# Patient Record
Sex: Female | Born: 1955 | Race: Black or African American | Hispanic: No | Smoking: Never smoker
Health system: Southern US, Community
[De-identification: ages and names within clinical notes are randomized; demographics above are authoritative.]

## PROBLEM LIST (undated history)

## (undated) DIAGNOSIS — D563 Thalassemia minor: Secondary | ICD-10-CM

## (undated) DIAGNOSIS — I10 Essential (primary) hypertension: Secondary | ICD-10-CM

## (undated) DIAGNOSIS — R109 Unspecified abdominal pain: Secondary | ICD-10-CM

## (undated) DIAGNOSIS — E785 Hyperlipidemia, unspecified: Secondary | ICD-10-CM

## (undated) DIAGNOSIS — D631 Anemia in chronic kidney disease: Secondary | ICD-10-CM

## (undated) HISTORY — DX: Essential (primary) hypertension: I10

## (undated) HISTORY — PX: COLONOSCOPY: SHX174

## (undated) HISTORY — DX: Thalassemia minor: D56.3

## (undated) HISTORY — DX: Anemia in chronic kidney disease: D63.1

---

## 2000-12-08 ENCOUNTER — Emergency Department (HOSPITAL_COMMUNITY): Admission: EM | Admit: 2000-12-08 | Discharge: 2000-12-08 | Payer: Self-pay | Admitting: Emergency Medicine

## 2008-04-08 ENCOUNTER — Ambulatory Visit (HOSPITAL_COMMUNITY): Admission: RE | Admit: 2008-04-08 | Discharge: 2008-04-08 | Payer: Self-pay | Admitting: *Deleted

## 2008-04-08 ENCOUNTER — Encounter (INDEPENDENT_AMBULATORY_CARE_PROVIDER_SITE_OTHER): Payer: Self-pay | Admitting: *Deleted

## 2008-04-15 ENCOUNTER — Other Ambulatory Visit: Admission: RE | Admit: 2008-04-15 | Discharge: 2008-04-15 | Payer: Self-pay | Admitting: Internal Medicine

## 2011-04-30 NOTE — Op Note (Signed)
NAMEMARETTA, [name] NO.:  [id number]   MEDICAL RECORD NO.:  [id number]          PATIENT TYPE:  AMB   LOCATION:  ENDO                         FACILITY:  [facility]   PHYSICIAN:  Waverly Ferrari, M.D.    DATE OF BIRTH:  [birth year]   DATE OF PROCEDURE:  04/08/2008  DATE OF DISCHARGE:                               OPERATIVE REPORT   PROCEDURE:  Colonoscopy.   ENDOSCOPIST:  Waverly Ferrari, M.D.   INDICATIONS:  Colon polyp, colon cancer screening.   ANESTHESIA:  Fentanyl 75 mcg, Versed 6 mg.   PROCEDURE:  With the patient mildly sedated in the left lateral  decubitus position, the Pentax videoscopic colonoscope was inserted in  the rectum and passed under direct vision with pressure applied to reach  the cecum, identified by ileocecal valve and appendiceal orifice, both  which were photographed.  From this point, the colonoscope was slowly  withdrawn, taking circumferential views of the colonic mucosa, stopping  in the rectum, where a polyp was seen, photographed and removed using  snare cautery technique, setting of 20/150 blended current, and it was  suctioned through the endoscope and withdrawn and placed in a container.  The endoscope was reinserted and placed in retroflex view to view the  anal canal from above.  The endoscope was straightened and withdrawn.  The patient's vital signs and pulse oximetry remained stable.  The  patient tolerated the procedure well without apparent complications.   FINDINGS:  Polyp of rectum and internal hemorrhoids, otherwise an  unremarkable examination.   PLAN:  Await biopsy report.  The patient will call me for results and  follow up with me as an outpatient.           ______________________________  Waverly Ferrari, M.D.     GMO/MEDQ  D:  04/08/2008  T:  04/08/2008  Job:  [id number]

## 2012-10-08 ENCOUNTER — Encounter: Payer: Self-pay | Admitting: Sports Medicine

## 2012-10-08 ENCOUNTER — Ambulatory Visit (INDEPENDENT_AMBULATORY_CARE_PROVIDER_SITE_OTHER): Payer: 59 | Admitting: Sports Medicine

## 2012-10-08 VITALS — BP 230/120 | HR 79 | Wt 171.0 lb

## 2012-10-08 DIAGNOSIS — I10 Essential (primary) hypertension: Secondary | ICD-10-CM | POA: Insufficient documentation

## 2012-10-08 DIAGNOSIS — D509 Iron deficiency anemia, unspecified: Secondary | ICD-10-CM

## 2012-10-08 DIAGNOSIS — Z0000 Encounter for general adult medical examination without abnormal findings: Secondary | ICD-10-CM | POA: Insufficient documentation

## 2012-10-08 DIAGNOSIS — Z298 Encounter for other specified prophylactic measures: Secondary | ICD-10-CM

## 2012-10-08 DIAGNOSIS — Z23 Encounter for immunization: Secondary | ICD-10-CM

## 2012-10-08 DIAGNOSIS — E785 Hyperlipidemia, unspecified: Secondary | ICD-10-CM

## 2012-10-08 DIAGNOSIS — Z299 Encounter for prophylactic measures, unspecified: Secondary | ICD-10-CM

## 2012-10-08 MED ORDER — LISINOPRIL-HYDROCHLOROTHIAZIDE 20-25 MG PO TABS: 1.0000 | ORAL_TABLET | Freq: Every day | ORAL | Status: DC

## 2012-10-08 MED ORDER — ASPIRIN EC 81 MG PO TBEC: 81.0000 mg | DELAYED_RELEASE_TABLET | Freq: Every day | ORAL | Status: DC

## 2012-10-08 NOTE — Progress Notes (Addendum)
Subjective:    CC: Establish care.   HPI: This is an exquisitely pleasant 56 year old female who I met at the [place] [facility] today. I measured her blood pressure at 230/160. She had no headache, visual changes, chest pain, shortness of breath, nausea.  Cervical cancer screening: Due for a Pap, would like a female provider to perform this.  Colon cancer screening: Colonoscopy 2 years ago, single polyp found, 5 year followup recommended.  Breast cancer screening: Due for a mammogram.  Past medical history, Surgical history, Family history, Social history, Allergies, and medications have been entered into the medical record, reviewed, and no changes needed.   Review of Systems: No headache, visual changes, nausea, vomiting, diarrhea, constipation, dizziness, abdominal pain, skin rash, fevers, chills, night sweats, swollen lymph nodes, weight loss, chest pain, body aches, joint swelling, muscle aches, or shortness of breath.   Objective:    General: Well Developed, well nourished, and in no acute distress.  Neuro: Alert and oriented x3, extra-ocular muscles intact.  HEENT: Normocephalic, atraumatic, pupils equal round reactive to light, neck supple, no masses, no lymphadenopathy, thyroid nonpalpable.  Skin: Warm and dry, no rashes noted.  Cardiac: Regular rate and rhythm, no murmurs rubs.  S4 gallop present.  2+ pitting edema present in both lower extremities, symmetric. Respiratory: Clear to auscultation bilaterally. Not using accessory muscles, speaking in full sentences.  Abdominal: Soft, nontender, nondistended, positive bowel sounds, no masses, no organomegaly.  Musculoskeletal: Shoulder, elbow, wrist, hip, knee, ankle stable, and with full range of motion.  Impression and Recommendations:

## 2012-10-08 NOTE — Assessment & Plan Note (Signed)
Declines flu shot, Tdap given today. We'll set her up for mammogram, Pap smear with my partner Iran Planas, PA-C. Up-to-date on colonoscopy, done 2 years ago with a recommend five-year followup.

## 2012-10-08 NOTE — Assessment & Plan Note (Signed)
Highly elevated, but no signs or symptoms of endorgan damage. This makes this a hypertensive urgency, and not an emergency. We'll start lisinopril/hydrochlorothiazide combo. Aspirin daily. Checking CBC, CMET, TSH, lipids.

## 2012-10-13 ENCOUNTER — Ambulatory Visit (INDEPENDENT_AMBULATORY_CARE_PROVIDER_SITE_OTHER): Payer: 59

## 2012-10-13 DIAGNOSIS — Z1231 Encounter for screening mammogram for malignant neoplasm of breast: Secondary | ICD-10-CM

## 2012-10-13 DIAGNOSIS — R928 Other abnormal and inconclusive findings on diagnostic imaging of breast: Secondary | ICD-10-CM

## 2012-10-16 ENCOUNTER — Other Ambulatory Visit: Payer: Self-pay | Admitting: Sports Medicine

## 2012-10-16 ENCOUNTER — Encounter: Payer: Self-pay | Admitting: Sports Medicine

## 2012-10-16 ENCOUNTER — Ambulatory Visit (INDEPENDENT_AMBULATORY_CARE_PROVIDER_SITE_OTHER): Payer: 59 | Admitting: Sports Medicine

## 2012-10-16 ENCOUNTER — Encounter: Payer: Self-pay | Admitting: *Deleted

## 2012-10-16 VITALS — BP 190/103 | HR 72 | Wt 174.0 lb

## 2012-10-16 DIAGNOSIS — I10 Essential (primary) hypertension: Secondary | ICD-10-CM

## 2012-10-16 DIAGNOSIS — R928 Other abnormal and inconclusive findings on diagnostic imaging of breast: Secondary | ICD-10-CM

## 2012-10-16 DIAGNOSIS — N63 Unspecified lump in unspecified breast: Secondary | ICD-10-CM | POA: Insufficient documentation

## 2012-10-16 MED ORDER — AMLODIPINE BESYLATE 5 MG PO TABS: 5.0000 mg | ORAL_TABLET | Freq: Every day | ORAL | Status: DC

## 2012-10-16 NOTE — Assessment & Plan Note (Signed)
We will ensure that followup imaging will be performed.

## 2012-10-16 NOTE — Assessment & Plan Note (Signed)
Improved, and medicines not yet been taken today. We still need to add amlodipine, we'll start at 5 mg. Return to clinic in 2 weeks.

## 2012-10-16 NOTE — Progress Notes (Signed)
Subjective:    CC: Followup  HPI: Hypertension: Has been on lisinopril/hydrochlorothiazide for 2 weeks, blood pressure has improved significantly from 123456 systolic to 99991111 systolic, and she's not yet taken her medicine this morning.  She remains asymptomatic.  Left breast mass: Noted on screening mammogram, patient tells [place] that she's not been made aware of this, and has not yet had followup testing set up.  Past medical history, Surgical history, Family history, Social history, Allergies, and medications have been entered into the medical record, reviewed, and no changes needed.   Review of Systems: No fevers, chills, night sweats, weight loss, chest pain, or shortness of breath.   Objective:    General: Well Developed, well nourished, and in no acute distress.  Neuro: Alert and oriented x3, extra-ocular muscles intact.  HEENT: Normocephalic, atraumatic, pupils equal round reactive to light, neck supple, no masses, no lymphadenopathy, thyroid nonpalpable.  Skin: Warm and dry, no rashes. Cardiac: Regular rate and rhythm, no murmurs rubs or gallops.  Respiratory: Clear to auscultation bilaterally. Not using accessory muscles, speaking in full sentences. Still has 1+ pitting edema in the lower extremities which is significantly improved from before.  Impression and Recommendations:

## 2012-10-17 LAB — COMPREHENSIVE METABOLIC PANEL WITH GFR
ALT: <8 U/L (ref 0–35)
AST: 15 U/L (ref 0–37)
Alkaline Phosphatase: 85 U/L (ref 39–117)
Sodium: 140 meq/L (ref 135–145)
Total Bilirubin: 0.3 mg/dL (ref 0.3–1.2)
Total Protein: 7 g/dL (ref 6.0–8.3)

## 2012-10-17 LAB — COMPREHENSIVE METABOLIC PANEL
Albumin: 3.7 g/dL (ref 3.5–5.2)
BUN: 21 mg/dL (ref 6–23)
CO2: 31 mEq/L (ref 19–32)
Calcium: 9.4 mg/dL (ref 8.4–10.5)
Chloride: 101 mEq/L (ref 96–112)
Creat: 0.79 mg/dL (ref 0.50–1.10)
Glucose, Bld: 86 mg/dL (ref 70–99)
Potassium: 3.7 mEq/L (ref 3.5–5.3)

## 2012-10-17 LAB — LIPID PANEL
Cholesterol: 210 mg/dL — ABNORMAL HIGH (ref 0–200)
HDL: 41 mg/dL (ref >39)
LDL Cholesterol: 153 mg/dL — ABNORMAL HIGH (ref 0–99)
Total CHOL/HDL Ratio: 5.1 Ratio
Triglycerides: 81 mg/dL (ref <150)
VLDL: 16 mg/dL (ref 0–40)

## 2012-10-17 LAB — CBC
HCT: 32.8 % — ABNORMAL LOW (ref 36.0–46.0)
Hemoglobin: 10.4 g/dL — ABNORMAL LOW (ref 12.0–15.0)
MCH: 21.8 pg — ABNORMAL LOW (ref 26.0–34.0)
MCHC: 31.7 g/dL (ref 30.0–36.0)
MCV: 68.8 fL — ABNORMAL LOW (ref 78.0–100.0)
Platelets: 309 K/uL (ref 150–400)
RBC: 4.77 MIL/uL (ref 3.87–5.11)
RDW: 14.4 % (ref 11.5–15.5)
WBC: 9 10*3/uL (ref 4.0–10.5)

## 2012-10-17 LAB — TSH: TSH: 2.075 u[IU]/mL (ref 0.350–4.500)

## 2012-10-18 DIAGNOSIS — E785 Hyperlipidemia, unspecified: Secondary | ICD-10-CM | POA: Insufficient documentation

## 2012-10-18 DIAGNOSIS — D509 Iron deficiency anemia, unspecified: Secondary | ICD-10-CM | POA: Insufficient documentation

## 2012-10-18 MED ORDER — ATORVASTATIN CALCIUM 40 MG PO TABS: 40.0000 mg | ORAL_TABLET | Freq: Every day | ORAL | Status: DC

## 2012-10-18 MED ORDER — FERROUS SULFATE 325 (65 FE) MG PO TBEC: 325.0000 mg | DELAYED_RELEASE_TABLET | Freq: Three times a day (TID) | ORAL | Status: DC

## 2012-10-18 NOTE — Addendum Note (Signed)
Addended by: Silverio Decamp on: 10/18/2012 09:52 AM   Modules accepted: Orders

## 2012-10-18 NOTE — Assessment & Plan Note (Signed)
Starting lipitor 40. Recheck in 6 weeks.

## 2012-10-18 NOTE — Assessment & Plan Note (Signed)
Will set up repeat colonoscopy 2/2 iron def anemia in a postmenopausal female.

## 2012-10-22 ENCOUNTER — Other Ambulatory Visit: Payer: 59

## 2012-10-26 ENCOUNTER — Other Ambulatory Visit: Payer: Self-pay | Admitting: Internal Medicine

## 2012-10-30 ENCOUNTER — Ambulatory Visit: Payer: 59 | Admitting: Sports Medicine

## 2012-10-30 DIAGNOSIS — Z0289 Encounter for other administrative examinations: Secondary | ICD-10-CM

## 2012-11-02 ENCOUNTER — Encounter: Payer: 59 | Admitting: Physician Assistant

## 2012-11-03 ENCOUNTER — Encounter: Payer: Self-pay | Admitting: Sports Medicine

## 2012-11-03 ENCOUNTER — Ambulatory Visit
Admission: RE | Admit: 2012-11-03 | Discharge: 2012-11-03 | Disposition: A | Discharge status: Home / Self Care | Payer: 59 | Source: Ambulatory Visit | Attending: Sports Medicine | Admitting: Sports Medicine

## 2012-11-03 DIAGNOSIS — R928 Other abnormal and inconclusive findings on diagnostic imaging of breast: Secondary | ICD-10-CM

## 2012-11-09 ENCOUNTER — Encounter: Payer: Self-pay | Admitting: Physician Assistant

## 2012-11-09 ENCOUNTER — Ambulatory Visit (INDEPENDENT_AMBULATORY_CARE_PROVIDER_SITE_OTHER): Payer: 59 | Admitting: Physician Assistant

## 2012-11-09 ENCOUNTER — Other Ambulatory Visit (HOSPITAL_COMMUNITY)
Admission: RE | Admit: 2012-11-09 | Discharge: 2012-11-09 | Disposition: A | Discharge status: Home / Self Care | Payer: 59 | Source: Ambulatory Visit | Attending: Sports Medicine | Admitting: Sports Medicine

## 2012-11-09 VITALS — BP 149/88 | HR 77 | Wt 169.0 lb

## 2012-11-09 DIAGNOSIS — I10 Essential (primary) hypertension: Secondary | ICD-10-CM

## 2012-11-09 DIAGNOSIS — Z01419 Encounter for gynecological examination (general) (routine) without abnormal findings: Secondary | ICD-10-CM

## 2012-11-09 NOTE — Progress Notes (Signed)
  Subjective:    Patient ID: [name], female    DOB: [birth year], 56 y.o.   MRN: [id number]  HPI    Review of Systems     Objective:   Physical Exam        Assessment & Plan:   Subjective:     [name] is a 56 y.o. female and is here for a comprehensive physical exam. The patient reports no problems.  History   Social History  . Marital Status: Divorced    Spouse Name: N/A    Number of Children: N/A  . Years of Education: N/A   Occupational History  . Not on file.   Social History Main Topics  . Smoking status: Never Smoker   . Smokeless tobacco: Not on file  . Alcohol Use: No  . Drug Use: No  . Sexually Active: No   Other Topics Concern  . Not on file   Social History Narrative  . No narrative on file   Health Maintenance  Topic Date Due  . Pap Smear  02/17/1972  . Mammogram  05/03/2013  . Colonoscopy  12/16/2014  . Tetanus/tdap  10/08/2022    The following portions of the patient's history were reviewed and updated as appropriate: allergies, current medications, past family history, past medical history, past social history, past surgical history and problem list.  Review of Systems A comprehensive review of systems was negative.   Objective:    BP 149/88  Pulse 77  Wt 169 lb (76.658 kg) General appearance: alert, cooperative and appears stated age Head: Normocephalic, without obvious abnormality, atraumatic Eyes: conjunctivae/corneas clear. PERRL, EOM's intact. Fundi benign. Ears: normal TM's and external ear canals both ears Nose: Nares normal. Septum midline. Mucosa normal. No drainage or sinus tenderness. Throat: lips, mucosa, and tongue normal; teeth and gums normal Neck: no adenopathy, no carotid bruit, no JVD, supple, symmetrical, trachea midline and thyroid not enlarged, symmetric, no tenderness/mass/nodules Back: symmetric, no curvature. ROM normal. No CVA tenderness. Lungs: clear to auscultation bilaterally Heart: regular rate  and rhythm, S1, S2 normal, no murmur, click, rub or gallop Abdomen: soft, non-tender; bowel sounds normal; no masses,  no organomegaly Pelvic: cervix normal in appearance, external genitalia normal, no adnexal masses or tenderness, no cervical motion tenderness, uterus normal size, shape, and consistency and vagina normal without discharge Extremities: extremities normal, atraumatic, no cyanosis or edema Pulses: 2+ and symmetric Skin: Skin color, texture, turgor normal. No rashes or lesions Lymph nodes: Cervical, supraclavicular, and axillary nodes normal. Neurologic: Grossly normal    Assessment:    Healthy female exam.      Plan:    CPE/Pap/HTN- All vaccines up to date. Declines flu shot. Labs have already been done and being followed by [name]. BP continues to be high but much better. Increased patient to 1 and 1/2 tab of norvasc daily(7.5mg ). Follow up with [name] in 4 weeks. Reminder to stay on a low salt diet and regular exercise. Encouraged calcium daily 4 servings or 500mg  BID. Pap done today and will call patient with results.  See After Visit Summary for Counseling Recommendations

## 2012-11-09 NOTE — Patient Instructions (Addendum)
Increase Norvasc to 1 and 1/2 tab daily. Follow up in 4 weeks for blood pressure check with Dr. Darene Lamer.   Will call with pap results.

## 2012-11-10 ENCOUNTER — Telehealth: Payer: Self-pay

## 2012-11-10 NOTE — Telephone Encounter (Signed)
Called patient and gave her the number to [facility]. Advised her to call and schedule an appointment with them for consult/colonoscopy. She voiced understanding.

## 2012-12-07 ENCOUNTER — Ambulatory Visit (INDEPENDENT_AMBULATORY_CARE_PROVIDER_SITE_OTHER): Payer: 59 | Admitting: Sports Medicine

## 2012-12-07 ENCOUNTER — Encounter: Payer: Self-pay | Admitting: Sports Medicine

## 2012-12-07 VITALS — BP 149/95 | HR 78 | Wt 167.0 lb

## 2012-12-07 DIAGNOSIS — N63 Unspecified lump in unspecified breast: Secondary | ICD-10-CM

## 2012-12-07 DIAGNOSIS — I10 Essential (primary) hypertension: Secondary | ICD-10-CM

## 2012-12-07 DIAGNOSIS — Z299 Encounter for prophylactic measures, unspecified: Secondary | ICD-10-CM

## 2012-12-07 DIAGNOSIS — E785 Hyperlipidemia, unspecified: Secondary | ICD-10-CM

## 2012-12-07 MED ORDER — AMLODIPINE BESYLATE 10 MG PO TABS: 10.0000 mg | ORAL_TABLET | Freq: Every day | ORAL | Status: DC

## 2012-12-07 NOTE — Assessment & Plan Note (Signed)
Up-to-date on colonoscopy, Pap smear.

## 2012-12-07 NOTE — Assessment & Plan Note (Signed)
She has been on atorvastatin for 6 weeks at least. It is time to recheck lipids, and adjust medicine as needed.

## 2012-12-07 NOTE — Assessment & Plan Note (Signed)
As above and overview, needs repeat ultrasound in 6 months, this will be May 2014

## 2012-12-07 NOTE — Assessment & Plan Note (Signed)
Much improved. I am going to increase Norvasc to 10 mg daily.

## 2012-12-07 NOTE — Progress Notes (Signed)
Subjective:    CC: Followup  HPI: Hypertension: Currently on lisinopril/hydrochlorothiazide at the max dose, she is also taking amlodipine at the 7.5 mg dose.  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  Colon cancer screening: Does have a followup visit with her gastroenterologist coming up.  Hyperlipidemia: Doing well with atorvastatin, no adverse effects.  Past medical history, Surgical history, Family history, Social history, Allergies, and medications have been entered into the medical record, reviewed, and no changes needed.   Review of Systems: No fevers, chills, night sweats, weight loss, chest pain, or shortness of breath.   Objective:    General: Well Developed, well nourished, and in no acute distress.  Neuro: Alert and oriented x3, extra-ocular muscles intact.  HEENT: Normocephalic, atraumatic, pupils equal round reactive to light, neck supple, no masses, no lymphadenopathy, thyroid nonpalpable.  Skin: Warm and dry, no rashes. Cardiac: Regular rate and rhythm, no murmurs rubs or gallops.  Respiratory: Clear to auscultation bilaterally. Not using accessory muscles, speaking in full sentences.   Impression and Recommendations:

## 2012-12-21 ENCOUNTER — Ambulatory Visit: Payer: 59 | Admitting: Sports Medicine

## 2013-02-18 ENCOUNTER — Ambulatory Visit (INDEPENDENT_AMBULATORY_CARE_PROVIDER_SITE_OTHER): Payer: 59 | Admitting: Sports Medicine

## 2013-02-18 ENCOUNTER — Ambulatory Visit (HOSPITAL_BASED_OUTPATIENT_CLINIC_OR_DEPARTMENT_OTHER)
Admission: RE | Admit: 2013-02-18 | Discharge: 2013-02-18 | Disposition: A | Discharge status: Home / Self Care | Payer: 59 | Source: Ambulatory Visit | Attending: Sports Medicine | Admitting: Sports Medicine

## 2013-02-18 ENCOUNTER — Encounter: Payer: Self-pay | Admitting: Sports Medicine

## 2013-02-18 ENCOUNTER — Ambulatory Visit: Payer: 59

## 2013-02-18 DIAGNOSIS — M25539 Pain in unspecified wrist: Secondary | ICD-10-CM | POA: Insufficient documentation

## 2013-02-18 DIAGNOSIS — M546 Pain in thoracic spine: Secondary | ICD-10-CM | POA: Insufficient documentation

## 2013-02-18 DIAGNOSIS — IMO0002 Reserved for concepts with insufficient information to code with codable children: Secondary | ICD-10-CM

## 2013-02-18 DIAGNOSIS — I10 Essential (primary) hypertension: Secondary | ICD-10-CM

## 2013-02-18 DIAGNOSIS — M25519 Pain in unspecified shoulder: Secondary | ICD-10-CM | POA: Insufficient documentation

## 2013-02-18 DIAGNOSIS — S60219A Contusion of unspecified wrist, initial encounter: Secondary | ICD-10-CM

## 2013-02-18 MED ORDER — MELOXICAM 15 MG PO TABS: ORAL_TABLET | ORAL | Status: DC

## 2013-02-18 NOTE — Assessment & Plan Note (Signed)
We will revisit this when not in pain.
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
## 2013-03-01 ENCOUNTER — Telehealth: Payer: Self-pay

## 2013-03-01 NOTE — Telephone Encounter (Signed)
CORRENA UMEDA wants [place] to fax the x-ray report and office note to [employer].  Faxed records.

## 2013-03-04 ENCOUNTER — Encounter: Payer: Self-pay | Admitting: Sports Medicine

## 2013-03-04 ENCOUNTER — Ambulatory Visit (INDEPENDENT_AMBULATORY_CARE_PROVIDER_SITE_OTHER): Payer: 59 | Admitting: Sports Medicine

## 2013-03-04 VITALS — BP 113/72 | HR 89 | Wt 160.0 lb

## 2013-03-04 DIAGNOSIS — N63 Unspecified lump in unspecified breast: Secondary | ICD-10-CM

## 2013-03-04 DIAGNOSIS — I10 Essential (primary) hypertension: Secondary | ICD-10-CM

## 2013-03-04 DIAGNOSIS — E785 Hyperlipidemia, unspecified: Secondary | ICD-10-CM

## 2013-03-04 MED ORDER — ASPIRIN EC 81 MG PO TBEC: 81.0000 mg | DELAYED_RELEASE_TABLET | Freq: Every day | ORAL | Status: DC

## 2013-03-04 MED ORDER — ATORVASTATIN CALCIUM 40 MG PO TABS: 40.0000 mg | ORAL_TABLET | Freq: Every day | ORAL | Status: DC

## 2013-03-04 MED ORDER — LISINOPRIL-HYDROCHLOROTHIAZIDE 20-25 MG PO TABS: 1.0000 | ORAL_TABLET | Freq: Every day | ORAL | Status: DC

## 2013-03-04 MED ORDER — AMLODIPINE BESYLATE 10 MG PO TABS: 10.0000 mg | ORAL_TABLET | Freq: Every day | ORAL | Status: DC

## 2013-03-04 NOTE — Assessment & Plan Note (Signed)
Pain has resolved.

## 2013-03-04 NOTE — Assessment & Plan Note (Signed)
Beautifully controlled. Refilling with three-month supplies. Return in 3-4 months.
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
## 2013-03-04 NOTE — Assessment & Plan Note (Signed)
Stable, needs repeat ultrasound May 2014.

## 2013-03-04 NOTE — Assessment & Plan Note (Signed)
Currently on high-dose statin. Per new recommendations, no need to recheck lipids.

## 2013-06-03 ENCOUNTER — Other Ambulatory Visit: Payer: Self-pay

## 2013-06-03 ENCOUNTER — Other Ambulatory Visit: Payer: Self-pay | Admitting: Sports Medicine

## 2013-06-03 DIAGNOSIS — N63 Unspecified lump in unspecified breast: Secondary | ICD-10-CM

## 2013-06-04 ENCOUNTER — Ambulatory Visit: Payer: 59 | Admitting: Sports Medicine

## 2013-06-17 ENCOUNTER — Other Ambulatory Visit: Payer: 59

## 2013-06-21 ENCOUNTER — Other Ambulatory Visit: Payer: 59

## 2013-06-22 ENCOUNTER — Ambulatory Visit
Admission: RE | Admit: 2013-06-22 | Discharge: 2013-06-22 | Disposition: A | Discharge status: Home / Self Care | Payer: 59 | Source: Ambulatory Visit | Attending: Sports Medicine | Admitting: Sports Medicine

## 2013-06-22 DIAGNOSIS — N63 Unspecified lump in unspecified breast: Secondary | ICD-10-CM

## 2013-09-01 ENCOUNTER — Other Ambulatory Visit (HOSPITAL_COMMUNITY): Payer: Self-pay | Admitting: Orthopedic Surgery

## 2013-09-01 DIAGNOSIS — M25531 Pain in right wrist: Secondary | ICD-10-CM

## 2013-09-08 ENCOUNTER — Encounter (HOSPITAL_COMMUNITY): Payer: Self-pay

## 2013-09-08 ENCOUNTER — Encounter (HOSPITAL_COMMUNITY)
Admission: RE | Admit: 2013-09-08 | Discharge: 2013-09-08 | Disposition: A | Discharge status: Home / Self Care | Payer: 59 | Source: Ambulatory Visit | Attending: Orthopedic Surgery | Admitting: Orthopedic Surgery

## 2013-09-08 ENCOUNTER — Encounter (HOSPITAL_COMMUNITY): Payer: 59

## 2013-09-08 DIAGNOSIS — M25531 Pain in right wrist: Secondary | ICD-10-CM

## 2013-09-08 DIAGNOSIS — M25539 Pain in unspecified wrist: Secondary | ICD-10-CM | POA: Insufficient documentation

## 2013-09-08 MED ORDER — TECHNETIUM TC 99M MEDRONATE IV KIT
25.0000 | PACK | Freq: Once | INTRAVENOUS | Status: AC | PRN
  Administered 2013-09-08: 25 via INTRAVENOUS

## 2013-12-29 ENCOUNTER — Other Ambulatory Visit: Payer: Self-pay | Admitting: Orthopedic Surgery

## 2013-12-29 DIAGNOSIS — M79646 Pain in unspecified finger(s): Secondary | ICD-10-CM

## 2014-01-06 ENCOUNTER — Other Ambulatory Visit: Payer: 59

## 2014-01-14 ENCOUNTER — Other Ambulatory Visit: Payer: 59

## 2014-01-16 ENCOUNTER — Ambulatory Visit
Admission: RE | Admit: 2014-01-16 | Discharge: 2014-01-16 | Disposition: A | Discharge status: Home / Self Care | Payer: 59 | Source: Ambulatory Visit | Attending: Orthopedic Surgery | Admitting: Orthopedic Surgery

## 2014-01-16 DIAGNOSIS — M79646 Pain in unspecified finger(s): Secondary | ICD-10-CM

## 2014-01-26 ENCOUNTER — Other Ambulatory Visit: Payer: Self-pay | Admitting: Sports Medicine

## 2014-01-26 DIAGNOSIS — N6320 Unspecified lump in the left breast, unspecified quadrant: Secondary | ICD-10-CM

## 2014-02-08 ENCOUNTER — Other Ambulatory Visit: Payer: 59

## 2014-02-18 ENCOUNTER — Ambulatory Visit
Admission: RE | Admit: 2014-02-18 | Discharge: 2014-02-18 | Disposition: A | Discharge status: Home / Self Care | Payer: 59 | Source: Ambulatory Visit | Attending: Sports Medicine | Admitting: Sports Medicine

## 2014-02-18 DIAGNOSIS — N6320 Unspecified lump in the left breast, unspecified quadrant: Secondary | ICD-10-CM

## 2014-09-21 ENCOUNTER — Other Ambulatory Visit: Payer: Self-pay | Admitting: Sports Medicine

## 2015-03-31 ENCOUNTER — Other Ambulatory Visit: Payer: Self-pay | Admitting: Sports Medicine

## 2016-01-26 ENCOUNTER — Encounter (HOSPITAL_COMMUNITY): Payer: Self-pay

## 2016-01-26 ENCOUNTER — Emergency Department (HOSPITAL_COMMUNITY)
Admission: EM | Admit: 2016-01-26 | Discharge: 2016-01-26 | Disposition: A | Discharge status: Home / Self Care | Payer: 59 | Attending: Emergency Medicine | Admitting: Emergency Medicine

## 2016-01-26 DIAGNOSIS — E785 Hyperlipidemia, unspecified: Secondary | ICD-10-CM | POA: Insufficient documentation

## 2016-01-26 DIAGNOSIS — IMO0001 Reserved for inherently not codable concepts without codable children: Secondary | ICD-10-CM

## 2016-01-26 DIAGNOSIS — R040 Epistaxis: Secondary | ICD-10-CM | POA: Diagnosis not present

## 2016-01-26 DIAGNOSIS — R519 Headache, unspecified: Secondary | ICD-10-CM

## 2016-01-26 DIAGNOSIS — R51 Headache: Secondary | ICD-10-CM | POA: Insufficient documentation

## 2016-01-26 DIAGNOSIS — R030 Elevated blood-pressure reading, without diagnosis of hypertension: Secondary | ICD-10-CM

## 2016-01-26 DIAGNOSIS — R110 Nausea: Secondary | ICD-10-CM | POA: Diagnosis not present

## 2016-01-26 DIAGNOSIS — Z79899 Other long term (current) drug therapy: Secondary | ICD-10-CM | POA: Insufficient documentation

## 2016-01-26 DIAGNOSIS — I10 Essential (primary) hypertension: Secondary | ICD-10-CM | POA: Diagnosis not present

## 2016-01-26 HISTORY — DX: Hyperlipidemia, unspecified: E78.5

## 2016-01-26 LAB — URINE MICROSCOPIC-ADD ON

## 2016-01-26 LAB — URINALYSIS, ROUTINE W REFLEX MICROSCOPIC
BILIRUBIN URINE: NEGATIVE
Glucose, UA: NEGATIVE mg/dL
KETONES UR: NEGATIVE mg/dL
NITRITE: NEGATIVE
PROTEIN: NEGATIVE mg/dL
Specific Gravity, Urine: 1.003 — ABNORMAL LOW (ref 1.005–1.030)
pH: 6.5 (ref 5.0–8.0)

## 2016-01-26 MED ORDER — LISINOPRIL-HYDROCHLOROTHIAZIDE 20-25 MG PO TABS: 1.0000 | ORAL_TABLET | Freq: Every day | ORAL | Status: DC

## 2016-01-26 MED ORDER — ACETAMINOPHEN 325 MG PO TABS
650.0000 mg | ORAL_TABLET | Freq: Once | ORAL | Status: AC
  Administered 2016-01-26: 650 mg via ORAL
  Filled 2016-01-26: qty 2

## 2016-01-26 MED ORDER — DIPHENHYDRAMINE HCL 25 MG PO CAPS
25.0000 mg | ORAL_CAPSULE | Freq: Once | ORAL | Status: AC
  Administered 2016-01-26: 25 mg via ORAL
  Filled 2016-01-26: qty 1

## 2016-01-26 MED ORDER — AMLODIPINE BESYLATE 5 MG PO TABS
10.0000 mg | ORAL_TABLET | Freq: Once | ORAL | Status: AC
  Administered 2016-01-26: 10 mg via ORAL
  Filled 2016-01-26: qty 2

## 2016-01-26 MED ORDER — LISINOPRIL 20 MG PO TABS
20.0000 mg | ORAL_TABLET | Freq: Once | ORAL | Status: AC
  Administered 2016-01-26: 20 mg via ORAL
  Filled 2016-01-26: qty 1

## 2016-01-26 MED ORDER — METOCLOPRAMIDE HCL 5 MG/ML IJ SOLN
5.0000 mg | Freq: Once | INTRAMUSCULAR | Status: AC
  Administered 2016-01-26: 5 mg via INTRAMUSCULAR
  Filled 2016-01-26: qty 2

## 2016-01-26 MED ORDER — IBUPROFEN 800 MG PO TABS
800.0000 mg | ORAL_TABLET | Freq: Once | ORAL | Status: AC
  Administered 2016-01-26: 800 mg via ORAL
  Filled 2016-01-26: qty 1

## 2016-01-26 MED ORDER — HYDROCHLOROTHIAZIDE 25 MG PO TABS
25.0000 mg | ORAL_TABLET | Freq: Once | ORAL | Status: AC
  Administered 2016-01-26: 25 mg via ORAL
  Filled 2016-01-26: qty 1

## 2016-01-26 MED ORDER — ONDANSETRON 4 MG PO TBDP
8.0000 mg | ORAL_TABLET | Freq: Once | ORAL | Status: AC
  Administered 2016-01-26: 8 mg via ORAL
  Filled 2016-01-26: qty 2

## 2016-01-26 MED ORDER — AMLODIPINE BESYLATE 10 MG PO TABS: 10.0000 mg | ORAL_TABLET | Freq: Every day | ORAL | Status: DC

## 2016-01-26 NOTE — ED Provider Notes (Signed)
CSN: [id number]     Arrival date & time 01/26/16  [id number] History   First MD Initiated Contact with Patient 01/26/16 [phone]     Chief Complaint  Patient presents with  . Headache  . Epistaxis  . Nausea     (Consider location/radiation/quality/duration/timing/severity/associated sxs/prior Treatment) HPI   [name] is a 60 y.o. female with PMH significant for HTN and HLD who presents with elevated BP, headache, and epistaxis x 1 day.  Patient reports she has been out of her blood pressure medications (amlodipine 10 mg and lisinopril-HCTZ 20-25mg ) for the past month.  Patient complains of gradual onset, constant, throbbing, generalized headache.  No meds PTA.  No head injury/trauma.  Aggravating factors include bending over.  Denies CP, SOB, abdominal pain, fever, vomiting, urinary symptoms, slurred speech, facial droop, unilateral weakness, or photophobia.    Past Medical History  Diagnosis Date  . Hypertension   . Hyperlipidemia    History reviewed. No pertinent past surgical history. Family History  Problem Relation Age of Onset  . Stroke Mother   . Hyperlipidemia Mother   . Diabetes Mother    Social History  Substance Use Topics  . Smoking status: Never Smoker   . Smokeless tobacco: None  . Alcohol Use: No   OB History    Gravida Para Term Preterm AB TAB SAB Ectopic Multiple Living   2 1   1           Review of Systems All other systems negative unless otherwise stated in HPI    Allergies  Review of patient's allergies indicates no known allergies.  Home Medications   Prior to Admission medications   Medication Sig Start Date End Date Taking? Authorizing Provider  amLODipine (NORVASC) 10 MG tablet TAKE ONE TABLET BY MOUTH ONCE DAILY Patient not taking: Reported on 01/26/2016 09/21/14   Silverio Decamp, MD  aspirin EC 81 MG tablet Take 1 tablet (81 mg total) by mouth daily. Patient not taking: Reported on 01/26/2016 03/04/13   Silverio Decamp, MD   atorvastatin (LIPITOR) 40 MG tablet TAKE ONE TABLET BY MOUTH ONCE DAILY Patient not taking: Reported on 01/26/2016 09/21/14   Silverio Decamp, MD  ferrous sulfate 325 (65 FE) MG tablet TAKE ONE TABLET BY MOUTH THREE TIMES DAILY WITH MEALS Patient not taking: Reported on 01/26/2016 03/31/15   Silverio Decamp, MD  lisinopril-hydrochlorothiazide (PRINZIDE,ZESTORETIC) 20-25 MG per tablet Take 1 tablet by mouth daily. Patient not taking: Reported on 01/26/2016 03/04/13   Silverio Decamp, MD  meloxicam (MOBIC) 15 MG tablet One tab PO qAM with breakfast for 2 weeks, then daily prn pain. Patient not taking: Reported on 01/26/2016 02/18/13   Silverio Decamp, MD   BP 164/91 mmHg  Pulse 98  Temp(Src) 97.3 F (36.3 C) (Oral)  Resp 16  Ht 5\' 1"  (1.549 m)  Wt 77.111 kg  BMI 32.14 kg/m2  SpO2 91% Physical Exam  Constitutional: She is oriented to person, place, and time. She appears well-developed and well-nourished.  Non-toxic appearance. She does not have a sickly appearance. She does not appear ill.  HENT:  Head: Normocephalic and atraumatic.  Nose: No epistaxis.  Mouth/Throat: Oropharynx is clear and moist.  No active epistaxis.  Areas of dried blood within nares bilaterally.   Eyes: Conjunctivae are normal. Pupils are equal, round, and reactive to light.  Neck: Normal range of motion. Neck supple.  Cardiovascular: Normal rate, regular rhythm and normal heart sounds.   No murmur heard. Pulmonary/Chest:  Effort normal and breath sounds normal. No accessory muscle usage or stridor. No respiratory distress. She has no wheezes. She has no rhonchi. She has no rales.  Abdominal: Soft. Bowel sounds are normal. She exhibits no distension. There is no tenderness.  Musculoskeletal: Normal range of motion.  Lymphadenopathy:    She has no cervical adenopathy.  Neurological: She is alert and oriented to person, place, and time.  Mental Status:   AOx3.  Speech clear without  dysarthria. Cranial Nerves:  I-not tested  II-PERRLA  III, IV, VI-EOMs intact  V-temporal and masseter strength intact  VII-symmetrical facial movements intact, no facial droop  VIII-hearing grossly intact bilaterally  IX, X-gag intact  XI-strength of sternomastoid and trapezius muscles 5/5  XII-tongue midline Motor:   Good muscle bulk and tone  Strength 5/5 bilaterally in upper and lower extremities   Cerebellar--intact RAMs, finger to nose intact bilaterally.  Gait normal  No pronator drift Sensory:  Intact in upper and lower extremities   Skin: Skin is warm and dry.  Psychiatric: She has a normal mood and affect. Her behavior is normal.    ED Course  Procedures (including critical care time) Labs Review Labs Reviewed  URINALYSIS, ROUTINE W REFLEX MICROSCOPIC (NOT AT [facility]) - Abnormal; Notable for the following:    Color, Urine STRAW (*)    Specific Gravity, Urine 1.003 (*)    Hgb urine dipstick TRACE (*)    Leukocytes, UA SMALL (*)    All other components within normal limits  URINE MICROSCOPIC-ADD ON - Abnormal; Notable for the following:    Squamous Epithelial / LPF 0-5 (*)    Bacteria, UA RARE (*)    All other components within normal limits    Imaging Review No results found. I have personally reviewed and evaluated these images and lab results as part of my medical decision-making.   EKG Interpretation   Date/Time:  Friday January 26 2016 09:11:05 EST Ventricular Rate:  89 PR Interval:  157 QRS Duration: 80 QT Interval:  362 QTC Calculation: 440 R Axis:   65 Text Interpretation:  Sinus rhythm Probable left atrial enlargement  Borderline T wave abnormalities No previous tracing Confirmed by Maryan Rued   MD, WHITNEY ([id number]) on 01/26/2016 9:27:02 AM      MDM   Final diagnoses:  Nausea  Nonintractable headache, unspecified chronicity pattern, unspecified headache type    Patient presents with generalized headache and epistaxis x 1 day.  Patient  reports she has been out of her BP medications for the past month.  No CP, AMS, SOB, abdominal pain, facial droops, slurred speech, unilateral weakness.  No focal neurological deficits.  Doubt ICH.  I suspect this is related to being out of her BP medications.  Will give dose of home BP medications here along with tylenol.  Per ACEP guidelines regarding asymptomatic HTN, no indication for further testing indicated at this time.  Plan to discharge home with BP medication refill.  Patient became nauseous and vomited.  EKG obtained and shows NSR, no acute changes.  Patient given zofran and able to tolerate PO intake.  Patient's headache has improved with reglan, benadryl, ibuprofen.  Follow up PCP.  Discussed return precautions. Case has been discussed with Dr. Maryan Rued who agrees with the above plan for discharge.      Gloriann Loan, PA-C 01/26/16 1143  Gloriann Loan, PA-C 01/26/16 1154  Blanchie Dessert, MD 01/26/16 1531

## 2016-01-26 NOTE — ED Notes (Signed)
Pt verbalizes understanding of instructions. 

## 2016-01-26 NOTE — Discharge Instructions (Signed)
Hypertension Hypertension, commonly called high blood pressure, is when the force of blood pumping through your arteries is too strong. Your arteries are the blood vessels that carry blood from your heart throughout your body. A blood pressure reading consists of a higher number over a lower number, such as 110/72. The higher number (systolic) is the pressure inside your arteries when your heart pumps. The lower number (diastolic) is the pressure inside your arteries when your heart relaxes. Ideally you want your blood pressure below 120/80. Hypertension forces your heart to work harder to pump blood. Your arteries may become narrow or stiff. Having untreated or uncontrolled hypertension can cause heart attack, stroke, kidney disease, and other problems. RISK FACTORS Some risk factors for high blood pressure are controllable. Others are not.  Risk factors you cannot control include:   Race. You may be at higher risk if you are African American.  Age. Risk increases with age.  Gender. Men are at higher risk than women before age 45 years. After age 65, women are at higher risk than men. Risk factors you can control include:  Not getting enough exercise or physical activity.  Being overweight.  Getting too much fat, sugar, calories, or salt in your diet.  Drinking too much alcohol. SIGNS AND SYMPTOMS Hypertension does not usually cause signs or symptoms. Extremely high blood pressure (hypertensive crisis) may cause headache, anxiety, shortness of breath, and nosebleed. DIAGNOSIS To check if you have hypertension, your health care provider will measure your blood pressure while you are seated, with your arm held at the level of your heart. It should be measured at least twice using the same arm. Certain conditions can cause a difference in blood pressure between your right and left arms. A blood pressure reading that is higher than normal on one occasion does not mean that you need treatment. If  it is not clear whether you have high blood pressure, you may be asked to return on a different day to have your blood pressure checked again. Or, you may be asked to monitor your blood pressure at home for 1 or more weeks. TREATMENT Treating high blood pressure includes making lifestyle changes and possibly taking medicine. Living a healthy lifestyle can help lower high blood pressure. You may need to change some of your habits. Lifestyle changes may include:  Following the DASH diet. This diet is high in fruits, vegetables, and whole grains. It is low in salt, red meat, and added sugars.  Keep your sodium intake below 2,300 mg per day.  Getting at least 30-45 minutes of aerobic exercise at least 4 times per week.  Losing weight if necessary.  Not smoking.  Limiting alcoholic beverages.  Learning ways to reduce stress. Your health care provider may prescribe medicine if lifestyle changes are not enough to get your blood pressure under control, and if one of the following is true:  You are 18-59 years of age and your systolic blood pressure is above 140.  You are 60 years of age or older, and your systolic blood pressure is above 150.  Your diastolic blood pressure is above 90.  You have diabetes, and your systolic blood pressure is over 140 or your diastolic blood pressure is over 90.  You have kidney disease and your blood pressure is above 140/90.  You have heart disease and your blood pressure is above 140/90. Your personal target blood pressure may vary depending on your medical conditions, your age, and other factors. HOME CARE INSTRUCTIONS    Have your blood pressure rechecked as directed by your health care provider.   Take medicines only as directed by your health care provider. Follow the directions carefully. Blood pressure medicines must be taken as prescribed. The medicine does not work as well when you skip doses. Skipping doses also puts you at risk for  problems.  Do not smoke.   Monitor your blood pressure at home as directed by your health care provider. SEEK MEDICAL CARE IF:   You think you are having a reaction to medicines taken.  You have recurrent headaches or feel dizzy.  You have swelling in your ankles.  You have trouble with your vision. SEEK IMMEDIATE MEDICAL CARE IF:  You develop a severe headache or confusion.  You have unusual weakness, numbness, or feel faint.  You have severe chest or abdominal pain.  You vomit repeatedly.  You have trouble breathing. MAKE SURE YOU:   Understand these instructions.  Will watch your condition.  Will get help right away if you are not doing well or get worse.   This information is not intended to replace advice given to you by your health care provider. Make sure you discuss any questions you have with your health care provider.   Document Released: 12/02/2005 Document Revised: 04/18/2015 Document Reviewed: 09/24/2013 Elsevier Interactive Patient Education 2016 [employer]  

## 2016-01-26 NOTE — ED Notes (Signed)
At bedside to give meds and pt begins to dry heave. MD aware.

## 2016-01-26 NOTE — ED Notes (Signed)
Pt taking po fluids and tolerating well. 

## 2016-01-26 NOTE — ED Notes (Signed)
Pt oob to br again. UA sent.

## 2016-01-26 NOTE — ED Notes (Signed)
Pt oob to br with steady gait 

## 2016-01-26 NOTE — ED Notes (Signed)
Pt oob to br.

## 2016-01-26 NOTE — ED Notes (Signed)
Pt arrives with c/o HTN and states headache with nausea and nose bleed intermittent since yesterday. States out of BP meds x 1 month.

## 2016-01-31 ENCOUNTER — Observation Stay (HOSPITAL_COMMUNITY)
Admission: EM | Admit: 2016-01-31 | Discharge: 2016-02-02 | Disposition: A | Discharge status: Home / Self Care | Payer: 59 | Attending: Internal Medicine | Admitting: Internal Medicine

## 2016-01-31 ENCOUNTER — Encounter (HOSPITAL_COMMUNITY): Payer: Self-pay | Admitting: Emergency Medicine

## 2016-01-31 DIAGNOSIS — N179 Acute kidney failure, unspecified: Secondary | ICD-10-CM | POA: Insufficient documentation

## 2016-01-31 DIAGNOSIS — I10 Essential (primary) hypertension: Secondary | ICD-10-CM | POA: Diagnosis not present

## 2016-01-31 DIAGNOSIS — E875 Hyperkalemia: Secondary | ICD-10-CM | POA: Insufficient documentation

## 2016-01-31 DIAGNOSIS — Z7982 Long term (current) use of aspirin: Secondary | ICD-10-CM | POA: Diagnosis not present

## 2016-01-31 DIAGNOSIS — I951 Orthostatic hypotension: Secondary | ICD-10-CM

## 2016-01-31 DIAGNOSIS — E785 Hyperlipidemia, unspecified: Secondary | ICD-10-CM | POA: Insufficient documentation

## 2016-01-31 DIAGNOSIS — E876 Hypokalemia: Secondary | ICD-10-CM | POA: Insufficient documentation

## 2016-01-31 DIAGNOSIS — R109 Unspecified abdominal pain: Secondary | ICD-10-CM | POA: Diagnosis present

## 2016-01-31 DIAGNOSIS — R197 Diarrhea, unspecified: Secondary | ICD-10-CM | POA: Insufficient documentation

## 2016-01-31 DIAGNOSIS — E860 Dehydration: Principal | ICD-10-CM | POA: Insufficient documentation

## 2016-01-31 DIAGNOSIS — E871 Hypo-osmolality and hyponatremia: Secondary | ICD-10-CM | POA: Diagnosis not present

## 2016-01-31 DIAGNOSIS — R112 Nausea with vomiting, unspecified: Secondary | ICD-10-CM | POA: Insufficient documentation

## 2016-01-31 DIAGNOSIS — D509 Iron deficiency anemia, unspecified: Secondary | ICD-10-CM | POA: Insufficient documentation

## 2016-01-31 LAB — COMPREHENSIVE METABOLIC PANEL
ALK PHOS: 82 U/L (ref 38–126)
ALT: 27 U/L (ref 14–54)
ANION GAP: 15 (ref 5–15)
AST: 36 U/L (ref 15–41)
Albumin: 3.1 g/dL — ABNORMAL LOW (ref 3.5–5.0)
BILIRUBIN TOTAL: 1 mg/dL (ref 0.3–1.2)
BUN: 24 mg/dL — ABNORMAL HIGH (ref 6–20)
CALCIUM: 9.1 mg/dL (ref 8.9–10.3)
CO2: 25 mmol/L (ref 22–32)
Chloride: 90 mmol/L — ABNORMAL LOW (ref 101–111)
Creatinine, Ser: 1.66 mg/dL — ABNORMAL HIGH (ref 0.44–1.00)
GFR, EST AFRICAN AMERICAN: 37 mL/min — AB (ref >60)
GFR, EST NON AFRICAN AMERICAN: 32 mL/min — AB (ref >60)
Glucose, Bld: 103 mg/dL — ABNORMAL HIGH (ref 65–99)
Potassium: 2.8 mmol/L — ABNORMAL LOW (ref 3.5–5.1)
SODIUM: 130 mmol/L — AB (ref 135–145)
TOTAL PROTEIN: 7.4 g/dL (ref 6.5–8.1)

## 2016-01-31 LAB — CBC
HCT: 35.3 % — ABNORMAL LOW (ref 36.0–46.0)
HEMOGLOBIN: 11.1 g/dL — AB (ref 12.0–15.0)
MCH: 22.2 pg — ABNORMAL LOW (ref 26.0–34.0)
MCHC: 31.4 g/dL (ref 30.0–36.0)
MCV: 70.6 fL — ABNORMAL LOW (ref 78.0–100.0)
Platelets: 295 10*3/uL (ref 150–400)
RBC: 5 MIL/uL (ref 3.87–5.11)
RDW: 13.8 % (ref 11.5–15.5)
WBC: 8.9 10*3/uL (ref 4.0–10.5)

## 2016-01-31 LAB — URINALYSIS, ROUTINE W REFLEX MICROSCOPIC
Glucose, UA: NEGATIVE mg/dL
Ketones, ur: 15 mg/dL — AB
NITRITE: NEGATIVE
PROTEIN: 30 mg/dL — AB
SPECIFIC GRAVITY, URINE: 1.016 (ref 1.005–1.030)
pH: 5.5 (ref 5.0–8.0)

## 2016-01-31 LAB — LIPASE, BLOOD: Lipase: 30 U/L (ref 11–51)

## 2016-01-31 LAB — URINE MICROSCOPIC-ADD ON

## 2016-01-31 LAB — MAGNESIUM: Magnesium: 2.1 mg/dL (ref 1.7–2.4)

## 2016-01-31 MED ORDER — SODIUM CHLORIDE 0.9 % IV SOLN
INTRAVENOUS | Status: DC
  Administered 2016-01-31: 09:00:00 via INTRAVENOUS
  Administered 2016-02-01: 100 mL/h via INTRAVENOUS

## 2016-01-31 MED ORDER — ONDANSETRON HCL 4 MG PO TABS: 4.0000 mg | ORAL_TABLET | Freq: Four times a day (QID) | ORAL | Status: DC | PRN

## 2016-01-31 MED ORDER — ONDANSETRON HCL 4 MG/2ML IJ SOLN
4.0000 mg | Freq: Once | INTRAMUSCULAR | Status: AC
  Administered 2016-01-31: 4 mg via INTRAVENOUS
  Filled 2016-01-31: qty 2

## 2016-01-31 MED ORDER — POTASSIUM CHLORIDE 10 MEQ/100ML IV SOLN
10.0000 meq | INTRAVENOUS | Status: AC
  Administered 2016-01-31 (×6): 10 meq via INTRAVENOUS
  Filled 2016-01-31 (×5): qty 100

## 2016-01-31 MED ORDER — POTASSIUM CHLORIDE CRYS ER 20 MEQ PO TBCR
40.0000 meq | EXTENDED_RELEASE_TABLET | Freq: Once | ORAL | Status: AC
  Administered 2016-01-31: 40 meq via ORAL
  Filled 2016-01-31: qty 2

## 2016-01-31 MED ORDER — AMLODIPINE BESYLATE 10 MG PO TABS
10.0000 mg | ORAL_TABLET | Freq: Every day | ORAL | Status: DC
  Administered 2016-01-31 – 2016-02-02 (×3): 10 mg via ORAL
  Filled 2016-01-31 (×3): qty 1

## 2016-01-31 MED ORDER — LISINOPRIL 20 MG PO TABS
20.0000 mg | ORAL_TABLET | Freq: Every day | ORAL | Status: DC
  Administered 2016-01-31 – 2016-02-02 (×3): 20 mg via ORAL
  Filled 2016-01-31 (×3): qty 1

## 2016-01-31 MED ORDER — ASPIRIN EC 81 MG PO TBEC
81.0000 mg | DELAYED_RELEASE_TABLET | Freq: Every day | ORAL | Status: DC
  Administered 2016-01-31 – 2016-02-02 (×3): 81 mg via ORAL
  Filled 2016-01-31 (×3): qty 1

## 2016-01-31 MED ORDER — ACETAMINOPHEN 650 MG RE SUPP: 650.0000 mg | Freq: Four times a day (QID) | RECTAL | Status: DC | PRN

## 2016-01-31 MED ORDER — SODIUM CHLORIDE 0.9 % IV BOLUS (SEPSIS)
500.0000 mL | Freq: Once | INTRAVENOUS | Status: AC
  Administered 2016-01-31: 500 mL via INTRAVENOUS

## 2016-01-31 MED ORDER — ATORVASTATIN CALCIUM 40 MG PO TABS
40.0000 mg | ORAL_TABLET | Freq: Every day | ORAL | Status: DC
  Administered 2016-01-31 – 2016-02-02 (×3): 40 mg via ORAL
  Filled 2016-01-31 (×3): qty 1

## 2016-01-31 MED ORDER — RISAQUAD PO CAPS
2.0000 | ORAL_CAPSULE | Freq: Every day | ORAL | Status: DC
  Administered 2016-01-31 – 2016-02-02 (×3): 2 via ORAL
  Filled 2016-01-31 (×3): qty 2

## 2016-01-31 MED ORDER — ONDANSETRON HCL 4 MG/2ML IJ SOLN: 4.0000 mg | Freq: Four times a day (QID) | INTRAMUSCULAR | Status: DC | PRN

## 2016-01-31 MED ORDER — ACETAMINOPHEN 325 MG PO TABS: 650.0000 mg | ORAL_TABLET | Freq: Four times a day (QID) | ORAL | Status: DC | PRN

## 2016-01-31 MED ORDER — POTASSIUM CHLORIDE 10 MEQ/100ML IV SOLN
INTRAVENOUS | Status: AC
  Administered 2016-01-31: 17:00:00
  Filled 2016-01-31: qty 100

## 2016-01-31 MED ORDER — ENOXAPARIN SODIUM 40 MG/0.4ML ~~LOC~~ SOLN
40.0000 mg | SUBCUTANEOUS | Status: DC
  Administered 2016-01-31 – 2016-02-02 (×3): 40 mg via SUBCUTANEOUS
  Filled 2016-01-31 (×3): qty 0.4

## 2016-01-31 MED ORDER — HYDROCHLOROTHIAZIDE 25 MG PO TABS
25.0000 mg | ORAL_TABLET | Freq: Every day | ORAL | Status: DC
  Administered 2016-01-31 – 2016-02-02 (×3): 25 mg via ORAL
  Filled 2016-01-31 (×3): qty 1

## 2016-01-31 MED ORDER — FERROUS SULFATE 325 (65 FE) MG PO TABS
325.0000 mg | ORAL_TABLET | Freq: Three times a day (TID) | ORAL | Status: DC
  Administered 2016-01-31 – 2016-02-02 (×7): 325 mg via ORAL
  Filled 2016-01-31 (×7): qty 1

## 2016-01-31 MED ORDER — LISINOPRIL-HYDROCHLOROTHIAZIDE 20-25 MG PO TABS: 1.0000 | ORAL_TABLET | Freq: Every day | ORAL | Status: DC

## 2016-01-31 NOTE — Progress Notes (Signed)
[name] [id number] Admission Data: 01/31/2016 9:27 AM Attending Provider: No att. providers found  [id number], Marcello Moores, MD Consults/ Treatment Team:    [name] is a 60 y.o. female patient admitted from ED awake, alert  & orientated  X 3,  Full Code, VSS - Blood pressure 144/76, pulse 93, temperature 98.7 F (37.1 C), temperature source Oral, resp. rate 18, height 5\' 1"  (1.549 m), weight 74.753 kg (164 lb 12.8 oz), SpO2 97 %., no c/o shortness of breath, no c/o chest pain, no distress noted.    IV site WDL: Intact, running NS.  Allergies:  No Known Allergies   Past Medical History  Diagnosis Date  . Hypertension   . Hyperlipidemia      Pt orientation to unit, room and routine. Information packet given to patient/family and safety video watched.  Admission INP armband ID verified with patient/family, and in place. SR up x 2, fall risk assessment complete with Patient and family verbalizing understanding of risks associated with falls. Pt verbalizes an understanding of how to use the call bell and to call for help before getting out of bed.  Skin, clean-dry- intact without evidence of bruising, or skin tears.   No evidence of skin break down noted on exam.     Will cont to monitor and assist as needed.  Dayle Points, RN 01/31/2016 9:27 AM

## 2016-01-31 NOTE — Progress Notes (Signed)
Attempt to call for report

## 2016-01-31 NOTE — ED Provider Notes (Addendum)
CSN: [id number]     Arrival date & time 01/31/16  0127 History   First MD Initiated Contact with Patient 01/31/16 0524     Chief Complaint  Patient presents with  . Abdominal Pain  . Emesis  . Diarrhea  . Chills  . Generalized Body Aches     (Consider location/radiation/quality/duration/timing/severity/associated sxs/prior Treatment) HPI Comments: Patient presents to the ER for evaluation of nausea, vomiting, diarrhea, chills with generalized body aches. Symptoms have been ongoing for 5 days. She reports that she has been experiencing cramping when she vomits, but no continuous abdominal pain. Patient has not been able to eat or drink over the last several days.  Patient is a 59 y.o. female presenting with abdominal pain, vomiting, and diarrhea.  Abdominal Pain Associated symptoms: chills, diarrhea and vomiting   Emesis Associated symptoms: abdominal pain, chills and diarrhea   Diarrhea Associated symptoms: abdominal pain, chills and vomiting     Past Medical History  Diagnosis Date  . Hypertension   . Hyperlipidemia    Past Surgical History  Procedure Laterality Date  . Colonoscopy     Family History  Problem Relation Age of Onset  . Stroke Mother   . Hyperlipidemia Mother   . Diabetes Mother    Social History  Substance Use Topics  . Smoking status: Never Smoker   . Smokeless tobacco: Never Used  . Alcohol Use: No   OB History    Gravida Para Term Preterm AB TAB SAB Ectopic Multiple Living   2 1   1           Review of Systems  Constitutional: Positive for chills.  Gastrointestinal: Positive for vomiting, abdominal pain and diarrhea.  All other systems reviewed and are negative.     Allergies  Review of patient's allergies indicates no known allergies.  Home Medications   Prior to Admission medications   Medication Sig Start Date End Date Taking? Authorizing Provider  amLODipine (NORVASC) 10 MG tablet Take 1 tablet (10 mg total) by mouth daily.  01/26/16  Yes Gloriann Loan, PA-C  lisinopril-hydrochlorothiazide (PRINZIDE,ZESTORETIC) 20-25 MG tablet Take 1 tablet by mouth daily. 01/26/16  Yes Gloriann Loan, PA-C  aspirin EC 81 MG tablet Take 1 tablet (81 mg total) by mouth daily. Patient not taking: Reported on 01/26/2016 03/04/13   Silverio Decamp, MD  atorvastatin (LIPITOR) 40 MG tablet TAKE ONE TABLET BY MOUTH ONCE DAILY Patient not taking: Reported on 01/26/2016 09/21/14   Silverio Decamp, MD  ferrous sulfate 325 (65 FE) MG tablet TAKE ONE TABLET BY MOUTH THREE TIMES DAILY WITH MEALS Patient not taking: Reported on 01/26/2016 03/31/15   Silverio Decamp, MD  pantoprazole (PROTONIX) 40 MG tablet Take 1 tablet (40 mg total) by mouth 2 (two) times daily. 02/02/16   Charlynne Cousins, MD   BP 115/73 mmHg  Pulse 97  Temp(Src) 99 F (37.2 C) (Oral)  Resp 20  Ht 5\' 1"  (1.549 m)  Wt 167 lb 14.4 oz (76.159 kg)  BMI 31.74 kg/m2  SpO2 99% Physical Exam  Constitutional: She is oriented to person, place, and time. She appears well-developed and well-nourished. No distress.  HENT:  Head: Normocephalic and atraumatic.  Right Ear: Hearing normal.  Left Ear: Hearing normal.  Nose: Nose normal.  Mouth/Throat: Oropharynx is clear and moist and mucous membranes are normal.  Eyes: Conjunctivae and EOM are normal. Pupils are equal, round, and reactive to light.  Neck: Normal range of motion. Neck supple.  Cardiovascular: Regular  rhythm, S1 normal and S2 normal.  Exam reveals no gallop and no friction rub.   No murmur heard. Pulmonary/Chest: Effort normal and breath sounds normal. No respiratory distress. She exhibits no tenderness.  Abdominal: Soft. Normal appearance and bowel sounds are normal. There is no hepatosplenomegaly. There is no tenderness. There is no rebound, no guarding, no tenderness at McBurney's point and negative Murphy's sign. No hernia.  Musculoskeletal: Normal range of motion.  Neurological: She is alert and oriented to  person, place, and time. She has normal strength. No cranial nerve deficit or sensory deficit. Coordination normal. GCS eye subscore is 4. GCS verbal subscore is 5. GCS motor subscore is 6.  Skin: Skin is warm, dry and intact. No rash noted. No cyanosis.  Psychiatric: She has a normal mood and affect. Her speech is normal and behavior is normal. Thought content normal.  Nursing note and vitals reviewed.   ED Course  Procedures (including critical care time) Labs Review Labs Reviewed  COMPREHENSIVE METABOLIC PANEL - Abnormal; Notable for the following:    Sodium 130 (*)    Potassium 2.8 (*)    Chloride 90 (*)    Glucose, Bld 103 (*)    BUN 24 (*)    Creatinine, Ser 1.66 (*)    Albumin 3.1 (*)    GFR calc non Af Amer 32 (*)    GFR calc Af Amer 37 (*)    All other components within normal limits  CBC - Abnormal; Notable for the following:    Hemoglobin 11.1 (*)    HCT 35.3 (*)    MCV 70.6 (*)    MCH 22.2 (*)    All other components within normal limits  URINALYSIS, ROUTINE W REFLEX MICROSCOPIC (NOT AT [facility]) - Abnormal; Notable for the following:    APPearance CLOUDY (*)    Hgb urine dipstick MODERATE (*)    Bilirubin Urine MODERATE (*)    Ketones, ur 15 (*)    Protein, ur 30 (*)    Leukocytes, UA MODERATE (*)    All other components within normal limits  URINE MICROSCOPIC-ADD ON - Abnormal; Notable for the following:    Squamous Epithelial / LPF 0-5 (*)    Bacteria, UA RARE (*)    Casts HYALINE CASTS (*)    All other components within normal limits  BASIC METABOLIC PANEL - Abnormal; Notable for the following:    Glucose, Bld 109 (*)    Calcium 8.7 (*)    All other components within normal limits  CBC - Abnormal; Notable for the following:    Hemoglobin 10.1 (*)    HCT 32.3 (*)    MCV 71.8 (*)    MCH 22.4 (*)    All other components within normal limits  GLUCOSE, CAPILLARY - Abnormal; Notable for the following:    Glucose-Capillary 101 (*)    All other components  within normal limits  URINE CULTURE  LIPASE, BLOOD  MAGNESIUM  GLUCOSE, CAPILLARY    Imaging Review No results found. I have personally reviewed and evaluated these images and lab results as part of my medical decision-making.   EKG Interpretation None      MDM   Final diagnoses:  Dehydration  Nausea and vomiting, vomiting of unspecified type  Diarrhea, unspecified type  Hypokalemia  Orthostasis    Patient presents to the ER with a five-day history of nausea, vomiting, diarrhea. She has had associated chills and generalized weakness. Patient has become progressively more weak and as she  has not been able to eat or drink for several days. She appears clinically dehydrated. Lab work supports this. She has mild acute kidney injury and concentrated urine. Patient experiencing hypokalemia secondary to GI loss. She was initiated on IV fluid bolus and potassium replacement. Patient is, however, profoundly orthostatic to go with her acute kidney injury, indicating moderate to severe dehydration and will require hospitalization for further management.    Orpah Greek, MD 01/31/16 [id number]  Orpah Greek, MD 02/11/16 [id number]

## 2016-01-31 NOTE — H&P (Signed)
[facility] History and Physical  Kari Aquila [id number] DOB: 09/26/1956 DOA: 01/31/2016  Referring physician:  PCP: Aundria Mems, MD   Chief Complaint: Nausea vomiting and diarrhea  HPI: [name] is a 60 y.o. female Presenting to the emergency department with 5-6 day history of progressive nausea, vomiting and diarrhea. The patient has not been able to eat or drink over the last several days. She reports about 2-3 times a day frequency of stools, and to 3 time a day history of emeses. She denies any blood in the stools or in the vomit. She denies any sick contacts but she did visit a family member in hospital and had a recent evaluation at the ED on 2/10 for headaches related to HTN. She denies any abdominal pain. She denies any chest pain, or shortness of breath. She has intermittent myalgias, and chills. She denies any fever. She is very weak due to the symptoms. No confusion is reported.She had a recent history of intermittent headaches, which GERD to the ED on 01/26/2016, with negative workup. This was felt to be secondary to hypertension.She denies any new medications, recent infections requiring antibiotics. At the emergency department, She was afebrile, saturations of oxygen were normal, her blood pressure was 148/83 .No stools or emesis since admission. The patient was initiated on IV fluid bolus, and potassium  Her CMET was remarkable for low sodium of 130, potassium 2.8, creatinine 1.66, and her urine with moderate leukocytes and moderate Hb.   Review of Systems  Constitutional: Positive for chills, malaise/fatigue and diaphoresis. Negative for fever and weight loss.  HENT: Negative for sore throat.   Eyes: Negative for blurred vision, double vision, photophobia, pain and redness.  Respiratory: Negative for hemoptysis, sputum production, shortness of breath and wheezing.   Cardiovascular: Negative for chest pain, palpitations and leg swelling.    Gastrointestinal: Positive for nausea, vomiting and diarrhea. Negative for abdominal pain, constipation, blood in stool and melena.  Genitourinary: Negative.   Musculoskeletal: Negative.   Skin: Negative for itching and rash.  Neurological: Positive for weakness. Negative for dizziness, tremors, speech change, seizures, loss of consciousness and headaches.  Endo/Heme/Allergies: Negative.   Psychiatric/Behavioral: Negative.     Past Medical History  Diagnosis Date  . Hypertension   . Hyperlipidemia    History reviewed. No pertinent past surgical history. Social History:  reports that she has never smoked. She does not have any smokeless tobacco history on file. She reports that she does not drink alcohol or use illicit drugs. Works at the [employer]. One son in good health  No Known Allergies  Family History  Problem Relation Age of Onset  . Stroke Mother   . Hyperlipidemia Mother   . Diabetes Mother      Prior to Admission medications   Medication Sig Start Date End Date Taking? Authorizing Provider  amLODipine (NORVASC) 10 MG tablet Take 1 tablet (10 mg total) by mouth daily. 01/26/16  Yes Gloriann Loan, PA-C  lisinopril-hydrochlorothiazide (PRINZIDE,ZESTORETIC) 20-25 MG tablet Take 1 tablet by mouth daily. 01/26/16  Yes Gloriann Loan, PA-C  aspirin EC 81 MG tablet Take 1 tablet (81 mg total) by mouth daily. Patient not taking: Reported on 01/26/2016 03/04/13   Silverio Decamp, MD  atorvastatin (LIPITOR) 40 MG tablet TAKE ONE TABLET BY MOUTH ONCE DAILY Patient not taking: Reported on 01/26/2016 09/21/14   Silverio Decamp, MD  ferrous sulfate 325 (65 FE) MG tablet TAKE ONE TABLET BY MOUTH THREE TIMES DAILY WITH MEALS Patient not  taking: Reported on 01/26/2016 03/31/15   Silverio Decamp, MD  meloxicam (MOBIC) 15 MG tablet One tab PO qAM with breakfast for 2 weeks, then daily prn pain. Patient not taking: Reported on 01/26/2016 02/18/13   Silverio Decamp, MD   Physical  Exam: Filed Vitals:   01/31/16 0630 01/31/16 0700 01/31/16 0715 01/31/16 0730  BP: 128/73 115/67 148/83 121/68  Pulse: 95 99 98 95  Temp:      TempSrc:      Resp:  23 26 13   SpO2: 97% 100% 100% 94%    Wt Readings from Last 3 Encounters:  01/26/16 77.111 kg (170 lb)  03/04/13 72.576 kg (160 lb)  02/18/13 73.936 kg (163 lb)    Physical Exam  Constitutional: She is oriented to person, place, and time.  Ill appearing  HENT:  Head: Normocephalic and atraumatic.  Mouth/Throat: No oropharyngeal exudate.  Eyes: Pupils are equal, round, and reactive to light. No scleral icterus.  Neck: Normal range of motion. Neck supple. No JVD present. No tracheal deviation present. No thyromegaly present.  Cardiovascular: Normal rate and regular rhythm.  Exam reveals no gallop and no friction rub.   No murmur heard. Pulmonary/Chest: Effort normal and breath sounds normal.  Abdominal: Soft. She exhibits no distension and no mass. There is tenderness. There is no rebound and no guarding.  Active bowel sounds Diffuse tenderness on deep palpation  Musculoskeletal: She exhibits no edema or tenderness.  Lymphadenopathy:    She has no cervical adenopathy.  Neurological: She is alert and oriented to person, place, and time. She displays normal reflexes. No cranial nerve deficit. She exhibits normal muscle tone.  Skin: Skin is warm and dry. No rash noted. No erythema. No pallor.  Psychiatric: Memory and judgment normal.            Labs on Admission:  Basic Metabolic Panel:  Recent Labs Lab 01/31/16 0135  NA 130*  K 2.8*  CL 90*  CO2 25  GLUCOSE 103*  BUN 24*  CREATININE 1.66*  CALCIUM 9.1    Liver Function Tests:  Recent Labs Lab 01/31/16 0135  AST 36  ALT 27  ALKPHOS 82  BILITOT 1.0  PROT 7.4  ALBUMIN 3.1*    Recent Labs Lab 01/31/16 0135  LIPASE 30   No results for input(s): AMMONIA in the last 168 hours.  CBC:  Recent Labs Lab 01/31/16 0135  WBC 8.9  HGB 11.1*    HCT 35.3*  MCV 70.6*  PLT 295     Radiological Exams on Admission: No results found.   Assessment/Plan Active Problems:   Hypertension   Microcytic anemia   Hyperlipidemia with target LDL less than 100   Nausea vomiting and diarrhea   Hypokalemia due to loss of potassium   Acute hyponatremia   Acute renal failure (ARF) (HCC)  Diarrhea, likely due to Viral Gastroenteritis. No stools or emesis since admission. Continues to be symtomatic.    Will admit for observation at Med Surg.      IVF with K replenishment     Enteric precautions -  C. Diff -  Stool culture -  Cipro/Flagyl -  Clear liquid diet and advance as tolerated -  Start imodium if C. Diff is negative _  Add Probiotics  Nausea and Vomiting, likely de to viral etiology IV Zofran as needed IVF  Acute kidney injury, due to dehydration. Current creatinine is 1.66. BL 0.8 She received 1 l bolus NS at the ED IV fluids  100 cc/hr   Hypertension. BP 120/69 mmHg  Pulse 101 Continue home anti-hypertensive medications including HCTZ  Will hold Prizide until 2/16 due to hyponatremia Will consider adding Hydralazine Q6 hours as needed for SBP >160 and /or DBP >110.    Hyponatremia, acute likely due to dehydration, possible HCTZ. Current Na is 130 Hold Prinzide (Lisinopril/HCTZ) till tomorrow. Continue to monitor  Hypokalemia acute, due to GI losses. Current K is 2.8 Will replenish with KCL 6 runs  Check Magnesium   Hyperlipidemia Continue Lipitor  Iron deficiency anemia Hb 11, MCV at 70. This is followed by her PCP Continue Ferrous Sulfate  Abnormal Urine UA with moderate leukocytes and moderate Hb.  Urine Cultures pending Will hold antibiotics for now       DVT Prophylaxis: Lovenox Family Communication: Son at bedside Disposition Plan: Pending Improvement. Admitted for observation in tele bed. Expected LOS 24-48 hrs    [facility] E,PA-C [facility] [email or web address] Password [email or web address]

## 2016-01-31 NOTE — ED Notes (Signed)
Pt. reports generalized abdominal pain with nausea , emesis , diarrhea , chills , body aches and fatigue onset last Wednesday .

## 2016-02-01 DIAGNOSIS — E876 Hypokalemia: Secondary | ICD-10-CM

## 2016-02-01 DIAGNOSIS — R197 Diarrhea, unspecified: Secondary | ICD-10-CM | POA: Diagnosis not present

## 2016-02-01 DIAGNOSIS — E871 Hypo-osmolality and hyponatremia: Secondary | ICD-10-CM | POA: Diagnosis not present

## 2016-02-01 DIAGNOSIS — E785 Hyperlipidemia, unspecified: Secondary | ICD-10-CM | POA: Diagnosis not present

## 2016-02-01 DIAGNOSIS — D509 Iron deficiency anemia, unspecified: Secondary | ICD-10-CM

## 2016-02-01 DIAGNOSIS — I10 Essential (primary) hypertension: Secondary | ICD-10-CM | POA: Diagnosis not present

## 2016-02-01 DIAGNOSIS — R112 Nausea with vomiting, unspecified: Secondary | ICD-10-CM

## 2016-02-01 DIAGNOSIS — N179 Acute kidney failure, unspecified: Secondary | ICD-10-CM

## 2016-02-01 LAB — BASIC METABOLIC PANEL
ANION GAP: 12 (ref 5–15)
BUN: 10 mg/dL (ref 6–20)
CO2: 25 mmol/L (ref 22–32)
Calcium: 8.7 mg/dL — ABNORMAL LOW (ref 8.9–10.3)
Chloride: 101 mmol/L (ref 101–111)
Creatinine, Ser: 0.79 mg/dL (ref 0.44–1.00)
GLUCOSE: 109 mg/dL — AB (ref 65–99)
POTASSIUM: 3.8 mmol/L (ref 3.5–5.1)
Sodium: 138 mmol/L (ref 135–145)

## 2016-02-01 LAB — URINE CULTURE: Culture: 7000

## 2016-02-01 LAB — CBC
HEMATOCRIT: 32.3 % — AB (ref 36.0–46.0)
HEMOGLOBIN: 10.1 g/dL — AB (ref 12.0–15.0)
MCH: 22.4 pg — AB (ref 26.0–34.0)
MCHC: 31.3 g/dL (ref 30.0–36.0)
MCV: 71.8 fL — ABNORMAL LOW (ref 78.0–100.0)
Platelets: 274 10*3/uL (ref 150–400)
RBC: 4.5 MIL/uL (ref 3.87–5.11)
RDW: 14.3 % (ref 11.5–15.5)
WBC: 7.5 10*3/uL (ref 4.0–10.5)

## 2016-02-01 LAB — GLUCOSE, CAPILLARY: Glucose-Capillary: 101 mg/dL — ABNORMAL HIGH (ref 65–99)

## 2016-02-01 NOTE — Progress Notes (Signed)
[facility] PROGRESS NOTE    Progress Note   Josee Stigen [id number] DOB: 1956/03/04 DOA: 01/31/2016 PCP: Aundria Mems, MD   Brief Narrative:   [name] is an 60 y.o. female past history of essential hypertension who presents with acute renal failure, hyponatremia and ongoing diarrhea for several days.  Assessment/Plan:   Diarrhea likely due to viral illness or enteritis: No stools or emesis since admission. Discontinue C. difficile as patient has not had any further bowel movements. He has no leukocytosis no fever no bloody stools to discontinue Cipro Flagyl. Advanced to liquid diet and then advance as tolerated.  Nausea and vomiting likely due to viral etiology: No resolved. KVO IV fluids.  Acute kidney injury: Baseline creatinine is less than 1.0 admission was 1.6. I will has returned to baseline. Likely prerenal in etiology resolved with IV fluid hydration.  Essential hypertension: Blood pressure at goal Resume antihypertensive medications as an outpatient.  Acute hyponatremia/hypokalemia: Likely due to hypovolemia and antihypertensive medication now resolved with IV fluid hydration. Hyperkalemia is likely due to GI losses, now resolved.   Microcytic anemia: Continue for Ferrous sulfate follow-up with PCP as an outpatient  Hyperlipidemia with target LDL less than 100     DVT Prophylaxis - Lovenox ordered.  Family Communication: daughters Disposition Plan: Home in am Code Status:     Code Status Orders        Start     Ordered   01/31/16 [phone]  Full code   Continuous     01/31/16 0839    Code Status History    Date Active Date Inactive Code Status Order ID Comments User Context   This patient has a current code status but no historical code status.        IV Access:    Peripheral IV   Procedures and diagnostic studies:   No results found.   Medical Consultants:    None.  Anti-Infectives:   Anti-infectives     None      Subjective:    Pearletha Furl no further diarrhea, she is hungry.  Objective:    Filed Vitals:   01/31/16 1513 01/31/16 2122 02/01/16 0608 02/01/16 0754  BP: 115/62 118/54 127/67 129/83  Pulse: 95 90 91   Temp: 98.6 F (37 C) 99.5 F (37.5 C) 99 F (37.2 C)   TempSrc: Oral Oral Oral   Resp: 16 18 18    Height:      Weight:   77.1 kg (169 lb 15.6 oz)   SpO2: 99% 98% 99%     Intake/Output Summary (Last 24 hours) at 02/01/16 1043 Last data filed at 02/01/16 0700  Gross per 24 hour  Intake   2825 ml  Output   1000 ml  Net   1825 ml   Filed Weights   01/31/16 0903 02/01/16 0608  Weight: 74.753 kg (164 lb 12.8 oz) 77.1 kg (169 lb 15.6 oz)    Exam: Gen:  NAD Cardiovascular:  RRR. Chest and lungs:   CTAB Abdomen:  Abdomen soft, NT/ND, + BS Extremities:  No edema   Data Reviewed:    Labs: Basic Metabolic Panel:  Recent Labs Lab 01/31/16 0135 01/31/16 1610 02/01/16 0816  NA 130*  --  138  K 2.8*  --  3.8  CL 90*  --  101  CO2 25  --  25  GLUCOSE 103*  --  109*  BUN 24*  --  10  CREATININE 1.66*  --  0.79  CALCIUM 9.1  --  8.7*  MG  --  2.1  --    GFR Estimated Creatinine Clearance: 69.4 mL/min (by C-G formula based on Cr of 0.79). Liver Function Tests:  Recent Labs Lab 01/31/16 0135  AST 36  ALT 27  ALKPHOS 82  BILITOT 1.0  PROT 7.4  ALBUMIN 3.1*    Recent Labs Lab 01/31/16 0135  LIPASE 30   No results for input(s): AMMONIA in the last 168 hours. Coagulation profile No results for input(s): INR, PROTIME in the last 168 hours.  CBC:  Recent Labs Lab 01/31/16 0135 02/01/16 0816  WBC 8.9 7.5  HGB 11.1* 10.1*  HCT 35.3* 32.3*  MCV 70.6* 71.8*  PLT 295 274   Cardiac Enzymes: No results for input(s): CKTOTAL, CKMB, CKMBINDEX, TROPONINI in the last 168 hours. BNP (last 3 results) No results for input(s): PROBNP in the last 8760 hours. CBG:  Recent Labs Lab 02/01/16 0753  GLUCAP 101*   D-Dimer: No results  for input(s): DDIMER in the last 72 hours. Hgb A1c: No results for input(s): HGBA1C in the last 72 hours. Lipid Profile: No results for input(s): CHOL, HDL, LDLCALC, TRIG, CHOLHDL, LDLDIRECT in the last 72 hours. Thyroid function studies: No results for input(s): TSH, T4TOTAL, T3FREE, THYROIDAB in the last 72 hours.  Invalid input(s): FREET3 Anemia work up: No results for input(s): VITAMINB12, FOLATE, FERRITIN, TIBC, IRON, RETICCTPCT in the last 72 hours. Sepsis Labs:  Recent Labs Lab 01/31/16 0135 02/01/16 0816  WBC 8.9 7.5   Microbiology No results found for this or any previous visit (from the past 240 hour(s)).   Medications:   . acidophilus  2 capsule Oral Daily  . amLODipine  10 mg Oral Daily  . aspirin EC  81 mg Oral Daily  . atorvastatin  40 mg Oral Daily  . enoxaparin (LOVENOX) injection  40 mg Subcutaneous Q24H  . ferrous sulfate  325 mg Oral TID WC  . lisinopril  20 mg Oral Daily   And  . hydrochlorothiazide  25 mg Oral Daily   Continuous Infusions: . sodium chloride 100 mL/hr (02/01/16 0344)    Time spent: 15 min     FELIZ Marguarite Arbour  [facility] Pager [phone]  *Please refer to [email or web address], password [email or web address] to get updated schedule on who will round on this patient, as hospitalists switch teams weekly. If 7PM-7AM, please contact night-coverage at [email or web address], password [email or web address] for any overnight needs.  02/01/2016, 10:43 AM

## 2016-02-02 DIAGNOSIS — I10 Essential (primary) hypertension: Secondary | ICD-10-CM | POA: Diagnosis not present

## 2016-02-02 DIAGNOSIS — E871 Hypo-osmolality and hyponatremia: Secondary | ICD-10-CM | POA: Diagnosis not present

## 2016-02-02 DIAGNOSIS — E785 Hyperlipidemia, unspecified: Secondary | ICD-10-CM

## 2016-02-02 DIAGNOSIS — N179 Acute kidney failure, unspecified: Secondary | ICD-10-CM | POA: Diagnosis not present

## 2016-02-02 LAB — GLUCOSE, CAPILLARY: Glucose-Capillary: 97 mg/dL (ref 65–99)

## 2016-02-02 MED ORDER — PANTOPRAZOLE SODIUM 40 MG PO TBEC: 40.0000 mg | DELAYED_RELEASE_TABLET | Freq: Two times a day (BID) | ORAL | Status: DC

## 2016-02-02 MED ORDER — PANTOPRAZOLE SODIUM 40 MG PO TBEC
40.0000 mg | DELAYED_RELEASE_TABLET | Freq: Two times a day (BID) | ORAL | Status: DC
  Administered 2016-02-02: 40 mg via ORAL
  Filled 2016-02-02: qty 1

## 2016-02-02 NOTE — Progress Notes (Signed)
[name] to be D/C'd to home per MD order.  Discussed with the patient and all questions fully answered.  VSS, Skin clean, dry and intact without evidence of skin break down, no evidence of skin tears noted. IV catheter discontinued intact. Site without signs and symptoms of complications. Dressing and pressure applied.  An After Visit Summary was printed and given to the patient. Patient received prescription.  D/c education completed with patient/family including follow up instructions, medication list, d/c activities limitations if indicated, with other d/c instructions as indicated by MD - patient able to verbalize understanding, all questions fully answered.   Patient instructed to return to ED, call 911, or call MD for any changes in condition.   Patient escorted via [place], and D/C home via private auto.  Morley Kos Price 02/02/2016 1:02 PM

## 2016-02-02 NOTE — Discharge Summary (Signed)
Physician Discharge Summary  [name] [id number] DOB: 1956/02/11 DOA: 01/31/2016  PCP: Aundria Mems, MD  Admit date: 01/31/2016 Discharge date: 02/02/2016  Time spent:35 minutes  Recommendations for Outpatient Follow-up:  1. Follow-up with primary care doctor as needed.   Discharge Diagnoses:  Active Problems:   Hypertension   Microcytic anemia   Hyperlipidemia with target LDL less than 100   Nausea vomiting and diarrhea   Hypokalemia due to loss of potassium   Acute hyponatremia   Acute renal failure (ARF) (HCC)   Diarrhea   AKI (acute kidney injury) [facility])   Discharge Condition: stable  Diet recommendation: regular  Filed Weights   01/31/16 0903 02/01/16 0608 02/02/16 0627  Weight: 74.753 kg (164 lb 12.8 oz) 77.1 kg (169 lb 15.6 oz) 76.159 kg (167 lb 14.4 oz)    History of present illness:  60 year old presents to the emergency department with progressive nausea vomiting and diarrhea. The patient has not been able to eat or drink over the last several days having frequent bowel movements 4-6 and vomiting 2-3 times a day. She denies any blood in stools and hematemesis. She denies any sick contacts.  Hospital Course:  Diarrhea related to viral illness or enteritis: Since admission to the hospital she did not have any further diarrhea or emesis. She's had no fever no leukocytosis no bloody stools. She was started empirically on Cipro Flagyl but this was discontinued as she had no further diarrhea or bloody stools. She was started on Protonix twice a day she was started on a diet which she tolerated.  Acute kidney injury: Her baseline creatinine is less than 1 admission it was 1.6. His return to baseline with IV fluid hydration this likely prerenal in etiology.  Essential hypertension: Antihypertensive medications were held at admission this will be resumed as an outpatient.  Acute hyponatremia hypokalemia: Likely to GI losses, did resolve.  Microcytic  anemia: Continue ferrous sulfate follow-up with PCP as an outpatient. Her hemoglobin remained stable.  Procedures:  None  Consultations:  None  Discharge Exam: Filed Vitals:   02/02/16 0513 02/02/16 0824  BP: 128/71 115/73  Pulse: 97   Temp: 99 F (37.2 C)   Resp: 20     General: A&O x3 Cardiovascular: RRR Respiratory: good air movement CTA B/L  Discharge Instructions   Discharge Instructions    Diet - low sodium heart healthy    Complete by:  As directed      Increase activity slowly    Complete by:  As directed           Current Discharge Medication List    START taking these medications   Details  pantoprazole (PROTONIX) 40 MG tablet Take 1 tablet (40 mg total) by mouth 2 (two) times daily. Qty: 60 tablet, Refills: 3      CONTINUE these medications which have NOT CHANGED   Details  amLODipine (NORVASC) 10 MG tablet Take 1 tablet (10 mg total) by mouth daily. Qty: 30 tablet, Refills: 0    lisinopril-hydrochlorothiazide (PRINZIDE,ZESTORETIC) 20-25 MG tablet Take 1 tablet by mouth daily. Qty: 30 tablet, Refills: 0    aspirin EC 81 MG tablet Take 1 tablet (81 mg total) by mouth daily. Qty: 90 tablet, Refills: 3   Associated Diagnoses: Hypertension    atorvastatin (LIPITOR) 40 MG tablet TAKE ONE TABLET BY MOUTH ONCE DAILY Qty: 90 tablet, Refills: 0    ferrous sulfate 325 (65 FE) MG tablet TAKE ONE TABLET BY MOUTH THREE TIMES DAILY WITH MEALS  Qty: 90 tablet, Refills: 0      STOP taking these medications     meloxicam (MOBIC) 15 MG tablet        No Known Allergies Follow-up Information    Follow up with Aundria Mems, MD In 2 weeks.   Specialties:  Family Medicine, Sports Medicine, Radiology   Contact information:   [phone] [address] [place] [address] [phone]        The results of significant diagnostics from this hospitalization (including imaging, microbiology, ancillary and laboratory) are listed below for  reference.    Significant Diagnostic Studies: No results found.  Microbiology: Recent Results (from the past 240 hour(s))  Urine culture     Status: None   Collection Time: 01/31/16  1:50 AM  Result Value Ref Range Status   Specimen Description URINE, CLEAN CATCH  Final   Special Requests NONE  Final   Culture 7,000 COLONIES/mL INSIGNIFICANT GROWTH  Final   Report Status 02/01/2016 FINAL  Final     Labs: Basic Metabolic Panel:  Recent Labs Lab 01/31/16 0135 01/31/16 1610 02/01/16 0816  NA 130*  --  138  K 2.8*  --  3.8  CL 90*  --  101  CO2 25  --  25  GLUCOSE 103*  --  109*  BUN 24*  --  10  CREATININE 1.66*  --  0.79  CALCIUM 9.1  --  8.7*  MG  --  2.1  --    Liver Function Tests:  Recent Labs Lab 01/31/16 0135  AST 36  ALT 27  ALKPHOS 82  BILITOT 1.0  PROT 7.4  ALBUMIN 3.1*    Recent Labs Lab 01/31/16 0135  LIPASE 30   No results for input(s): AMMONIA in the last 168 hours. CBC:  Recent Labs Lab 01/31/16 0135 02/01/16 0816  WBC 8.9 7.5  HGB 11.1* 10.1*  HCT 35.3* 32.3*  MCV 70.6* 71.8*  PLT 295 274   Cardiac Enzymes: No results for input(s): CKTOTAL, CKMB, CKMBINDEX, TROPONINI in the last 168 hours. BNP: BNP (last 3 results) No results for input(s): BNP in the last 8760 hours.  ProBNP (last 3 results) No results for input(s): PROBNP in the last 8760 hours.  CBG:  Recent Labs Lab 02/01/16 0753 02/02/16 0810  GLUCAP 101* 97     Signed:  Charlynne Cousins MD.  [facility] 02/02/2016, 10:28 AM

## 2016-02-14 ENCOUNTER — Ambulatory Visit (INDEPENDENT_AMBULATORY_CARE_PROVIDER_SITE_OTHER): Payer: 59 | Admitting: Sports Medicine

## 2016-02-14 ENCOUNTER — Ambulatory Visit (INDEPENDENT_AMBULATORY_CARE_PROVIDER_SITE_OTHER): Payer: 59

## 2016-02-14 ENCOUNTER — Encounter: Payer: Self-pay | Admitting: Sports Medicine

## 2016-02-14 VITALS — BP 92/68 | HR 120 | Temp 97.4°F | Resp 22 | Wt 157.4 lb

## 2016-02-14 DIAGNOSIS — R109 Unspecified abdominal pain: Secondary | ICD-10-CM

## 2016-02-14 DIAGNOSIS — R197 Diarrhea, unspecified: Secondary | ICD-10-CM | POA: Diagnosis not present

## 2016-02-14 DIAGNOSIS — R1084 Generalized abdominal pain: Secondary | ICD-10-CM | POA: Diagnosis not present

## 2016-02-14 DIAGNOSIS — R112 Nausea with vomiting, unspecified: Secondary | ICD-10-CM

## 2016-02-14 LAB — URINALYSIS
Bilirubin Urine: NEGATIVE
Glucose, UA: NEGATIVE
Nitrite: NEGATIVE
Protein, ur: NEGATIVE
Specific Gravity, Urine: <1.005 (ref 1.001–1.035)
pH: 6 (ref 5.0–8.0)

## 2016-02-14 LAB — LIPASE: Lipase: 9 U/L (ref 7–60)

## 2016-02-14 LAB — CBC WITH DIFFERENTIAL/PLATELET
Basophils Absolute: 0 K/uL (ref 0.0–0.1)
Basophils Relative: 0 % (ref 0–1)
Eosinophils Absolute: 0.1 K/uL (ref 0.0–0.7)
Eosinophils Relative: 1 % (ref 0–5)
HCT: 35.8 % — ABNORMAL LOW (ref 36.0–46.0)
Hemoglobin: 12.1 g/dL (ref 12.0–15.0)
Lymphocytes Relative: 22 % (ref 12–46)
Lymphs Abs: 2.1 10*3/uL (ref 0.7–4.0)
MCH: 22.2 pg — ABNORMAL LOW (ref 26.0–34.0)
MCHC: 33.8 g/dL (ref 30.0–36.0)
MCV: 65.6 fL — ABNORMAL LOW (ref 78.0–100.0)
MPV: 8.5 fL — ABNORMAL LOW (ref 8.6–12.4)
Monocytes Absolute: 0.8 10*3/uL (ref 0.1–1.0)
Monocytes Relative: 8 % (ref 3–12)
Neutro Abs: 6.7 10*3/uL (ref 1.7–7.7)
Neutrophils Relative %: 69 % (ref 43–77)
Platelets: 473 K/uL — ABNORMAL HIGH (ref 150–400)
RBC: 5.46 MIL/uL — ABNORMAL HIGH (ref 3.87–5.11)
RDW: 14.9 % (ref 11.5–15.5)
WBC: 9.7 10*3/uL (ref 4.0–10.5)

## 2016-02-14 LAB — COMPREHENSIVE METABOLIC PANEL WITH GFR
AST: 20 U/L (ref 10–35)
Albumin: 3.3 g/dL — ABNORMAL LOW (ref 3.6–5.1)
Calcium: 8.3 mg/dL — ABNORMAL LOW (ref 8.6–10.4)
Creat: 2.04 mg/dL — ABNORMAL HIGH (ref 0.50–0.99)
Sodium: 132 mmol/L — ABNORMAL LOW (ref 135–146)
Total Bilirubin: 0.8 mg/dL (ref 0.2–1.2)
Total Protein: 7 g/dL (ref 6.1–8.1)

## 2016-02-14 LAB — AMYLASE: Amylase: 24 U/L (ref 0–105)

## 2016-02-14 LAB — COMPREHENSIVE METABOLIC PANEL
ALT: 11 U/L (ref 6–29)
Alkaline Phosphatase: 86 U/L (ref 33–130)
BUN: 15 mg/dL (ref 7–25)
CO2: 21 mmol/L (ref 20–31)
Chloride: 93 mmol/L — ABNORMAL LOW (ref 98–110)
Glucose, Bld: 68 mg/dL (ref 65–99)
Potassium: 3.5 mmol/L (ref 3.5–5.3)

## 2016-02-14 LAB — TSH: TSH: 0.33 m[IU]/L — ABNORMAL LOW

## 2016-02-14 MED ORDER — CIPROFLOXACIN HCL 750 MG PO TABS: 750.0000 mg | ORAL_TABLET | Freq: Two times a day (BID) | ORAL | Status: DC

## 2016-02-14 MED ORDER — PROMETHAZINE HCL 25 MG PO TABS: 25.0000 mg | ORAL_TABLET | Freq: Four times a day (QID) | ORAL | Status: DC | PRN

## 2016-02-14 MED ORDER — METRONIDAZOLE 500 MG PO TABS: 500.0000 mg | ORAL_TABLET | Freq: Two times a day (BID) | ORAL | Status: DC

## 2016-02-14 MED ORDER — IOHEXOL 300 MG/ML  SOLN: 100.0000 mL | Freq: Once | INTRAMUSCULAR | Status: DC | PRN

## 2016-02-14 MED ORDER — ONDANSETRON 8 MG PO TBDP: 8.0000 mg | ORAL_TABLET | Freq: Three times a day (TID) | ORAL | Status: DC | PRN

## 2016-02-14 NOTE — Assessment & Plan Note (Addendum)
Zofran, Phenergan, 2 L IV fluids.  Discontinue lisinopril/hydrochlorothiazide for now. Fairly significant abdominal pain, CT of the abdomen and pelvis with oral and IV contrast, she will need stat blood work.  CT shows severe enteritis with a bit of free fluid in the pelvis, acute renal failure likely prerenal azotemia, patient will return today for repeat renal function, she does feel a little bit better. Also adding T3 and T4 levels for labs today, TSH was a bit low, this is likely simply euthyroid sick syndrome.

## 2016-02-14 NOTE — Progress Notes (Addendum)
  Subjective:    CC: Follow-up  HPI: Recent discharge from the hospital for acute renal failure, diarrhea, dehydration. Labs had normalized at discharge, unfortunately his continued to have nausea, vomiting and poor oral intake, has been using her lisinopril/hydrochlorothiazide. Severe nausea, weakness. No chest pain, shortness of breath.  Past medical history, Surgical history, Family history not pertinant except as noted below, Social history, Allergies, and medications have been entered into the medical record, reviewed, and no changes needed.   Review of Systems: No fevers, chills, night sweats, weight loss, chest pain, or shortness of breath.   Objective:    General: Well Developed, well nourished, and in no acute distress.  Neuro: Alert and oriented x3, extra-ocular muscles intact, sensation grossly intact.  HEENT: Normocephalic, atraumatic, pupils equal round reactive to light, neck supple, no masses, no lymphadenopathy, thyroid nonpalpable.  Skin: Warm and dry, no rashes. Cardiac: Regular rate and rhythm, no murmurs rubs or gallops, no lower extremity edema.  Respiratory: Clear to auscultation bilaterally. Not using accessory muscles, speaking in full sentences. Abdomen: Soft, tender with guarding diffusely, normal bowel sounds, palpable masses or rebound tenderness.  20-gauge angiocatheter placed in the right cubital vein, 2 L of normal saline infused.  Impression and Recommendations:   I spent 40 minutes with this patient, greater than 50% was face-to-face time counseling regarding the above diagnoses, this was separate from the time spent performing the above procedure

## 2016-02-14 NOTE — Addendum Note (Signed)
Addended by: Silverio Decamp on: 02/14/2016 05:36 PM   Modules accepted: Orders

## 2016-02-15 ENCOUNTER — Telehealth: Payer: Self-pay | Admitting: Family Medicine

## 2016-02-15 LAB — T3, FREE: T3, Free: 1.7 pg/mL — ABNORMAL LOW (ref 2.3–4.2)

## 2016-02-15 LAB — BASIC METABOLIC PANEL WITH GFR
BUN: 11 mg/dL (ref 7–25)
CO2: 24 mmol/L (ref 20–31)
Calcium: 9 mg/dL (ref 8.6–10.4)
Glucose, Bld: 58 mg/dL — ABNORMAL LOW (ref 65–99)
Sodium: 135 mmol/L (ref 135–146)

## 2016-02-15 LAB — BASIC METABOLIC PANEL
Chloride: 94 mmol/L — ABNORMAL LOW (ref 98–110)
Creat: 1.21 mg/dL — ABNORMAL HIGH (ref 0.50–0.99)
Potassium: 3.2 mmol/L — ABNORMAL LOW (ref 3.5–5.3)

## 2016-02-15 LAB — T4, FREE: Free T4: 0.6 ng/dL — ABNORMAL LOW (ref 0.8–1.8)

## 2016-02-15 NOTE — Telephone Encounter (Signed)
Contacted by "Maudie Mercury" regarding our lab relaying a BMP reflecting patient's glucose of 58, potassium of 3.2, and creatinine of 1.2.   Since creatinine has improved without hyperkalemia I will forward this to her PCP, no expected need for emergent evaluation based on this information.

## 2016-02-15 NOTE — Addendum Note (Signed)
Addended by: Silverio Decamp on: 02/15/2016 08:29 AM   Modules accepted: Orders

## 2016-02-16 ENCOUNTER — Inpatient Hospital Stay: Payer: 59 | Admitting: Sports Medicine

## 2016-02-16 MED ORDER — POTASSIUM CHLORIDE CRYS ER 20 MEQ PO TBCR: 40.0000 meq | EXTENDED_RELEASE_TABLET | Freq: Every day | ORAL | Status: DC

## 2016-02-16 NOTE — Addendum Note (Signed)
Addended by: Silverio Decamp on: 02/16/2016 08:23 AM   Modules accepted: Orders

## 2016-02-17 LAB — URINE CULTURE: Colony Count: 30000

## 2016-02-20 ENCOUNTER — Encounter: Payer: Self-pay | Admitting: Sports Medicine

## 2016-02-20 ENCOUNTER — Ambulatory Visit (INDEPENDENT_AMBULATORY_CARE_PROVIDER_SITE_OTHER): Payer: 59 | Admitting: Sports Medicine

## 2016-02-20 VITALS — BP 106/74 | HR 102 | Temp 97.6°F | Resp 18 | Wt 156.0 lb

## 2016-02-20 DIAGNOSIS — R197 Diarrhea, unspecified: Secondary | ICD-10-CM

## 2016-02-20 DIAGNOSIS — K529 Noninfective gastroenteritis and colitis, unspecified: Secondary | ICD-10-CM

## 2016-02-20 DIAGNOSIS — R112 Nausea with vomiting, unspecified: Secondary | ICD-10-CM | POA: Diagnosis not present

## 2016-02-20 MED ORDER — ONDANSETRON 8 MG PO TBDP: 8.0000 mg | ORAL_TABLET | Freq: Three times a day (TID) | ORAL | Status: DC

## 2016-02-20 NOTE — Assessment & Plan Note (Signed)
Moderate to severe enteritis seen on CT scan. Unable to tolerate metronidazole, continue Cipro. Increase Zofran to 3 times per day. She did well with improvement in her renal function after 2 L of IV fluid. Continues to be a bit nauseated, 5 days after initiation of treatment. At this point we are going to stay the course, she will push chicken soup. Return to see me at the end of the week. She did have a bit of hypokalemia, rechecking potassium levels, we have been supplementing with 40 mEq daily.

## 2016-02-20 NOTE — Progress Notes (Signed)
  Subjective:    CC: Follow-up  HPI: This pleasant 60 year old female returns 5 days after CT diagnosed enteritis with nausea, vomiting. She had great difficulty keeping food down so we gave her 2 L IV fluids, her renal function improved, she was a bit hypokalemic so we added potassium supplementation as well. She has improved but is still a bit nauseated and has some difficulty keeping down foods. She continues to hold her blood pressure medications.  Past medical history, Surgical history, Family history not pertinant except as noted below, Social history, Allergies, and medications have been entered into the medical record, reviewed, and no changes needed.   Review of Systems: No fevers, chills, night sweats, weight loss, chest pain, or shortness of breath.   Objective:    General: Well Developed, well nourished, and in no acute distress.  Neuro: Alert and oriented x3, extra-ocular muscles intact, sensation grossly intact.  HEENT: Normocephalic, atraumatic, pupils equal round reactive to light, neck supple, no masses, no lymphadenopathy, thyroid nonpalpable.  Skin: Warm and dry, no rashes. Cardiac: Regular rate and rhythm, no murmurs rubs or gallops, no lower extremity edema.  Respiratory: Clear to auscultation bilaterally. Not using accessory muscles, speaking in full sentences. Abdomen: Soft, nontender, nondistended, normal bowel sounds, no palpable masses, no guarding, rigidity, rebound tenderness.  Impression and Recommendations:    I spent 25 minutes with this patient, greater than 50% was face-to-face time counseling regarding the above diagnoses

## 2016-02-21 LAB — CBC WITH DIFFERENTIAL/PLATELET
Basophils Absolute: 0.1 K/uL (ref 0.0–0.1)
Basophils Relative: 1 % (ref 0–1)
Eosinophils Absolute: 0.2 K/uL (ref 0.0–0.7)
Eosinophils Relative: 4 % (ref 0–5)
HCT: 36.3 % (ref 36.0–46.0)
Hemoglobin: 11 g/dL — ABNORMAL LOW (ref 12.0–15.0)
Lymphocytes Relative: 35 % (ref 12–46)
Lymphs Abs: 2.1 K/uL (ref 0.7–4.0)
MCH: 21.6 pg — ABNORMAL LOW (ref 26.0–34.0)
MCHC: 30.3 g/dL (ref 30.0–36.0)
MCV: 71.3 fL — ABNORMAL LOW (ref 78.0–100.0)
MPV: 9.2 fL (ref 8.6–12.4)
Monocytes Absolute: 0.7 10*3/uL (ref 0.1–1.0)
Monocytes Relative: 11 % (ref 3–12)
Neutro Abs: 2.9 K/uL (ref 1.7–7.7)
Neutrophils Relative %: 49 % (ref 43–77)
Platelets: 477 K/uL — ABNORMAL HIGH (ref 150–400)
RBC: 5.09 MIL/uL (ref 3.87–5.11)
RDW: 15.1 % (ref 11.5–15.5)
WBC: 6 10*3/uL (ref 4.0–10.5)

## 2016-02-21 LAB — COMPREHENSIVE METABOLIC PANEL
ALT: 8 U/L (ref 6–29)
Albumin: 3.6 g/dL (ref 3.6–5.1)
Alkaline Phosphatase: 70 U/L (ref 33–130)
Calcium: 9.9 mg/dL (ref 8.6–10.4)
Potassium: 3.8 mmol/L (ref 3.5–5.3)
Total Protein: 7.5 g/dL (ref 6.1–8.1)

## 2016-02-21 LAB — COMPREHENSIVE METABOLIC PANEL WITH GFR
AST: 24 U/L (ref 10–35)
BUN: 9 mg/dL (ref 7–25)
CO2: 24 mmol/L (ref 20–31)
Chloride: 94 mmol/L — ABNORMAL LOW (ref 98–110)
Creat: 1.4 mg/dL — ABNORMAL HIGH (ref 0.50–0.99)
Glucose, Bld: 104 mg/dL — ABNORMAL HIGH (ref 65–99)
Sodium: 136 mmol/L (ref 135–146)
Total Bilirubin: 0.5 mg/dL (ref 0.2–1.2)

## 2016-02-21 LAB — LIPASE: Lipase: 11 U/L (ref 7–60)

## 2016-02-21 LAB — AMYLASE: Amylase: 27 U/L (ref 0–105)

## 2016-02-22 LAB — LACTIC ACID, PLASMA: LACTIC ACID: 13 mg/dL (ref 4–16)

## 2016-02-26 ENCOUNTER — Ambulatory Visit (INDEPENDENT_AMBULATORY_CARE_PROVIDER_SITE_OTHER): Payer: 59 | Admitting: Sports Medicine

## 2016-02-26 DIAGNOSIS — K529 Noninfective gastroenteritis and colitis, unspecified: Secondary | ICD-10-CM | POA: Diagnosis not present

## 2016-02-26 MED ORDER — PREDNISONE 50 MG PO TABS: ORAL_TABLET | ORAL | Status: DC

## 2016-02-26 NOTE — Progress Notes (Signed)
  Subjective:    CC:  Follow-up  HPI:  Enteritis: CT did show edema in the proximal  Loops of the small bowel.  She has been through a course of Cipro, Flagyl, labs were negative with the exception of some anemia and prerenal azotemia. Overall her renal function improved, which she has had persistent nausea and difficulty keeping fluids in. We have given her several liters of IV fluids here in the office. She does improve but only slowly. At this point she is requesting a second opinion which I think is appropriate, she will continue with her antibiotics. She is agreeable to try course of steroids today.  Past medical history, Surgical history, Family history not pertinant except as noted below, Social history, Allergies, and medications have been entered into the medical record, reviewed, and no changes needed.   Review of Systems: No fevers, chills, night sweats, weight loss, chest pain, or shortness of breath.   Objective:    General: Well Developed, well nourished, and in no acute distress.  Neuro: Alert and oriented x3, extra-ocular muscles intact, sensation grossly intact.  HEENT: Normocephalic, atraumatic, pupils equal round reactive to light, neck supple, no masses, no lymphadenopathy, thyroid nonpalpable.  Skin: Warm and dry, no rashes. Cardiac: Regular rate and rhythm, no murmurs rubs or gallops, no lower extremity edema.  Respiratory: Clear to auscultation bilaterally. Not using accessory muscles, speaking in full sentences.  2 angiocatheter placement were attempted in the left hand and the right Cubital fossa without success.  Impression and Recommendations:

## 2016-02-26 NOTE — Assessment & Plan Note (Addendum)
Continues to improve slowly, she did have significant edema and enteritis in the proximal loops of the small bowel. At this point we are going to enlist the help of gastroenterology.  adding prednisone Checking routine blood work again.

## 2016-02-27 LAB — CBC WITH DIFFERENTIAL/PLATELET
Basophils Absolute: 0 K/uL (ref 0.0–0.1)
Basophils Relative: 0 % (ref 0–1)
Eosinophils Absolute: 0.1 K/uL (ref 0.0–0.7)
Eosinophils Relative: 2 % (ref 0–5)
HCT: 36.4 % (ref 36.0–46.0)
Hemoglobin: 11.1 g/dL — ABNORMAL LOW (ref 12.0–15.0)
Lymphocytes Relative: 42 % (ref 12–46)
Lymphs Abs: 2.2 K/uL (ref 0.7–4.0)
MCH: 21.9 pg — ABNORMAL LOW (ref 26.0–34.0)
MCHC: 30.5 g/dL (ref 30.0–36.0)
MCV: 71.7 fL — ABNORMAL LOW (ref 78.0–100.0)
MPV: 9.7 fL (ref 8.6–12.4)
Monocytes Absolute: 0.5 10*3/uL (ref 0.1–1.0)
Monocytes Relative: 10 % (ref 3–12)
Neutro Abs: 2.4 10*3/uL (ref 1.7–7.7)
Neutrophils Relative %: 46 % (ref 43–77)
Platelets: 331 K/uL (ref 150–400)
RBC: 5.08 MIL/uL (ref 3.87–5.11)
RDW: 15.5 % (ref 11.5–15.5)
WBC: 5.3 10*3/uL (ref 4.0–10.5)

## 2016-02-27 LAB — COMPREHENSIVE METABOLIC PANEL
AST: 34 U/L (ref 10–35)
Albumin: 3.3 g/dL — ABNORMAL LOW (ref 3.6–5.1)
CO2: 28 mmol/L (ref 20–31)
Creat: 1.18 mg/dL — ABNORMAL HIGH (ref 0.50–0.99)
Glucose, Bld: 101 mg/dL — ABNORMAL HIGH (ref 65–99)
Sodium: 139 mmol/L (ref 135–146)
Total Bilirubin: 0.4 mg/dL (ref 0.2–1.2)

## 2016-02-27 LAB — COMPREHENSIVE METABOLIC PANEL WITH GFR
ALT: 11 U/L (ref 6–29)
Alkaline Phosphatase: 66 U/L (ref 33–130)
BUN: 9 mg/dL (ref 7–25)
Calcium: 9.7 mg/dL (ref 8.6–10.4)
Chloride: 98 mmol/L (ref 98–110)
Potassium: 3.8 mmol/L (ref 3.5–5.3)
Total Protein: 7 g/dL (ref 6.1–8.1)

## 2016-02-27 LAB — PHOSPHORUS: Phosphorus: 2.3 mg/dL — ABNORMAL LOW (ref 2.5–4.5)

## 2016-02-27 LAB — MAGNESIUM: Magnesium: 1.7 mg/dL (ref 1.5–2.5)

## 2016-02-27 LAB — AMYLASE: Amylase: 26 U/L (ref 0–105)

## 2016-02-27 LAB — LIPASE: Lipase: 14 U/L (ref 7–60)

## 2016-02-27 LAB — PATHOLOGIST SMEAR REVIEW

## 2016-02-29 LAB — LACTIC ACID, PLASMA: LACTIC ACID: 13 mg/dL (ref 4–16)

## 2016-03-07 ENCOUNTER — Telehealth: Payer: Self-pay

## 2016-03-07 MED ORDER — HYDROXYZINE HCL 50 MG PO TABS: 50.0000 mg | ORAL_TABLET | Freq: Every evening | ORAL | Status: DC | PRN

## 2016-03-07 NOTE — Addendum Note (Signed)
Addended by: Elizabeth Sauer on: 03/07/2016 05:07 PM   Modules accepted: Orders

## 2016-03-07 NOTE — Telephone Encounter (Signed)
Pt is due to have a small bowel biopsy in the a.m. And would like to know if she can have something so that she can have a good night sleep. Please advise.

## 2016-03-07 NOTE — Telephone Encounter (Signed)
Calling in hydroxyzine

## 2016-03-11 ENCOUNTER — Encounter: Payer: Self-pay | Admitting: Sports Medicine

## 2016-03-11 ENCOUNTER — Ambulatory Visit (INDEPENDENT_AMBULATORY_CARE_PROVIDER_SITE_OTHER): Payer: 59 | Admitting: Sports Medicine

## 2016-03-11 VITALS — BP 133/88 | HR 79 | Resp 18 | Wt 152.4 lb

## 2016-03-11 DIAGNOSIS — K529 Noninfective gastroenteritis and colitis, unspecified: Secondary | ICD-10-CM | POA: Diagnosis not present

## 2016-03-11 NOTE — Assessment & Plan Note (Signed)
Symptoms are finally resolving, she also has follow-up with gastroenterology and they were awaiting small bowel biopsy results

## 2016-03-11 NOTE — Progress Notes (Signed)
  Subjective:    CC: follow-up  HPI: This is a pleasant 60 year old female who is here for follow-up of severe gastroenteritis confirmed on CT scan that ended up with acute prerenal azotemia. She got a bunch of IV fluids, and has since improved significantly and is feeling more like herself now. She also had an upper endoscopy that was unrevealing however there still awaiting small bowel biopsies.  Past medical history, Surgical history, Family history not pertinant except as noted below, Social history, Allergies, and medications have been entered into the medical record, reviewed, and no changes needed.   Review of Systems: No fevers, chills, night sweats, weight loss, chest pain, or shortness of breath.   Objective:    General: Well Developed, well nourished, and in no acute distress.  Neuro: Alert and oriented x3, extra-ocular muscles intact, sensation grossly intact.  HEENT: Normocephalic, atraumatic, pupils equal round reactive to light, neck supple, no masses, no lymphadenopathy, thyroid nonpalpable.  Skin: Warm and dry, no rashes. Cardiac: Regular rate and rhythm, no murmurs rubs or gallops, no lower extremity edema.  Respiratory: Clear to auscultation bilaterally. Not using accessory muscles, speaking in full sentences.  Impression and Recommendations:

## 2016-03-26 ENCOUNTER — Other Ambulatory Visit: Payer: Self-pay | Admitting: Gastroenterology

## 2016-03-26 DIAGNOSIS — R1084 Generalized abdominal pain: Secondary | ICD-10-CM

## 2016-03-26 DIAGNOSIS — R112 Nausea with vomiting, unspecified: Secondary | ICD-10-CM

## 2016-04-03 ENCOUNTER — Other Ambulatory Visit: Payer: Self-pay | Admitting: Sports Medicine

## 2016-04-04 ENCOUNTER — Ambulatory Visit
Admission: RE | Admit: 2016-04-04 | Discharge: 2016-04-04 | Disposition: A | Discharge status: Home / Self Care | Payer: 59 | Source: Ambulatory Visit | Attending: Gastroenterology | Admitting: Gastroenterology

## 2016-04-04 DIAGNOSIS — R1084 Generalized abdominal pain: Secondary | ICD-10-CM

## 2016-04-04 DIAGNOSIS — R112 Nausea with vomiting, unspecified: Secondary | ICD-10-CM

## 2016-04-04 MED ORDER — IOPAMIDOL (ISOVUE-300) INJECTION 61%
100.0000 mL | Freq: Once | INTRAVENOUS | Status: AC | PRN
  Administered 2016-04-04: 100 mL via INTRAVENOUS

## 2016-04-26 ENCOUNTER — Encounter: Payer: Self-pay | Admitting: Sports Medicine

## 2016-05-12 ENCOUNTER — Emergency Department (HOSPITAL_COMMUNITY): Payer: 59

## 2016-05-12 ENCOUNTER — Encounter (HOSPITAL_COMMUNITY): Payer: Self-pay | Admitting: *Deleted

## 2016-05-12 ENCOUNTER — Emergency Department (HOSPITAL_COMMUNITY)
Admission: EM | Admit: 2016-05-12 | Discharge: 2016-05-12 | Disposition: A | Discharge status: Home / Self Care | Payer: 59 | Attending: Emergency Medicine | Admitting: Emergency Medicine

## 2016-05-12 DIAGNOSIS — R1010 Upper abdominal pain, unspecified: Secondary | ICD-10-CM

## 2016-05-12 DIAGNOSIS — K8020 Calculus of gallbladder without cholecystitis without obstruction: Secondary | ICD-10-CM | POA: Diagnosis not present

## 2016-05-12 DIAGNOSIS — I10 Essential (primary) hypertension: Secondary | ICD-10-CM | POA: Diagnosis not present

## 2016-05-12 DIAGNOSIS — R109 Unspecified abdominal pain: Secondary | ICD-10-CM

## 2016-05-12 DIAGNOSIS — Z79899 Other long term (current) drug therapy: Secondary | ICD-10-CM | POA: Insufficient documentation

## 2016-05-12 DIAGNOSIS — Z7982 Long term (current) use of aspirin: Secondary | ICD-10-CM | POA: Insufficient documentation

## 2016-05-12 DIAGNOSIS — R112 Nausea with vomiting, unspecified: Secondary | ICD-10-CM | POA: Diagnosis present

## 2016-05-12 LAB — URINALYSIS, ROUTINE W REFLEX MICROSCOPIC
GLUCOSE, UA: NEGATIVE mg/dL
Hgb urine dipstick: NEGATIVE
NITRITE: NEGATIVE
PH: 6 (ref 5.0–8.0)
Protein, ur: NEGATIVE mg/dL
SPECIFIC GRAVITY, URINE: 1.013 (ref 1.005–1.030)

## 2016-05-12 LAB — COMPREHENSIVE METABOLIC PANEL
ALBUMIN: 4 g/dL (ref 3.5–5.0)
ALK PHOS: 61 U/L (ref 38–126)
ALT: 9 U/L — AB (ref 14–54)
ANION GAP: 16 — AB (ref 5–15)
AST: 33 U/L (ref 15–41)
BILIRUBIN TOTAL: 1.7 mg/dL — AB (ref 0.3–1.2)
BUN: 6 mg/dL (ref 6–20)
CALCIUM: 9.8 mg/dL (ref 8.9–10.3)
CO2: 19 mmol/L — AB (ref 22–32)
Chloride: 99 mmol/L — ABNORMAL LOW (ref 101–111)
Creatinine, Ser: 1.15 mg/dL — ABNORMAL HIGH (ref 0.44–1.00)
GFR calc Af Amer: 58 mL/min — ABNORMAL LOW (ref >60)
GFR calc non Af Amer: 50 mL/min — ABNORMAL LOW (ref >60)
GLUCOSE: 71 mg/dL (ref 65–99)
Potassium: 3.6 mmol/L (ref 3.5–5.1)
SODIUM: 134 mmol/L — AB (ref 135–145)
TOTAL PROTEIN: 8.2 g/dL — AB (ref 6.5–8.1)

## 2016-05-12 LAB — CBC WITH DIFFERENTIAL/PLATELET
BASOS ABS: 0 10*3/uL (ref 0.0–0.1)
BASOS PCT: 1 %
EOS ABS: 0.1 10*3/uL (ref 0.0–0.7)
Eosinophils Relative: 2 %
HEMATOCRIT: 34.5 % — AB (ref 36.0–46.0)
HEMOGLOBIN: 11.3 g/dL — AB (ref 12.0–15.0)
Lymphocytes Relative: 39 %
Lymphs Abs: 2 10*3/uL (ref 0.7–4.0)
MCH: 23 pg — ABNORMAL LOW (ref 26.0–34.0)
MCHC: 32.8 g/dL (ref 30.0–36.0)
MCV: 70.3 fL — ABNORMAL LOW (ref 78.0–100.0)
MONOS PCT: 9 %
Monocytes Absolute: 0.5 10*3/uL (ref 0.1–1.0)
NEUTROS ABS: 2.5 10*3/uL (ref 1.7–7.7)
NEUTROS PCT: 49 %
Platelets: 253 10*3/uL (ref 150–400)
RBC: 4.91 MIL/uL (ref 3.87–5.11)
RDW: 16.6 % — ABNORMAL HIGH (ref 11.5–15.5)
WBC: 5.1 10*3/uL (ref 4.0–10.5)

## 2016-05-12 LAB — URINE MICROSCOPIC-ADD ON: RBC / HPF: NONE SEEN RBC/hpf (ref 0–5)

## 2016-05-12 LAB — LIPASE, BLOOD: Lipase: 23 U/L (ref 11–51)

## 2016-05-12 MED ORDER — HYDROCODONE-ACETAMINOPHEN 5-325 MG PO TABS: 1.0000 | ORAL_TABLET | ORAL | Status: DC | PRN

## 2016-05-12 MED ORDER — DIATRIZOATE MEGLUMINE & SODIUM 66-10 % PO SOLN
15.0000 mL | Freq: Once | ORAL | Status: AC
  Administered 2016-05-12: 15 mL via ORAL

## 2016-05-12 MED ORDER — MORPHINE SULFATE (PF) 4 MG/ML IV SOLN
4.0000 mg | Freq: Once | INTRAVENOUS | Status: AC
  Administered 2016-05-12: 4 mg via INTRAVENOUS
  Filled 2016-05-12: qty 1

## 2016-05-12 MED ORDER — ONDANSETRON 8 MG PO TBDP: 8.0000 mg | ORAL_TABLET | Freq: Three times a day (TID) | ORAL | Status: DC | PRN

## 2016-05-12 MED ORDER — METOCLOPRAMIDE HCL 5 MG/ML IJ SOLN
10.0000 mg | Freq: Once | INTRAMUSCULAR | Status: AC
  Administered 2016-05-12: 10 mg via INTRAVENOUS
  Filled 2016-05-12: qty 2

## 2016-05-12 MED ORDER — PROMETHAZINE HCL 25 MG PO TABS: 25.0000 mg | ORAL_TABLET | Freq: Four times a day (QID) | ORAL | Status: DC | PRN

## 2016-05-12 MED ORDER — SODIUM CHLORIDE 0.9 % IV SOLN
1000.0000 mL | Freq: Once | INTRAVENOUS | Status: AC
  Administered 2016-05-12: 1000 mL via INTRAVENOUS

## 2016-05-12 MED ORDER — SODIUM CHLORIDE 0.9 % IV SOLN
1000.0000 mL | INTRAVENOUS | Status: DC
  Administered 2016-05-12: 1000 mL via INTRAVENOUS

## 2016-05-12 NOTE — Discharge Instructions (Signed)
Biliary Colic Biliary colic is a pain in the upper abdomen. The pain:  Is usually felt on the right side of the abdomen, but it may also be felt in the center of the abdomen, just below the breastbone (sternum).  May spread back toward the right shoulder blade.  May be steady or irregular.  May be accompanied by nausea and vomiting. Most of the time, the pain goes away in 1-5 hours. After the most intense pain passes, the abdomen may continue to ache mildly for about 24 hours. Biliary colic is caused by a blockage in the bile duct. The bile duct is a pathway that carries bile--a liquid that helps to digest fats--from the gallbladder to the small intestine. Biliary colic usually occurs after eating, when the digestive system demands bile. The pain develops when muscle cells contract forcefully to try to move the blockage so that bile can get by. HOME CARE INSTRUCTIONS  Take medicines only as directed by your health care provider.  Drink enough fluid to keep your urine clear or pale yellow.  Avoid fatty, greasy, and fried foods. These kinds of foods increase your body's demand for bile.  Avoid any foods that make your pain worse.  Avoid overeating.  Avoid having a large meal after fasting. SEEK MEDICAL CARE IF:  You develop a fever.  Your pain gets worse.  You vomit.  You develop nausea that prevents you from eating and drinking. SEEK IMMEDIATE MEDICAL CARE IF:  You suddenly develop a fever and shaking chills.  You develop a yellowish discoloration (jaundice) of:  Skin.  Whites of the eyes.  Mucous membranes.  You have continuous or severe pain that is not relieved with medicines.  You have nausea and vomiting that is not relieved with medicines.  You develop dizziness or you faint.   This information is not intended to replace advice given to you by your health care provider. Make sure you discuss any questions you have with your health care provider.   Document  Released: 05/05/2006 Document Revised: 04/18/2015 Document Reviewed: 09/13/2014 Elsevier Interactive Patient Education 2016 [employer]  Abdominal Pain, Adult Many things can cause abdominal pain. Usually, abdominal pain is not caused by a disease and will improve without treatment. It can often be observed and treated at home. Your health care provider will do a physical exam and possibly order blood tests and X-rays to help determine the seriousness of your pain. However, in many cases, more time must pass before a clear cause of the pain can be found. Before that point, your health care provider may not know if you need more testing or further treatment. HOME CARE INSTRUCTIONS Monitor your abdominal pain for any changes. The following actions may help to alleviate any discomfort you are experiencing:  Only take over-the-counter or prescription medicines as directed by your health care provider.  Do not take laxatives unless directed to do so by your health care provider.  Try a clear liquid diet (broth, tea, or water) as directed by your health care provider. Slowly move to a bland diet as tolerated. SEEK MEDICAL CARE IF:  You have unexplained abdominal pain.  You have abdominal pain associated with nausea or diarrhea.  You have pain when you urinate or have a bowel movement.  You experience abdominal pain that wakes you in the night.  You have abdominal pain that is worsened or improved by eating food.  You have abdominal pain that is worsened with eating fatty foods.  You  have a fever. SEEK IMMEDIATE MEDICAL CARE IF:  Your pain does not go away within 2 hours.  You keep throwing up (vomiting).  Your pain is felt only in portions of the abdomen, such as the right side or the left lower portion of the abdomen.  You pass bloody or black tarry stools. MAKE SURE YOU:  Understand these instructions.  Will watch your condition.  Will get help right away if you are not  doing well or get worse.   This information is not intended to replace advice given to you by your health care provider. Make sure you discuss any questions you have with your health care provider.   Document Released: 09/11/2005 Document Revised: 08/23/2015 Document Reviewed: 08/11/2013 Elsevier Interactive Patient Education [employer].

## 2016-05-12 NOTE — ED Notes (Signed)
Pt awaiting US

## 2016-05-12 NOTE — ED Notes (Signed)
Pt states that she has ben seen multiple time for gastroenteritis; pt c/o N./V; pt states that she is vomiting at least 4 times a day; pt states that she "feels dehydrated"; pt states that she is scheduled for a colonoscopy on Tues but feels like she needs to be evaluated for dehydration; denies diarrhea; son reports pt is drinking water and eating watermelon but not "real food" ; pt c/o dizziness when attempting to get around her house

## 2016-05-12 NOTE — ED Provider Notes (Signed)
CSN: [id number]     Arrival date & time 05/12/16  [id number] History   First MD Initiated Contact with Patient 05/12/16 [phone]     Chief Complaint  Patient presents with  . Emesis      HPI Patient presents emergency department with intermittent nausea vomiting abdominal pain since January 2017.  She's been worked up with CT imaging 2 and has had an endoscopy by Dr. Benson Norway.  She is currently being evaluated and seen by gastroenterologist at [facility] for these recurrent symptoms.  She'll have episodes of nausea vomiting abdominal pain the last several days and then she may have several days of resolution of all symptoms.  His problem continues to wax and wane.  She does however report that the pain seems to be more constant with nausea and vomiting being the intermittent issue.  She's had 24 pounds of weight loss since the beginning of the year.  Initial CT imaging demonstrated may be some thickening of the proximal jejunal small bowel and she underwent biopsies of this area by Dr. Benson Norway which represented possible celiac disease.  She's tried a gluten-free diet without improvement in her symptoms.  Repeat CT imaging performed early April 2017 demonstrated resolution of the jejunal thickening.  She is scheduled for colonoscopy by her gastroenterologist to [place] followed by possible capsule endoscopy.  Patient states he feels dehydrated at this time.  No hematemesis described.  Denies diarrhea.  She reports dizziness with ambulation.   Past Medical History  Diagnosis Date  . Hypertension   . Hyperlipidemia    Past Surgical History  Procedure Laterality Date  . Colonoscopy     Family History  Problem Relation Age of Onset  . Stroke Mother   . Hyperlipidemia Mother   . Diabetes Mother    Social History  Substance Use Topics  . Smoking status: Never Smoker   . Smokeless tobacco: Never Used  . Alcohol Use: No   OB History    Gravida Para Term Preterm AB TAB SAB Ectopic Multiple Living   2  1   1           Review of Systems  All other systems reviewed and are negative.     Allergies  Isovue  Home Medications   Prior to Admission medications   Medication Sig Start Date End Date Taking? Authorizing Provider  amLODipine (NORVASC) 10 MG tablet Take 1 tablet (10 mg total) by mouth daily. 01/26/16   Gloriann Loan, PA-C  aspirin EC 81 MG tablet Take 1 tablet (81 mg total) by mouth daily. 03/04/13   Silverio Decamp, MD  atorvastatin (LIPITOR) 40 MG tablet TAKE ONE TABLET BY MOUTH ONCE DAILY 09/21/14   Silverio Decamp, MD  hydrOXYzine (ATARAX/VISTARIL) 50 MG tablet Take 1 tablet (50 mg total) by mouth at bedtime and may repeat dose one time if needed. 03/07/16   Silverio Decamp, MD  lisinopril-hydrochlorothiazide (PRINZIDE,ZESTORETIC) 20-25 MG tablet Take 1 tablet by mouth daily. Patient not taking: Reported on 02/20/2016 01/26/16   Gloriann Loan, PA-C  pantoprazole (PROTONIX) 40 MG tablet Take 1 tablet (40 mg total) by mouth 2 (two) times daily. 02/02/16   Charlynne Cousins, MD  potassium chloride SA (K-DUR,KLOR-CON) 20 MEQ tablet Take 2 tablets (40 mEq total) by mouth daily. 02/16/16   Silverio Decamp, MD   BP 109/84 mmHg  Pulse 95  Temp(Src) 98.1 F (36.7 C) (Oral)  Resp 18  SpO2 100% Physical Exam  Constitutional: She is oriented  to person, place, and time. She appears well-developed and well-nourished. No distress.  HENT:  Head: Normocephalic and atraumatic.  Eyes: EOM are normal.  Neck: Normal range of motion.  Cardiovascular: Normal rate, regular rhythm and normal heart sounds.   Pulmonary/Chest: Effort normal and breath sounds normal.  Abdominal: Soft. She exhibits no distension.  Mild generalized abdominal tenderness without guarding or rebound.  Musculoskeletal: Normal range of motion.  Neurological: She is alert and oriented to person, place, and time.  Skin: Skin is warm and dry.  Psychiatric: She has a normal mood and affect. Judgment normal.   Nursing note and vitals reviewed.   ED Course  Procedures (including critical care time) Labs Review Labs Reviewed  CBC WITH DIFFERENTIAL/PLATELET - Abnormal; Notable for the following:    Hemoglobin 11.3 (*)    HCT 34.5 (*)    MCV 70.3 (*)    MCH 23.0 (*)    RDW 16.6 (*)    All other components within normal limits  COMPREHENSIVE METABOLIC PANEL - Abnormal; Notable for the following:    Sodium 134 (*)    Chloride 99 (*)    CO2 19 (*)    Creatinine, Ser 1.15 (*)    Total Protein 8.2 (*)    ALT 9 (*)    Total Bilirubin 1.7 (*)    GFR calc non Af Amer 50 (*)    GFR calc Af Amer 58 (*)    Anion gap 16 (*)    All other components within normal limits  URINALYSIS, ROUTINE W REFLEX MICROSCOPIC (NOT AT [facility]) - Abnormal; Notable for the following:    APPearance CLOUDY (*)    Bilirubin Urine MODERATE (*)    Ketones, ur >80 (*)    Leukocytes, UA MODERATE (*)    All other components within normal limits  URINE MICROSCOPIC-ADD ON - Abnormal; Notable for the following:    Squamous Epithelial / LPF 0-5 (*)    Bacteria, UA FEW (*)    Casts HYALINE CASTS (*)    All other components within normal limits  LIPASE, BLOOD    Imaging Review Ct Abdomen Pelvis Wo Contrast  05/12/2016  CLINICAL DATA:  60 year old female with persistent nausea and vomiting as well as possible dehydration. EXAM: CT ABDOMEN AND PELVIS WITHOUT CONTRAST TECHNIQUE: Multidetector CT imaging of the abdomen and pelvis was performed following the standard protocol without IV contrast. Unfortunately, ingestion of oral contrast was limited secondary to emesis. COMPARISON:  CT scan of the abdomen and pelvis 04/04/2016 FINDINGS: Lower chest:  No acute findings. Hepatobiliary: No mass visualized on this un-enhanced exam. High attenuation material layering within the gallbladder consistent with sludge and/or small stones. No gallbladder wall thickening, distention or pericholecystic fluid. Pancreas: No mass or inflammatory  process identified on this un-enhanced exam. Spleen: Within normal limits in size. Adrenals/Urinary Tract: No evidence of urolithiasis or hydronephrosis. No definite mass visualized on this un-enhanced exam. Stomach/Bowel: No evidence of obstruction, inflammatory process, or abnormal fluid collections. Vascular/Lymphatic: No pathologically enlarged lymph nodes. No evidence of abdominal aortic aneurysm. Reproductive: Fibroid uterus.  Unremarkable bladder. Other: None. Musculoskeletal:  No suspicious bone lesions identified. IMPRESSION: 1. Sludge and/or stones within the gallbladder lumen. No secondary signs of acute cholecystitis. 2. Otherwise, unremarkable noncontrast CT scan of the abdomen and pelvis. No evidence of obstruction, inflammation or free fluid. Electronically Signed   By: Jacqulynn Cadet M.D.   On: 05/12/2016 09:47   US Abdomen Limited Ruq  05/12/2016  CLINICAL DATA:  Right upper  quadrant pain. EXAM: US ABDOMEN LIMITED - RIGHT UPPER QUADRANT COMPARISON:  None. FINDINGS: Gallbladder: Gallbladder sludge with possible 6 mm gallstone. Negative sonographic Murphy sign. No sonographic Murphy sign. Common bile duct: Diameter: 4 mm Liver: No focal lesion identified. Within normal limits in parenchymal echogenicity. IMPRESSION: Gallbladder sludge with possible 6 mm gallstone. No sonographic evidence of acute cholecystitis. Electronically Signed   By: Kathreen Devoid   On: 05/12/2016 13:59   I have personally reviewed and evaluated these images and lab results as part of my medical decision-making.   EKG Interpretation None      MDM   Final diagnoses:  Upper abdominal pain  Recurrent abdominal pain  Calculus of gallbladder without cholecystitis without obstruction    2:21 PM Patient feels better this time.  She does have a new diagnosis of cholelithiasis with sludge and possible 6 mm gallstone.  This is never been seen or evaluated before.  This duct leak be the cause of her recurrent nausea  vomiting and upper abdominal pain over the past several months.  She'll need outpatient general surgery follow-up as she may carefully benefit from cholecystectomy.  Otherwise close follow-up with primary care physician and outpatient GI but I definitely think the gallbladder needs to be evaluated and strongly considered to be removed    Jola Schmidt, MD 05/12/16 1422

## 2016-05-15 ENCOUNTER — Ambulatory Visit (INDEPENDENT_AMBULATORY_CARE_PROVIDER_SITE_OTHER): Payer: 59 | Admitting: Sports Medicine

## 2016-05-15 DIAGNOSIS — R1011 Right upper quadrant pain: Secondary | ICD-10-CM

## 2016-05-15 MED ORDER — PROMETHAZINE HCL 25 MG RE SUPP: 25.0000 mg | Freq: Four times a day (QID) | RECTAL | Status: DC | PRN

## 2016-05-15 NOTE — Progress Notes (Signed)
  Subjective:    CC: nausea  HPI: This is a pleasant 60 year old female, she has been seeing the gastroenterologist for severe and recurrent nausea and vomiting from gastritis. This was confirmed on a CT scan with significant gastric thickening. Initially she did better but now is having recurrence of epigastric pain with nausea and vomiting, she did have some biliary sludge and gallstones on both the CT scan as well as ultrasound. Unable to keep much down orally. No constitutional symptoms.  Past medical history, Surgical history, Family history not pertinant except as noted below, Social history, Allergies, and medications have been entered into the medical record, reviewed, and no changes needed.   Review of Systems: No fevers, chills, night sweats, weight loss, chest pain, or shortness of breath.   Objective:    General: Well Developed, well nourished, and in no acute distress.  Neuro: Alert and oriented x3, extra-ocular muscles intact, sensation grossly intact.  HEENT: Normocephalic, atraumatic, pupils equal round reactive to light, neck supple, no masses, no lymphadenopathy, thyroid nonpalpable.  Skin: Warm and dry, no rashes. Cardiac: Regular rate and rhythm, no murmurs rubs or gallops, no lower extremity edema.  Respiratory: Clear to auscultation bilaterally. Not using accessory muscles, speaking in full sentences. Abdomen: Soft, tender to palpation in the epigastrium in the right upper quadrant, nondistended, no bowel sounds, positive Murphy sign.  1 normal saline run in the right cubital vein, also 25 mg of IV Phenergan pushed slowly.  Impression and Recommendations:    I spent 40 minutes with this patient, greater than 50% was face-to-face time counseling regarding the above diagnoses  In addition to the above time another 30 minutes was used.

## 2016-05-15 NOTE — Patient Instructions (Addendum)
6:45 Monday June 5th HIDA scan at [facility], need to be fasting and no pain meds. Dr. Redmond Pulling Thursday June 8 [facility] at [place].

## 2016-05-15 NOTE — Assessment & Plan Note (Addendum)
6:45 Monday June 5th HIDA Dr. Redmond Pulling Thursday June 8 [facility] 10am Suspect biliary colic, appointment made for HIDA scan and [facility] [place] surgery Continue anti emetics for now. 1 L IV fluid given with IV phenergan, Phenergan suppositories given.

## 2016-05-20 ENCOUNTER — Inpatient Hospital Stay (HOSPITAL_COMMUNITY)
Admission: EM | Admit: 2016-05-20 | Discharge: 2016-05-30 | DRG: 417 | Disposition: A | Discharge status: Home Health | Payer: 59 | Attending: Internal Medicine | Admitting: Internal Medicine

## 2016-05-20 ENCOUNTER — Other Ambulatory Visit: Payer: Self-pay

## 2016-05-20 ENCOUNTER — Observation Stay (HOSPITAL_COMMUNITY): Payer: 59

## 2016-05-20 ENCOUNTER — Encounter (HOSPITAL_COMMUNITY)
Admission: RE | Admit: 2016-05-20 | Discharge: 2016-05-20 | Disposition: A | Discharge status: Home / Self Care | Payer: 59 | Source: Ambulatory Visit | Attending: Sports Medicine | Admitting: Sports Medicine

## 2016-05-20 ENCOUNTER — Encounter (HOSPITAL_COMMUNITY): Payer: Self-pay | Admitting: Emergency Medicine

## 2016-05-20 DIAGNOSIS — K8010 Calculus of gallbladder with chronic cholecystitis without obstruction: Principal | ICD-10-CM | POA: Diagnosis present

## 2016-05-20 DIAGNOSIS — K3184 Gastroparesis: Secondary | ICD-10-CM | POA: Diagnosis present

## 2016-05-20 DIAGNOSIS — I959 Hypotension, unspecified: Secondary | ICD-10-CM | POA: Diagnosis present

## 2016-05-20 DIAGNOSIS — R55 Syncope and collapse: Secondary | ICD-10-CM | POA: Diagnosis not present

## 2016-05-20 DIAGNOSIS — D509 Iron deficiency anemia, unspecified: Secondary | ICD-10-CM | POA: Diagnosis present

## 2016-05-20 DIAGNOSIS — E43 Unspecified severe protein-calorie malnutrition: Secondary | ICD-10-CM | POA: Insufficient documentation

## 2016-05-20 DIAGNOSIS — K838 Other specified diseases of biliary tract: Secondary | ICD-10-CM

## 2016-05-20 DIAGNOSIS — Z6821 Body mass index (BMI) 21.0-21.9, adult: Secondary | ICD-10-CM

## 2016-05-20 DIAGNOSIS — Z9049 Acquired absence of other specified parts of digestive tract: Secondary | ICD-10-CM | POA: Insufficient documentation

## 2016-05-20 DIAGNOSIS — N179 Acute kidney failure, unspecified: Secondary | ICD-10-CM | POA: Diagnosis present

## 2016-05-20 DIAGNOSIS — E876 Hypokalemia: Secondary | ICD-10-CM | POA: Diagnosis present

## 2016-05-20 DIAGNOSIS — E785 Hyperlipidemia, unspecified: Secondary | ICD-10-CM | POA: Diagnosis present

## 2016-05-20 DIAGNOSIS — I9589 Other hypotension: Secondary | ICD-10-CM

## 2016-05-20 DIAGNOSIS — Z79899 Other long term (current) drug therapy: Secondary | ICD-10-CM

## 2016-05-20 DIAGNOSIS — I10 Essential (primary) hypertension: Secondary | ICD-10-CM | POA: Diagnosis present

## 2016-05-20 DIAGNOSIS — R5381 Other malaise: Secondary | ICD-10-CM | POA: Diagnosis not present

## 2016-05-20 DIAGNOSIS — K219 Gastro-esophageal reflux disease without esophagitis: Secondary | ICD-10-CM | POA: Diagnosis present

## 2016-05-20 DIAGNOSIS — K9189 Other postprocedural complications and disorders of digestive system: Secondary | ICD-10-CM

## 2016-05-20 DIAGNOSIS — K5900 Constipation, unspecified: Secondary | ICD-10-CM | POA: Diagnosis not present

## 2016-05-20 DIAGNOSIS — E860 Dehydration: Secondary | ICD-10-CM | POA: Diagnosis present

## 2016-05-20 DIAGNOSIS — R1011 Right upper quadrant pain: Secondary | ICD-10-CM

## 2016-05-20 DIAGNOSIS — R112 Nausea with vomiting, unspecified: Secondary | ICD-10-CM | POA: Diagnosis present

## 2016-05-20 DIAGNOSIS — R109 Unspecified abdominal pain: Secondary | ICD-10-CM | POA: Diagnosis not present

## 2016-05-20 DIAGNOSIS — Z7982 Long term (current) use of aspirin: Secondary | ICD-10-CM

## 2016-05-20 DIAGNOSIS — K8020 Calculus of gallbladder without cholecystitis without obstruction: Secondary | ICD-10-CM

## 2016-05-20 HISTORY — DX: Unspecified abdominal pain: R10.9

## 2016-05-20 LAB — COMPREHENSIVE METABOLIC PANEL
ALK PHOS: 60 U/L (ref 38–126)
ALT: 7 U/L — AB (ref 14–54)
AST: 25 U/L (ref 15–41)
Albumin: 3.6 g/dL (ref 3.5–5.0)
Anion gap: 18 — ABNORMAL HIGH (ref 5–15)
BILIRUBIN TOTAL: 1.2 mg/dL (ref 0.3–1.2)
BUN: 6 mg/dL (ref 6–20)
CALCIUM: 10 mg/dL (ref 8.9–10.3)
CHLORIDE: 96 mmol/L — AB (ref 101–111)
CO2: 20 mmol/L — ABNORMAL LOW (ref 22–32)
CREATININE: 1.5 mg/dL — AB (ref 0.44–1.00)
GFR, EST AFRICAN AMERICAN: 42 mL/min — AB (ref >60)
GFR, EST NON AFRICAN AMERICAN: 36 mL/min — AB (ref >60)
Glucose, Bld: 86 mg/dL (ref 65–99)
Potassium: 2.4 mmol/L — CL (ref 3.5–5.1)
Sodium: 134 mmol/L — ABNORMAL LOW (ref 135–145)
TOTAL PROTEIN: 7.6 g/dL (ref 6.5–8.1)

## 2016-05-20 LAB — IRON AND TIBC
Iron: 68 ug/dL (ref 28–170)
Saturation Ratios: 41 % — ABNORMAL HIGH (ref 10.4–31.8)
TIBC: 165 ug/dL — ABNORMAL LOW (ref 250–450)
UIBC: 97 ug/dL

## 2016-05-20 LAB — URINE MICROSCOPIC-ADD ON
BACTERIA UA: NONE SEEN
RBC / HPF: NONE SEEN RBC/hpf (ref 0–5)
SQUAMOUS EPITHELIAL / LPF: NONE SEEN

## 2016-05-20 LAB — CBC WITH DIFFERENTIAL/PLATELET
BASOS PCT: 0 %
Basophils Absolute: 0 10*3/uL (ref 0.0–0.1)
EOS PCT: 2 %
Eosinophils Absolute: 0.1 10*3/uL (ref 0.0–0.7)
HEMATOCRIT: 31.5 % — AB (ref 36.0–46.0)
HEMOGLOBIN: 10.6 g/dL — AB (ref 12.0–15.0)
Lymphocytes Relative: 48 %
Lymphs Abs: 2.8 10*3/uL (ref 0.7–4.0)
MCH: 22.5 pg — AB (ref 26.0–34.0)
MCHC: 33.7 g/dL (ref 30.0–36.0)
MCV: 66.9 fL — AB (ref 78.0–100.0)
MONOS PCT: 8 %
Monocytes Absolute: 0.5 10*3/uL (ref 0.1–1.0)
NEUTROS PCT: 42 %
Neutro Abs: 2.4 10*3/uL (ref 1.7–7.7)
Platelets: 210 10*3/uL (ref 150–400)
RBC: 4.71 MIL/uL (ref 3.87–5.11)
RDW: 15.5 % (ref 11.5–15.5)
WBC: 5.8 10*3/uL (ref 4.0–10.5)

## 2016-05-20 LAB — VITAMIN B12: Vitamin B-12: 424 pg/mL (ref 180–914)

## 2016-05-20 LAB — RETICULOCYTES
RBC.: 4.22 MIL/uL (ref 3.87–5.11)
Retic Ct Pct: <0.4 % — ABNORMAL LOW (ref 0.4–3.1)

## 2016-05-20 LAB — URINALYSIS, ROUTINE W REFLEX MICROSCOPIC
Glucose, UA: NEGATIVE mg/dL
HGB URINE DIPSTICK: NEGATIVE
NITRITE: NEGATIVE
Protein, ur: NEGATIVE mg/dL
SPECIFIC GRAVITY, URINE: 1.011 (ref 1.005–1.030)
pH: 6 (ref 5.0–8.0)

## 2016-05-20 LAB — I-STAT CHEM 8, ED
BUN: 4 mg/dL — AB (ref 6–20)
CALCIUM ION: 1.2 mmol/L (ref 1.13–1.30)
Chloride: 96 mmol/L — ABNORMAL LOW (ref 101–111)
Creatinine, Ser: 1.1 mg/dL — ABNORMAL HIGH (ref 0.44–1.00)
Glucose, Bld: 82 mg/dL (ref 65–99)
HCT: 36 % (ref 36.0–46.0)
HEMOGLOBIN: 12.2 g/dL (ref 12.0–15.0)
Potassium: 2.5 mmol/L — CL (ref 3.5–5.1)
SODIUM: 137 mmol/L (ref 135–145)
TCO2: 22 mmol/L (ref 0–100)

## 2016-05-20 LAB — TYPE AND SCREEN
ABO/RH(D): O POS
ANTIBODY SCREEN: NEGATIVE

## 2016-05-20 LAB — CBG MONITORING, ED: Glucose-Capillary: 85 mg/dL (ref 65–99)

## 2016-05-20 LAB — TROPONIN I: Troponin I: <0.03 ng/mL (ref <0.031)

## 2016-05-20 LAB — FERRITIN: Ferritin: 170 ng/mL (ref 11–307)

## 2016-05-20 LAB — FOLATE: Folate: 8 ng/mL (ref >5.9)

## 2016-05-20 LAB — ABO/RH: ABO/RH(D): O POS

## 2016-05-20 LAB — MAGNESIUM: Magnesium: 1.7 mg/dL (ref 1.7–2.4)

## 2016-05-20 MED ORDER — POTASSIUM CHLORIDE CRYS ER 20 MEQ PO TBCR: 40.0000 meq | EXTENDED_RELEASE_TABLET | Freq: Two times a day (BID) | ORAL | Status: DC

## 2016-05-20 MED ORDER — SODIUM CHLORIDE 0.9 % IV BOLUS (SEPSIS)
1000.0000 mL | Freq: Once | INTRAVENOUS | Status: AC
  Administered 2016-05-20: 1000 mL via INTRAVENOUS

## 2016-05-20 MED ORDER — ONDANSETRON HCL 4 MG/2ML IJ SOLN
4.0000 mg | Freq: Four times a day (QID) | INTRAMUSCULAR | Status: DC | PRN
  Administered 2016-05-21 – 2016-05-28 (×8): 4 mg via INTRAVENOUS
  Filled 2016-05-20 (×8): qty 2

## 2016-05-20 MED ORDER — POTASSIUM CHLORIDE CRYS ER 20 MEQ PO TBCR
40.0000 meq | EXTENDED_RELEASE_TABLET | Freq: Once | ORAL | Status: DC
  Filled 2016-05-20: qty 2

## 2016-05-20 MED ORDER — TECHNETIUM TC 99M MEBROFENIN IV KIT
5.3800 | PACK | Freq: Once | INTRAVENOUS | Status: AC | PRN
  Administered 2016-05-20: 5 via INTRAVENOUS

## 2016-05-20 MED ORDER — POTASSIUM CHLORIDE 10 MEQ/100ML IV SOLN
INTRAVENOUS | Status: AC
  Administered 2016-05-20: 10 meq
  Filled 2016-05-20: qty 100

## 2016-05-20 MED ORDER — ASPIRIN 81 MG PO CHEW
324.0000 mg | CHEWABLE_TABLET | Freq: Once | ORAL | Status: AC
  Administered 2016-05-20: 324 mg via ORAL
  Filled 2016-05-20: qty 4

## 2016-05-20 MED ORDER — POTASSIUM CHLORIDE 10 MEQ/100ML IV SOLN
10.0000 meq | INTRAVENOUS | Status: AC
  Administered 2016-05-20 (×2): 10 meq via INTRAVENOUS
  Filled 2016-05-20 (×2): qty 100

## 2016-05-20 MED ORDER — POTASSIUM CHLORIDE 10 MEQ/100ML IV SOLN
10.0000 meq | INTRAVENOUS | Status: AC
  Administered 2016-05-20: 10 meq via INTRAVENOUS
  Filled 2016-05-20: qty 100

## 2016-05-20 MED ORDER — MAGNESIUM SULFATE 2 GM/50ML IV SOLN
2.0000 g | Freq: Once | INTRAVENOUS | Status: AC
  Administered 2016-05-20: 2 g via INTRAVENOUS
  Filled 2016-05-20: qty 50

## 2016-05-20 MED ORDER — HYDRALAZINE HCL 20 MG/ML IJ SOLN: 5.0000 mg | Freq: Four times a day (QID) | INTRAMUSCULAR | Status: DC | PRN

## 2016-05-20 MED ORDER — ONDANSETRON HCL 4 MG PO TABS: 4.0000 mg | ORAL_TABLET | Freq: Four times a day (QID) | ORAL | Status: DC | PRN

## 2016-05-20 MED ORDER — BOOST / RESOURCE BREEZE PO LIQD
1.0000 | Freq: Three times a day (TID) | ORAL | Status: DC
  Administered 2016-05-21: 1 via ORAL

## 2016-05-20 MED ORDER — MORPHINE SULFATE (PF) 2 MG/ML IV SOLN
1.0000 mg | INTRAVENOUS | Status: DC | PRN
  Administered 2016-05-21 – 2016-05-22 (×2): 1 mg via INTRAVENOUS
  Filled 2016-05-20 (×2): qty 1

## 2016-05-20 MED ORDER — ENOXAPARIN SODIUM 40 MG/0.4ML ~~LOC~~ SOLN
40.0000 mg | SUBCUTANEOUS | Status: DC
  Administered 2016-05-20 – 2016-05-29 (×9): 40 mg via SUBCUTANEOUS
  Filled 2016-05-20 (×10): qty 0.4

## 2016-05-20 MED ORDER — MORPHINE SULFATE (PF) 4 MG/ML IV SOLN
INTRAVENOUS | Status: AC
  Filled 2016-05-20: qty 1

## 2016-05-20 MED ORDER — MORPHINE SULFATE (PF) 4 MG/ML IV SOLN
2.3000 mg | Freq: Once | INTRAVENOUS | Status: AC
  Administered 2016-05-20: 2.3 mg via INTRAVENOUS

## 2016-05-20 MED ORDER — ONDANSETRON HCL 4 MG/2ML IJ SOLN
4.0000 mg | Freq: Once | INTRAMUSCULAR | Status: AC
  Administered 2016-05-20: 4 mg via INTRAVENOUS
  Filled 2016-05-20: qty 2

## 2016-05-20 MED ORDER — SODIUM CHLORIDE 0.9 % IV SOLN
INTRAVENOUS | Status: AC
  Administered 2016-05-20: 13:00:00 via INTRAVENOUS

## 2016-05-20 NOTE — ED Provider Notes (Signed)
CSN: [id number]     Arrival date & time 05/20/16  0757 History   First MD Initiated Contact with Patient 05/20/16 [phone]     Chief Complaint  Patient presents with  . Near Syncope     (Consider location/radiation/quality/duration/timing/severity/associated sxs/prior Treatment) Patient is a 60 y.o. female presenting with near-syncope. The history is provided by the patient.  Near Syncope This is a new problem. The current episode started less than 1 hour ago. The problem occurs constantly. The problem has not changed since onset.Pertinent negatives include no abdominal pain and no shortness of breath. Nothing aggravates the symptoms. Nothing relieves the symptoms. She has tried nothing for the symptoms.    Past Medical History  Diagnosis Date  . Hypertension   . Hyperlipidemia    Past Surgical History  Procedure Laterality Date  . Colonoscopy     Family History  Problem Relation Age of Onset  . Stroke Mother   . Hyperlipidemia Mother   . Diabetes Mother    Social History  Substance Use Topics  . Smoking status: Never Smoker   . Smokeless tobacco: Never Used  . Alcohol Use: No   OB History    Gravida Para Term Preterm AB TAB SAB Ectopic Multiple Living   2 1   1           Review of Systems  Respiratory: Negative for shortness of breath.   Cardiovascular: Positive for near-syncope.  Gastrointestinal: Negative for abdominal pain.  All other systems reviewed and are negative.     Allergies  Isovue  Home Medications   Prior to Admission medications   Medication Sig Start Date End Date Taking? Authorizing Provider  amLODipine (NORVASC) 10 MG tablet Take 1 tablet (10 mg total) by mouth daily. Patient not taking: Reported on 05/15/2016 01/26/16   Gloriann Loan, PA-C  aspirin EC 81 MG tablet Take 1 tablet (81 mg total) by mouth daily. Patient not taking: Reported on 05/15/2016 03/04/13   Silverio Decamp, MD  atorvastatin (LIPITOR) 40 MG tablet TAKE ONE TABLET BY MOUTH  ONCE DAILY Patient not taking: Reported on 05/15/2016 09/21/14   Silverio Decamp, MD  HYDROcodone-acetaminophen (NORCO/VICODIN) 5-325 MG tablet Take 1 tablet by mouth every 4 (four) hours as needed for moderate pain. Patient not taking: Reported on 05/15/2016 05/12/16   Jola Schmidt, MD  hydrOXYzine (ATARAX/VISTARIL) 50 MG tablet Take 1 tablet (50 mg total) by mouth at bedtime and may repeat dose one time if needed. Patient not taking: Reported on 05/15/2016 03/07/16   Silverio Decamp, MD  lisinopril-hydrochlorothiazide (PRINZIDE,ZESTORETIC) 20-25 MG tablet Take 1 tablet by mouth daily. Patient not taking: Reported on 02/20/2016 01/26/16   Gloriann Loan, PA-C  ondansetron (ZOFRAN ODT) 8 MG disintegrating tablet Take 1 tablet (8 mg total) by mouth every 8 (eight) hours as needed for nausea or vomiting. Patient not taking: Reported on 05/15/2016 05/12/16   Jola Schmidt, MD  pantoprazole (PROTONIX) 40 MG tablet Take 1 tablet (40 mg total) by mouth 2 (two) times daily. Patient not taking: Reported on 05/15/2016 02/02/16   Charlynne Cousins, MD  potassium chloride SA (K-DUR,KLOR-CON) 20 MEQ tablet Take 2 tablets (40 mEq total) by mouth daily. Patient not taking: Reported on 05/15/2016 02/16/16   Silverio Decamp, MD  promethazine (PHENERGAN) 25 MG suppository Place 1 suppository (25 mg total) rectally every 6 (six) hours as needed for nausea or vomiting. 05/15/16   Silverio Decamp, MD  promethazine (PHENERGAN) 25 MG tablet Take 1 tablet (  25 mg total) by mouth every 6 (six) hours as needed for nausea or vomiting. 05/12/16   Jola Schmidt, MD   BP 133/79 mmHg  Pulse 71  Temp(Src) 97.7 F (36.5 C)  Resp 20  Ht 5\' 4"  (1.626 m)  Wt 128 lb (58.06 kg)  BMI 21.96 kg/m2  SpO2 100% Physical Exam  Constitutional: She is oriented to person, place, and time. She appears well-developed and well-nourished. She appears listless. She appears ill. No distress.  HENT:  Head: Normocephalic.  Eyes:  Conjunctivae are normal.  Neck: Neck supple. No tracheal deviation present.  Cardiovascular: Normal rate and regular rhythm.   Pulmonary/Chest: Effort normal. No respiratory distress.  Abdominal: Soft. She exhibits no distension.  Neurological: She is oriented to person, place, and time. She appears listless. No cranial nerve deficit. GCS eye subscore is 4. GCS verbal subscore is 5. GCS motor subscore is 6.  Skin: Skin is warm and dry.  Psychiatric: Her speech is normal. She is slowed. She exhibits a depressed mood.    ED Course  Procedures (including critical care time)  Emergency Focused Ultrasound Exam Limited Ultrasound of the Heart and Pericardium  Performed and interpreted by Dr. Laneta Simmers Indication: hypotension Multiple views of the heart, pericardium, and IVC are obtained with a multi frequency probe.  Findings: nml contractility, no anechoic fluid, >50% IVC collapse Interpretation: nml ejection fraction, no pericardial effusion, depressed CVP Images archived electronically.  CPT Code: [id number]   Labs Review Labs Reviewed  CBC WITH DIFFERENTIAL/PLATELET - Abnormal; Notable for the following:    Hemoglobin 10.6 (*)    HCT 31.5 (*)    MCV 66.9 (*)    MCH 22.5 (*)    All other components within normal limits  COMPREHENSIVE METABOLIC PANEL - Abnormal; Notable for the following:    Sodium 134 (*)    Potassium 2.4 (*)    Chloride 96 (*)    CO2 20 (*)    Creatinine, Ser 1.50 (*)    ALT 7 (*)    GFR calc non Af Amer 36 (*)    GFR calc Af Amer 42 (*)    Anion gap 18 (*)    All other components within normal limits  URINALYSIS, ROUTINE W REFLEX MICROSCOPIC (NOT AT [facility]) - Abnormal; Notable for the following:    APPearance HAZY (*)    Bilirubin Urine MODERATE (*)    Ketones, ur >80 (*)    Leukocytes, UA LARGE (*)    All other components within normal limits  URINE MICROSCOPIC-ADD ON - Abnormal; Notable for the following:    Casts HYALINE CASTS (*)    All other components  within normal limits  I-STAT CHEM 8, ED - Abnormal; Notable for the following:    Potassium 2.5 (*)    Chloride 96 (*)    BUN 4 (*)    Creatinine, Ser 1.10 (*)    All other components within normal limits  TROPONIN I  MAGNESIUM  VITAMIN B12  IRON AND TIBC  FERRITIN  FOLATE  RETICULOCYTES  POCT CBG (FASTING - GLUCOSE)-MANUAL ENTRY  CBG MONITORING, ED  TYPE AND SCREEN  ABO/RH    Imaging Review Nm Hepatobiliary Liver Func  05/20/2016  CLINICAL DATA:  60 year old female with nausea vomiting and right upper quadrant pain. Abnormal right upper quadrant ultrasound on 05/12/2016 demonstrating possible gallstone and sludge, but no strong evidence of acute cholecystitis at that time. Initial encounter. EXAM: NUCLEAR MEDICINE HEPATOBILIARY IMAGING TECHNIQUE: Sequential images of the abdomen were obtained  out to 60 minutes following intravenous administration of radiopharmaceutical. RADIOPHARMACEUTICALS:  6.4 mCi Tc-27m  Choletec IV COMPARISON:  Ultrasound and CT Abdomen and Pelvis 05/12/2016 FINDINGS: Prompt radiotracer uptake by the liver and clearance of the blood pool. Prompt CBD activity by 20 minutes. Small bowel activity by 25-30 minutes which increases over the first hour of the study. No gallbladder activity occurred at 1 hour. The study then was augmented with 2.3 mg IV morphine and additional imaging performed. There is gallbladder activity on the first set of the additional images, which increases to the completion of the exam. IMPRESSION: 1. Patent cystic duct, but gallbladder activity visualized only after augmentation with morphine suggesting chronic cholecystitis. 2. Patent CBD. Electronically Signed   By: Genevie Ann M.D.   On: 05/20/2016 16:51   I have personally reviewed and evaluated these images and lab results as part of my medical decision-making.   EKG Interpretation   Date/Time:  Monday May 20 2016 08:02:06 EDT Ventricular Rate:  83 PR Interval:  54 QRS Duration: 87 QT  Interval:  374 QTC Calculation: 439 R Axis:   88 Text Interpretation:  Sinus rhythm Short PR interval Borderline right axis  deviation Abnormal T, consider ischemia, diffuse leads New since previous  tracing Confirmed by Alixandra Alfieri MD, Pati Thinnes ([id number]) on 05/20/2016 8:04:18 AM      MDM   Final diagnoses:  Near syncope  Acute hypokalemia  Other specified hypotension    60 y.o. female presents with Near syncopal episode that occurred while awaiting a HIDA scan this morning as an outpatient workup of cholelithiasis. The patient is extremely weak on arrival and has stated this is an ongoing problem with progressive worsening over months. Her initial appearance is with hypotension, volume depletion on bedside scan without signs of right heart strain, pericardial effusion, or significant cardiac wall motion abnormalities. She does have diffuse ST depressions are likely secondary to demand in the setting of volume depletion. Provided aspirin for coverage of NSTEMI but no active chest pain at any point. Given multiple high risk features to her syncopal episode, will admit for observation, monitoring, and further workup with correction of K.    Leo Grosser, MD 05/20/16 [phone]

## 2016-05-20 NOTE — ED Notes (Signed)
Pt reported pain in LUE with potassium running.  Rate changed from 137mL/hr to 50 mL/hr.  NS running at 164mL/hr.  Pt denies pain in arm at this time.

## 2016-05-20 NOTE — ED Notes (Signed)
Pt arrives from [facility].  Pt's son reports pt became unresponsive but stayed in sitting position. P was at [facility] for scan for gall stones, procedure had not begun.  Pt appears lethargic, initial BP 91/65. AOx4

## 2016-05-20 NOTE — ED Notes (Signed)
Critical Lab Value:  2.4 potassium Dr. Laneta Simmers made aware.

## 2016-05-20 NOTE — ED Notes (Signed)
NP at bedside.

## 2016-05-20 NOTE — H&P (Signed)
[facility] History and Physical  [name] [id number] DOB: 1956-06-03 DOA: 05/20/2016  Referring physician: Laneta Simmers PCP: Aundria Mems, MD   Chief Complaint: near syncope  HPI: [name] is a 60 y.o. female with past medical history that includes hypertension, hyperlipidemia presents to the emergency department with the chief complaint  generalized weakness/abdominal pain/near-syncope. Initial evaluation reveals hypotension, hypokalemia, acute kidney injury and lab work consistent with dehydration  Information is obtained from the patient and her son who is at the bedside. She reports persistent right upper quadrant pain since February of this year. Today she was scheduled for an outpatient HIDA scan and while in the waiting room she developed worsening generalized weakness, lightheadedness and nausea. Her son says she remained sitting in the chairback became somewhat unresponsive. Associated symptoms include persistent nausea and vomiting. Inability to maintain hydration and nutrition, unintentional weight loss. Patient complains of "no energy". She denies chest pain palpitation shortness of breath headache dizziness. She denies dysuria hematuria frequency or urgency. He denies constipation diarrhea bright red blood per rectum or melena. She denies lower extremity edema or orthopnea.  In the emergency department patient is mildly hypertensive with a systolic blood pressure in the 90s she is afebrile she is not hypoxic.  She is provided with 1 L of normal saline at the time of admission her blood pressure is 138/89.  Review of Systems:  10 point review of systems complete and all systems are negative except as indicated in the history of present illness Past Medical History  Diagnosis Date  . Hypertension   . Hyperlipidemia    Past Surgical History  Procedure Laterality Date  . Colonoscopy     Social History:  reports that she has never smoked. She has never used  smokeless tobacco. She reports that she does not drink alcohol or use illicit drugs. He lives at home with her son. She ambulates independently. No recent falls Allergies  Allergen Reactions  . Isovue [Iopamidol] Hives    Pt broke out in several facial hives, one on her back.  She will need full premeds in the future.  J Bohm No allergy demonstrated to  Orally ingested Iodinated agent. (Gastrographin CT Scan 05-12-16) Pt broke out in several facial hives, one on her back.  She will need full premeds in the future.  J Bohm    Family History  Problem Relation Age of Onset  . Stroke Mother   . Hyperlipidemia Mother   . Diabetes Mother     Prior to Admission medications   Medication Sig Start Date End Date Taking? Authorizing Provider  amLODipine (NORVASC) 10 MG tablet Take 1 tablet (10 mg total) by mouth daily. 01/26/16  Yes Gloriann Loan, PA-C  aspirin EC 81 MG tablet Take 1 tablet (81 mg total) by mouth daily. 03/04/13  Yes Silverio Decamp, MD  atorvastatin (LIPITOR) 40 MG tablet TAKE ONE TABLET BY MOUTH ONCE DAILY 09/21/14  Yes Silverio Decamp, MD  HYDROcodone-acetaminophen (NORCO/VICODIN) 5-325 MG tablet Take 1 tablet by mouth every 4 (four) hours as needed for moderate pain. 05/12/16  Yes Jola Schmidt, MD  hydrOXYzine (ATARAX/VISTARIL) 50 MG tablet Take 1 tablet (50 mg total) by mouth at bedtime and may repeat dose one time if needed. 03/07/16  Yes Silverio Decamp, MD  lisinopril-hydrochlorothiazide (PRINZIDE,ZESTORETIC) 20-25 MG tablet Take 1 tablet by mouth daily. 01/26/16  Yes Kayla Rose, PA-C  ondansetron (ZOFRAN ODT) 8 MG disintegrating tablet Take 1 tablet (8 mg total) by mouth every 8 (  eight) hours as needed for nausea or vomiting. 05/12/16  Yes Jola Schmidt, MD  pantoprazole (PROTONIX) 40 MG tablet Take 1 tablet (40 mg total) by mouth 2 (two) times daily. 02/02/16  Yes Charlynne Cousins, MD  potassium chloride SA (K-DUR,KLOR-CON) 20 MEQ tablet Take 2 tablets (40 mEq total)  by mouth daily. 02/16/16  Yes Silverio Decamp, MD  promethazine (PHENERGAN) 25 MG suppository Place 1 suppository (25 mg total) rectally every 6 (six) hours as needed for nausea or vomiting. 05/15/16  Yes Silverio Decamp, MD   Physical Exam: Filed Vitals:   05/20/16 0900 05/20/16 1000 05/20/16 1030 05/20/16 1100  BP: 131/80 144/91 138/89 133/89  Pulse: 71 75 73 73  Temp:      Resp: 18 23 20 20   Height:      Weight:      SpO2: 100% 100% 100% 99%    Wt Readings from Last 3 Encounters:  05/20/16 58.06 kg (128 lb)  05/15/16 58.287 kg (128 lb 8 oz)  05/12/16 67.132 kg (148 lb)    General:  Appears calm and comfortable Somewhat ill appearing Eyes: PERRL, normal lids, irises & conjunctiva ENT: grossly normal hearing, lips & tongue, dismembered of her mouth are dry but pink Neck: no LAD, masses or thyromegaly Cardiovascular: RRR, no m/r/g. No LE edema.  Respiratory: CTA bilaterally, no w/r/r. Normal respiratory effort. Abdomen: soft, sluggish bowel sounds mild tenderness in upper quadrant no guarding or rebounding Skin: no rash or induration seen on limited exam Musculoskeletal: grossly normal tone BUE/BLE Psychiatric: grossly normal mood and affect, speech fluent and appropriate Neurologic: grossly non-focal. Speech clear facial symmetry           Labs on Admission:  Basic Metabolic Panel:  Recent Labs Lab 05/20/16 0750 05/20/16 0813  NA 134* 137  K 2.4* 2.5*  CL 96* 96*  CO2 20*  --   GLUCOSE 86 82  BUN 6 4*  CREATININE 1.50* 1.10*  CALCIUM 10.0  --   MG 1.7  --    Liver Function Tests:  Recent Labs Lab 05/20/16 0750  AST 25  ALT 7*  ALKPHOS 60  BILITOT 1.2  PROT 7.6  ALBUMIN 3.6   No results for input(s): LIPASE, AMYLASE in the last 168 hours. No results for input(s): AMMONIA in the last 168 hours. CBC:  Recent Labs Lab 05/20/16 0750 05/20/16 0813  WBC 5.8  --   NEUTROABS 2.4  --   HGB 10.6* 12.2  HCT 31.5* 36.0  MCV 66.9*  --   PLT  210  --    Cardiac Enzymes:  Recent Labs Lab 05/20/16 0750  TROPONINI <0.03    BNP (last 3 results) No results for input(s): BNP in the last 8760 hours.  ProBNP (last 3 results) No results for input(s): PROBNP in the last 8760 hours.  CBG:  Recent Labs Lab 05/20/16 0816  GLUCAP 85    Radiological Exams on Admission: No results found.  EKG: Independently reviewed. Sinus rhythm Short PR interval Borderline right axis deviation Abnormal T, consider ischemia, diffuse leads New since previous tracing  Assessment/Plan Principal Problem:   Near syncope Active Problems:   Hypertension   Microcytic anemia   Hyperlipidemia with target LDL less than 100   Abdominal pain   Acute kidney injury ([place])   Hypokalemia   Nausea and vomiting  #1.  Near syncope/generalized weakness in the setting of hypotension in patient with three-month history of persistent abdominal pain decreased oral intake presumably  related to possible biliary colic. Recent abdominal imaging with sludge and gallstones. Blood pressure on the low end of normal on presentation. Initial troponin negative. EKG with diffuse ST changes -Admit to telemetry -Cycle troponin -Serial EKG -Generous IV fluids -Check orthostatics -Clear liquids -Antimanic -Analgesia -Obtain HIDA scan -General surgery consult  #2. Acute kidney injury. Creatinine 1.5 on admission. I clearly related to above. Home medications include lisinopril and hydrochlorothiazide. -We'll hold nephrotoxins -IV fluids as noted above -Monitor urine output -Recheck in the morning -If no improvement consider renal ultrasound  #3. Hypertension. Systolic blood pressure 91 on presentation. Improved on admission after 1 L fluid. Home medications include amlodipine and lisinopril -Monitor blood pressure closely -Continue amlodipine as she is able to tolerate -Hydralazine when necessary -Pain management  4. Abdominal pain/nausea and vomiting. Presumably  related to biliary colic. Recent CT and abdominal ultrasound with sludge and gallstones. Outpatient workup per general surgery. Of note she has required several outpatient IV fluid resuscitation. Family reports patient is on schedule for surgery June 8 -See #1 -We will obtain HIDA -General surgery consult  5. Hypokalemia. Potassium 2.4 in the ED. She received 3 runs of potassium while in the ED. Unable to tolerate oral. Magnesium level I.7. EKG as noted above - 2 more runs potassium -Magnesium provided as well -Recheck in the morning -Monitor on telemetry  #6. Anemia. Microcytic. History of same. Hemoglobin 10.6 on admission. Chart review indicates this is very close to her baseline. No signs symptoms of active bleeding -Monitor  General surgery will jennings  Code Status: full DVT Prophylaxis: Family Communication: son at bedside Disposition Plan: home when ready  Time spent: 6 minutes  [facility] See [employer]

## 2016-05-20 NOTE — Consult Note (Signed)
[facility] Surgery Consult Note  [name] Nov 28, 1956  [id number].    Requesting MD: Dyanne Carrel, NP Chief Complaint/Reason for Consult: cholelithiasis/abdominal pain  HPI:  60 y.o. female with a PMH of HTN, HL, microcytic anemia, and cholelithiasis that presented to [facility] after she had a near-syncopal event in the waiting room where she was supposed to receive an outpatient HIDA scan. Denies a near syncope event like this in the past. Pt reports abdominal pain since Feb 2017 that is intermittent and located in her RUQ/epigastric region. She does not necessarily associate the pain with meals. Pain is sharp and non-radiating. Associated with nausea, vomiting, and anorexia. She has required multiple outpatient IV fluid resuscitations due to dehydration and poor oral intake. She reports 25-30 lbs of weight loss since the beginning of the year. She was diagnosed with gallstones by CT scan on 05/12/16 for which she was receiving outpatient follow-up (HIDA 05/20/16 and outpatient appointment with Greer Pickerel, MD on 05/23/16). Denies CP, SOB, hematemesis, melena, diarrhea. Last BM was 2 days ago, formed and brown.   Pt is followed by a gastroenterologist at [facility] Marland KitchenFeliberto Harts, MD) for non-intractable nausea and vomiting and has also been seen at [facility] by Dr. Benson Norway.Jejunitis found on CT 03/2016, colonscopy with Bx performed and results suspicious for celiac disease, pt sxs did not respond to GF diet but inflammation of jejunum resolved.   Allergic to gastrografin (urticaria) Takes ASA daily, no other blood thinners No PMH of abdominal surgeries   ROS: All systems reviewed and otherwise negative except for as above  Family History  Problem Relation Age of Onset  . Stroke Mother   . Hyperlipidemia Mother   . Diabetes Mother     Past Medical History  Diagnosis Date  . Hypertension   . Hyperlipidemia   . Abdominal pain     persistent    Past Surgical History  Procedure  Laterality Date  . Colonoscopy      Social History:  reports that she has never smoked. She has never used smokeless tobacco. She reports that she does not drink alcohol or use illicit drugs.  Allergies:  Allergies  Allergen Reactions  . Isovue [Iopamidol] Hives    Pt broke out in several facial hives, one on her back.  She will need full premeds in the future.  J Bohm No allergy demonstrated to  Orally ingested Iodinated agent. (Gastrographin CT Scan 05-12-16) Pt broke out in several facial hives, one on her back.  She will need full premeds in the future.  J Bohm    Medications Prior to Admission  Medication Sig Dispense Refill  . amLODipine (NORVASC) 10 MG tablet Take 1 tablet (10 mg total) by mouth daily. 30 tablet 0  . aspirin EC 81 MG tablet Take 1 tablet (81 mg total) by mouth daily. 90 tablet 3  . atorvastatin (LIPITOR) 40 MG tablet TAKE ONE TABLET BY MOUTH ONCE DAILY 90 tablet 0  . HYDROcodone-acetaminophen (NORCO/VICODIN) 5-325 MG tablet Take 1 tablet by mouth every 4 (four) hours as needed for moderate pain. 10 tablet 0  . hydrOXYzine (ATARAX/VISTARIL) 50 MG tablet Take 1 tablet (50 mg total) by mouth at bedtime and may repeat dose one time if needed. 10 tablet 3  . lisinopril-hydrochlorothiazide (PRINZIDE,ZESTORETIC) 20-25 MG tablet Take 1 tablet by mouth daily. 30 tablet 0  . ondansetron (ZOFRAN ODT) 8 MG disintegrating tablet Take 1 tablet (8 mg total) by mouth every 8 (eight) hours as needed for nausea  or vomiting. 12 tablet 0  . pantoprazole (PROTONIX) 40 MG tablet Take 1 tablet (40 mg total) by mouth 2 (two) times daily. 60 tablet 3  . potassium chloride SA (K-DUR,KLOR-CON) 20 MEQ tablet Take 2 tablets (40 mEq total) by mouth daily. 10 tablet 0  . promethazine (PHENERGAN) 25 MG suppository Place 1 suppository (25 mg total) rectally every 6 (six) hours as needed for nausea or vomiting. 12 each 0    Blood pressure 133/89, pulse 73, temperature 97.7 F (36.5 C), resp. rate  20, height _0  (1.626 m), weight 58.06 kg (128 lb), SpO2 99 %. Physical Exam: General: pleasant, AA female who is laying in bed in mild distress, had one episode of clear emesis in the room during my exam. HEENT: head is normocephalic, atraumatic.  Sclera are noninjected.  Lips are dry. Oral mucosa is pink. Heart: regular, rate, and rhythm.  No obvious murmurs, gallops, or rubs noted.   Lungs: CTAB,  Respiratory effort nonlabored Abd: soft, mildly TTP of RUQ, epigastrum, and LLQ, ND, +BS, no masses, hernias, or organomegaly. Negative Murphy's. No peritoneal signs. MS: all 4 extremities are symmetrical with no cyanosis, clubbing, or edema. Psych: A&Ox3 with an appropriate affect.  Results for orders placed or performed during the hospital encounter of 05/20/16 (from the past 48 hour(s))  Troponin I     Status: None   Collection Time: 05/20/16  7:50 AM  Result Value Ref Range   Troponin I <0.03 <0.031 ng/mL    Comment:        NO INDICATION OF MYOCARDIAL INJURY.   CBC WITH DIFFERENTIAL     Status: Abnormal   Collection Time: 05/20/16  7:50 AM  Result Value Ref Range   WBC 5.8 4.0 - 10.5 K/uL   RBC 4.71 3.87 - 5.11 MIL/uL   Hemoglobin 10.6 (L) 12.0 - 15.0 g/dL   HCT 31.5 (L) 36.0 - 46.0 %   MCV 66.9 (L) 78.0 - 100.0 fL   MCH 22.5 (L) 26.0 - 34.0 pg   MCHC 33.7 30.0 - 36.0 g/dL   RDW 15.5 11.5 - 15.5 %   Platelets 210 150 - 400 K/uL   Neutrophils Relative % 42 %   Lymphocytes Relative 48 %   Monocytes Relative 8 %   Eosinophils Relative 2 %   Basophils Relative 0 %   Neutro Abs 2.4 1.7 - 7.7 K/uL   Lymphs Abs 2.8 0.7 - 4.0 K/uL   Monocytes Absolute 0.5 0.1 - 1.0 K/uL   Eosinophils Absolute 0.1 0.0 - 0.7 K/uL   Basophils Absolute 0.0 0.0 - 0.1 K/uL   Smear Review MORPHOLOGY UNREMARKABLE   Comprehensive metabolic panel     Status: Abnormal   Collection Time: 05/20/16  7:50 AM  Result Value Ref Range   Sodium 134 (L) 135 - 145 mmol/L   Potassium 2.4 (LL) 3.5 - 5.1 mmol/L     Comment: CRITICAL RESULT CALLED TO, READ BACK BY AND VERIFIED WITH: C.KING,RN [id number] 05/20/16 CLARK,S    Chloride 96 (L) 101 - 111 mmol/L   CO2 20 (L) 22 - 32 mmol/L   Glucose, Bld 86 65 - 99 mg/dL   BUN 6 6 - 20 mg/dL   Creatinine, Ser 1.50 (H) 0.44 - 1.00 mg/dL   Calcium 10.0 8.9 - 10.3 mg/dL   Total Protein 7.6 6.5 - 8.1 g/dL   Albumin 3.6 3.5 - 5.0 g/dL   AST 25 15 - 41 U/L   ALT 7 (L) 14 -  54 U/L   Alkaline Phosphatase 60 38 - 126 U/L   Total Bilirubin 1.2 0.3 - 1.2 mg/dL   GFR calc non Af Amer 36 (L) >60 mL/min   GFR calc Af Amer 42 (L) >60 mL/min    Comment: (NOTE) The eGFR has been calculated using the CKD EPI equation. This calculation has not been validated in all clinical situations. eGFR's persistently <60 mL/min signify possible Chronic Kidney Disease.    Anion gap 18 (H) 5 - 15  Magnesium     Status: None   Collection Time: 05/20/16  7:50 AM  Result Value Ref Range   Magnesium 1.7 1.7 - 2.4 mg/dL  I-stat chem 8, ED  (not at [facility], [facility])     Status: Abnormal   Collection Time: 05/20/16  8:13 AM  Result Value Ref Range   Sodium 137 135 - 145 mmol/L   Potassium 2.5 (LL) 3.5 - 5.1 mmol/L   Chloride 96 (L) 101 - 111 mmol/L   BUN 4 (L) 6 - 20 mg/dL   Creatinine, Ser 1.10 (H) 0.44 - 1.00 mg/dL   Glucose, Bld 82 65 - 99 mg/dL   Calcium, Ion 1.20 1.13 - 1.30 mmol/L   TCO2 22 0 - 100 mmol/L   Hemoglobin 12.2 12.0 - 15.0 g/dL   HCT 36.0 36.0 - 46.0 %  CBG monitoring, ED     Status: None   Collection Time: 05/20/16  8:16 AM  Result Value Ref Range   Glucose-Capillary 85 65 - 99 mg/dL  Type and screen [facility]     Status: None   Collection Time: 05/20/16  8:29 AM  Result Value Ref Range   ABO/RH(D) O POS    Antibody Screen NEG    Sample Expiration 05/23/2016   Urinalysis, Routine w reflex microscopic (not at [facility])     Status: Abnormal   Collection Time: 05/20/16  9:40 AM  Result Value Ref Range   Color, Urine YELLOW YELLOW   APPearance HAZY (A)  CLEAR   Specific Gravity, Urine 1.011 1.005 - 1.030   pH 6.0 5.0 - 8.0   Glucose, UA NEGATIVE NEGATIVE mg/dL   Hgb urine dipstick NEGATIVE NEGATIVE   Bilirubin Urine MODERATE (A) NEGATIVE   Ketones, ur >80 (A) NEGATIVE mg/dL   Protein, ur NEGATIVE NEGATIVE mg/dL   Nitrite NEGATIVE NEGATIVE   Leukocytes, UA LARGE (A) NEGATIVE  Urine microscopic-add on     Status: Abnormal   Collection Time: 05/20/16  9:40 AM  Result Value Ref Range   Squamous Epithelial / LPF NONE SEEN NONE SEEN   WBC, UA 6-30 0 - 5 WBC/hpf   RBC / HPF NONE SEEN 0 - 5 RBC/hpf   Bacteria, UA NONE SEEN NONE SEEN   Casts HYALINE CASTS (A) NEGATIVE   No results found.    Assessment/Plan Cholelithiasis with no evidence of acute cholecystitis (CT scan 05/12/16)  Abdominal pain with non-intractable nausea and vomiting - HIDA report pending  - AST 25, ALT 7, Alk Phos 60, Total Bili 1.2, WBC 5.8 - afebrile   Near syncope -Diffuse ST depression on EKG, volume depletion; per medicine weakness Hypokalemia - 2.4 mmol/L, per medicine AKI - likely secondary to dehydration (BUN 4, SCr 1.10), rehydration and d/c nephrotoxic medications per medicine Hypertension - PRN hydralazine, per medicine Microcytic anemia - Hgb 10.8, per medicine Hyperlipidemia  Will follow patient and determine need for surgical intervention pending HIDA results.   Jill Alexanders, [facility] Surgery 05/20/2016,  1:36 PM Pager: [phone] Mon-Fri 7:00 am-4:30 pm Sat-Sun 7:00 am-11:30 am

## 2016-05-20 NOTE — ED Notes (Signed)
MD at bedside. 

## 2016-05-21 ENCOUNTER — Observation Stay (HOSPITAL_COMMUNITY): Payer: 59 | Admitting: Anesthesiology

## 2016-05-21 ENCOUNTER — Encounter (HOSPITAL_COMMUNITY)
Admission: EM | Disposition: A | Discharge status: Home Health | Payer: Self-pay | Source: Home / Self Care | Attending: Family Medicine

## 2016-05-21 DIAGNOSIS — N179 Acute kidney failure, unspecified: Secondary | ICD-10-CM | POA: Diagnosis not present

## 2016-05-21 DIAGNOSIS — E876 Hypokalemia: Secondary | ICD-10-CM | POA: Diagnosis not present

## 2016-05-21 DIAGNOSIS — K811 Chronic cholecystitis: Secondary | ICD-10-CM

## 2016-05-21 DIAGNOSIS — D509 Iron deficiency anemia, unspecified: Secondary | ICD-10-CM

## 2016-05-21 DIAGNOSIS — R55 Syncope and collapse: Secondary | ICD-10-CM | POA: Diagnosis not present

## 2016-05-21 DIAGNOSIS — E785 Hyperlipidemia, unspecified: Secondary | ICD-10-CM

## 2016-05-21 HISTORY — PX: CHOLECYSTECTOMY: SHX55

## 2016-05-21 LAB — CBC
HCT: 30.6 % — ABNORMAL LOW (ref 36.0–46.0)
HEMOGLOBIN: 9.7 g/dL — AB (ref 12.0–15.0)
MCH: 21.8 pg — AB (ref 26.0–34.0)
MCHC: 31.7 g/dL (ref 30.0–36.0)
MCV: 68.8 fL — AB (ref 78.0–100.0)
PLATELETS: 188 10*3/uL (ref 150–400)
RBC: 4.45 MIL/uL (ref 3.87–5.11)
RDW: 15.9 % — ABNORMAL HIGH (ref 11.5–15.5)
WBC: 4.5 10*3/uL (ref 4.0–10.5)

## 2016-05-21 LAB — BASIC METABOLIC PANEL
ANION GAP: 14 (ref 5–15)
Anion gap: 12 (ref 5–15)
CHLORIDE: 102 mmol/L (ref 101–111)
CHLORIDE: 104 mmol/L (ref 101–111)
CO2: 23 mmol/L (ref 22–32)
CO2: 23 mmol/L (ref 22–32)
CREATININE: 1.1 mg/dL — AB (ref 0.44–1.00)
Calcium: 9 mg/dL (ref 8.9–10.3)
Calcium: 8.6 mg/dL — ABNORMAL LOW (ref 8.9–10.3)
Creatinine, Ser: 1.16 mg/dL — ABNORMAL HIGH (ref 0.44–1.00)
GFR calc Af Amer: 57 mL/min — ABNORMAL LOW (ref >60)
GFR calc Af Amer: >60 mL/min (ref >60)
GFR calc non Af Amer: 53 mL/min — ABNORMAL LOW (ref >60)
GFR, EST NON AFRICAN AMERICAN: 49 mL/min — AB (ref >60)
GLUCOSE: 67 mg/dL (ref 65–99)
GLUCOSE: 75 mg/dL (ref 65–99)
POTASSIUM: 2.8 mmol/L — AB (ref 3.5–5.1)
Potassium: 3 mmol/L — ABNORMAL LOW (ref 3.5–5.1)
SODIUM: 139 mmol/L (ref 135–145)
SODIUM: 139 mmol/L (ref 135–145)

## 2016-05-21 SURGERY — LAPAROSCOPIC CHOLECYSTECTOMY
Anesthesia: General | Site: Abdomen

## 2016-05-21 MED ORDER — PROPOFOL 10 MG/ML IV BOLUS
INTRAVENOUS | Status: AC
  Filled 2016-05-21: qty 40

## 2016-05-21 MED ORDER — POTASSIUM CHLORIDE CRYS ER 20 MEQ PO TBCR
40.0000 meq | EXTENDED_RELEASE_TABLET | Freq: Once | ORAL | Status: AC
  Administered 2016-05-21: 40 meq via ORAL
  Filled 2016-05-21: qty 2

## 2016-05-21 MED ORDER — IOPAMIDOL (ISOVUE-300) INJECTION 61%
INTRAVENOUS | Status: AC
  Filled 2016-05-21: qty 50

## 2016-05-21 MED ORDER — SUGAMMADEX SODIUM 200 MG/2ML IV SOLN
INTRAVENOUS | Status: DC | PRN
  Administered 2016-05-21: 122.6 mg via INTRAVENOUS

## 2016-05-21 MED ORDER — LACTATED RINGERS IV SOLN
INTRAVENOUS | Status: DC | PRN
  Administered 2016-05-21 (×2): via INTRAVENOUS

## 2016-05-21 MED ORDER — MIDAZOLAM HCL 2 MG/2ML IJ SOLN
INTRAMUSCULAR | Status: AC
  Filled 2016-05-21: qty 2

## 2016-05-21 MED ORDER — BUPIVACAINE-EPINEPHRINE (PF) 0.25% -1:200000 IJ SOLN
INTRAMUSCULAR | Status: AC
  Filled 2016-05-21: qty 30

## 2016-05-21 MED ORDER — LACTATED RINGERS IV SOLN: INTRAVENOUS | Status: DC

## 2016-05-21 MED ORDER — PHENOL 1.4 % MT LIQD
1.0000 | OROMUCOSAL | Status: DC | PRN
  Administered 2016-05-21: 1 via OROMUCOSAL
  Filled 2016-05-21: qty 177

## 2016-05-21 MED ORDER — LIDOCAINE HCL (CARDIAC) 20 MG/ML IV SOLN
INTRAVENOUS | Status: DC | PRN
  Administered 2016-05-21: 100 mg via INTRAVENOUS

## 2016-05-21 MED ORDER — MIDAZOLAM HCL 5 MG/5ML IJ SOLN
INTRAMUSCULAR | Status: DC | PRN
  Administered 2016-05-21: 1 mg via INTRAVENOUS

## 2016-05-21 MED ORDER — PROPOFOL 10 MG/ML IV BOLUS
INTRAVENOUS | Status: AC
  Filled 2016-05-21: qty 20

## 2016-05-21 MED ORDER — PHENYLEPHRINE HCL 10 MG/ML IJ SOLN
10.0000 mg | INTRAVENOUS | Status: DC | PRN
  Administered 2016-05-21: 20 ug/min via INTRAVENOUS

## 2016-05-21 MED ORDER — PHENYLEPHRINE HCL 10 MG/ML IJ SOLN
INTRAMUSCULAR | Status: DC | PRN
  Administered 2016-05-21: 160 ug via INTRAVENOUS
  Administered 2016-05-21: 80 ug via INTRAVENOUS
  Administered 2016-05-21: 120 ug via INTRAVENOUS

## 2016-05-21 MED ORDER — POTASSIUM CHLORIDE 10 MEQ/100ML IV SOLN
10.0000 meq | INTRAVENOUS | Status: AC
  Administered 2016-05-21 (×2): 10 meq via INTRAVENOUS
  Filled 2016-05-21 (×3): qty 100

## 2016-05-21 MED ORDER — SODIUM CHLORIDE 0.9 % IV SOLN
INTRAVENOUS | Status: AC
  Administered 2016-05-21: 75 mL/h via INTRAVENOUS
  Administered 2016-05-21 – 2016-05-22 (×2): via INTRAVENOUS

## 2016-05-21 MED ORDER — ROCURONIUM BROMIDE 100 MG/10ML IV SOLN
INTRAVENOUS | Status: DC | PRN
  Administered 2016-05-21: 40 mg via INTRAVENOUS

## 2016-05-21 MED ORDER — CEFAZOLIN SODIUM-DEXTROSE 2-4 GM/100ML-% IV SOLN
2.0000 g | INTRAVENOUS | Status: AC
  Administered 2016-05-21: 2 g via INTRAVENOUS
  Filled 2016-05-21: qty 100

## 2016-05-21 MED ORDER — OXYCODONE-ACETAMINOPHEN 5-325 MG PO TABS
1.0000 | ORAL_TABLET | ORAL | Status: DC | PRN
  Administered 2016-05-21 – 2016-05-23 (×3): 2 via ORAL
  Filled 2016-05-21 (×4): qty 2

## 2016-05-21 MED ORDER — ARTIFICIAL TEARS OP OINT
TOPICAL_OINTMENT | OPHTHALMIC | Status: DC | PRN
  Administered 2016-05-21: 1 via OPHTHALMIC

## 2016-05-21 MED ORDER — BUPIVACAINE-EPINEPHRINE 0.25% -1:200000 IJ SOLN
INTRAMUSCULAR | Status: DC | PRN
  Administered 2016-05-21: 8 mL

## 2016-05-21 MED ORDER — PANTOPRAZOLE SODIUM 40 MG PO TBEC
40.0000 mg | DELAYED_RELEASE_TABLET | Freq: Every day | ORAL | Status: DC
  Administered 2016-05-22 – 2016-05-30 (×9): 40 mg via ORAL
  Filled 2016-05-21 (×10): qty 1

## 2016-05-21 MED ORDER — 0.9 % SODIUM CHLORIDE (POUR BTL) OPTIME
TOPICAL | Status: DC | PRN
  Administered 2016-05-21: 1000 mL

## 2016-05-21 MED ORDER — SODIUM CHLORIDE 0.9 % IR SOLN
Status: DC | PRN
  Administered 2016-05-21: 1000 mL

## 2016-05-21 MED ORDER — FENTANYL CITRATE (PF) 100 MCG/2ML IJ SOLN
INTRAMUSCULAR | Status: DC | PRN
  Administered 2016-05-21: 150 ug via INTRAVENOUS
  Administered 2016-05-21: 50 ug via INTRAVENOUS

## 2016-05-21 MED ORDER — ALBUMIN HUMAN 5 % IV SOLN
INTRAVENOUS | Status: DC | PRN
  Administered 2016-05-21: 13:00:00 via INTRAVENOUS

## 2016-05-21 MED ORDER — FENTANYL CITRATE (PF) 250 MCG/5ML IJ SOLN
INTRAMUSCULAR | Status: AC
  Filled 2016-05-21: qty 5

## 2016-05-21 MED ORDER — POTASSIUM CHLORIDE 10 MEQ/100ML IV SOLN
10.0000 meq | INTRAVENOUS | Status: AC
  Administered 2016-05-21 (×2): 10 meq via INTRAVENOUS
  Filled 2016-05-21 (×2): qty 100

## 2016-05-21 MED ORDER — ONDANSETRON HCL 4 MG/2ML IJ SOLN: 4.0000 mg | Freq: Once | INTRAMUSCULAR | Status: DC | PRN

## 2016-05-21 MED ORDER — PROPOFOL 10 MG/ML IV BOLUS
INTRAVENOUS | Status: DC | PRN
  Administered 2016-05-21: 100 mg via INTRAVENOUS

## 2016-05-21 MED ORDER — ONDANSETRON HCL 4 MG/2ML IJ SOLN
INTRAMUSCULAR | Status: DC | PRN
  Administered 2016-05-21: 4 mg via INTRAVENOUS

## 2016-05-21 MED ORDER — FENTANYL CITRATE (PF) 100 MCG/2ML IJ SOLN
25.0000 ug | INTRAMUSCULAR | Status: DC | PRN
Start: 2016-05-21 — End: 2016-05-21

## 2016-05-21 MED ORDER — SUCCINYLCHOLINE CHLORIDE 20 MG/ML IJ SOLN
INTRAMUSCULAR | Status: DC | PRN
  Administered 2016-05-21: 100 mg via INTRAVENOUS

## 2016-05-21 SURGICAL SUPPLY — 45 items
APPLIER CLIP ROT 10 11.4 M/L (STAPLE) ×4
APR CLP MED LRG 11.4X10 (STAPLE) ×2
BAG SPEC RTRVL LRG 6X4 10 (ENDOMECHANICALS) ×2
BLADE SURG ROTATE 9660 (MISCELLANEOUS) IMPLANT
CANISTER SUCTION 2500CC (MISCELLANEOUS) ×4 IMPLANT
CHLORAPREP W/TINT 26ML (MISCELLANEOUS) ×4 IMPLANT
CLIP APPLIE ROT 10 11.4 M/L (STAPLE) ×2 IMPLANT
COVER MAYO STAND STRL (DRAPES) ×1 IMPLANT
COVER SURGICAL LIGHT HANDLE (MISCELLANEOUS) ×4 IMPLANT
DRAPE C-ARM 42X72 X-RAY (DRAPES) ×1 IMPLANT
DRAPE WARM FLUID 44X44 (DRAPE) ×4 IMPLANT
ELECT REM PT RETURN 9FT ADLT (ELECTROSURGICAL) ×4
ELECTRODE REM PT RTRN 9FT ADLT (ELECTROSURGICAL) ×2 IMPLANT
GLOVE BIO SURGEON STRL SZ7 (GLOVE) ×6 IMPLANT
GLOVE BIO SURGEON STRL SZ8 (GLOVE) ×4 IMPLANT
GLOVE BIOGEL PI IND STRL 7.0 (GLOVE) ×2 IMPLANT
GLOVE BIOGEL PI IND STRL 7.5 (GLOVE) ×1 IMPLANT
GLOVE BIOGEL PI IND STRL 8 (GLOVE) ×2 IMPLANT
GLOVE BIOGEL PI INDICATOR 7.0 (GLOVE) ×4
GLOVE BIOGEL PI INDICATOR 7.5 (GLOVE) ×2
GLOVE BIOGEL PI INDICATOR 8 (GLOVE) ×2
GLOVE ECLIPSE 7.0 STRL STRAW (GLOVE) ×3 IMPLANT
GOWN STRL REUS W/ TWL LRG LVL3 (GOWN DISPOSABLE) ×5 IMPLANT
GOWN STRL REUS W/ TWL XL LVL3 (GOWN DISPOSABLE) ×2 IMPLANT
GOWN STRL REUS W/TWL LRG LVL3 (GOWN DISPOSABLE) ×12
GOWN STRL REUS W/TWL XL LVL3 (GOWN DISPOSABLE) ×4
KIT BASIN OR (CUSTOM PROCEDURE TRAY) ×4 IMPLANT
KIT ROOM TURNOVER OR (KITS) ×4 IMPLANT
LIQUID BAND (GAUZE/BANDAGES/DRESSINGS) ×4 IMPLANT
NS IRRIG 1000ML POUR BTL (IV SOLUTION) ×4 IMPLANT
PAD ARMBOARD 7.5X6 YLW CONV (MISCELLANEOUS) ×4 IMPLANT
POUCH SPECIMEN RETRIEVAL 10MM (ENDOMECHANICALS) ×4 IMPLANT
SCISSORS LAP 5X35 DISP (ENDOMECHANICALS) ×4 IMPLANT
SET CHOLANGIOGRAPH 5 50 .035 (SET/KITS/TRAYS/PACK) ×1 IMPLANT
SET IRRIG TUBING LAPAROSCOPIC (IRRIGATION / IRRIGATOR) ×4 IMPLANT
SLEEVE ENDOPATH XCEL 5M (ENDOMECHANICALS) ×4 IMPLANT
SPECIMEN JAR SMALL (MISCELLANEOUS) ×4 IMPLANT
SUT MNCRL AB 4-0 PS2 18 (SUTURE) ×4 IMPLANT
TOWEL OR 17X24 6PK STRL BLUE (TOWEL DISPOSABLE) ×1 IMPLANT
TOWEL OR 17X26 10 PK STRL BLUE (TOWEL DISPOSABLE) ×4 IMPLANT
TRAY LAPAROSCOPIC MC (CUSTOM PROCEDURE TRAY) ×4 IMPLANT
TROCAR XCEL BLUNT TIP 100MML (ENDOMECHANICALS) ×4 IMPLANT
TROCAR XCEL NON-BLD 11X100MML (ENDOMECHANICALS) ×4 IMPLANT
TROCAR XCEL NON-BLD 5MMX100MML (ENDOMECHANICALS) ×4 IMPLANT
TUBING INSUFFLATION (TUBING) ×4 IMPLANT

## 2016-05-21 NOTE — Transfer of Care (Signed)
Immediate Anesthesia Transfer of Care Note  Patient: [name]  Procedure(s) Performed: Procedure(s): LAPAROSCOPIC CHOLECYSTECTOMY (N/A)  Patient Location: PACU  Anesthesia Type:General  Level of Consciousness: awake, alert  and oriented  Airway & Oxygen Therapy: Patient Spontanous Breathing and Patient connected to nasal cannula oxygen  Post-op Assessment: Report given to RN and Post -op Vital signs reviewed and stable  Post vital signs: Reviewed and stable  Last Vitals:  Filed Vitals:   05/21/16 0457 05/21/16 0843  BP: 127/81 117/79  Pulse: 88 80  Temp: 36.8 C 36.9 C  Resp: 18 17    Last Pain:  Filed Vitals:   05/21/16 1119  PainSc: 0-No pain         Complications: No apparent anesthesia complications

## 2016-05-21 NOTE — Progress Notes (Addendum)
Initial Nutrition Assessment  DOCUMENTATION CODES:   Not applicable  INTERVENTION:  Once diet advances, continue Boost Breeze po TID, each supplement provides 250 kcal and 9 grams of protein.  NUTRITION DIAGNOSIS:   Inadequate oral intake related to poor appetite as evidenced by per patient/family report.  GOAL:   Patient will meet greater than or equal to 90% of their needs  MONITOR:   Diet advancement, Weight trends, Labs, I & O's, Skin  REASON FOR ASSESSMENT:   Malnutrition Screening Tool    ASSESSMENT:   60 y.o. female with a Past Medical History of HTN, HLD who presents with near syncopal episode likely secondary to irritation, orthostasis/vasovagal episode in the setting of abdominal pain from underlying cholelithiasis and hypokalemia, AKI likely from dehydration. Pt diagnosed with gallstones by CT scan on 05/12/16.  Pt in procedure during attempted time of visit. Pt undergoing laparoscopic cholecystectomy. RD unable to obtain nutrition history. Per surgery MD note, pt with poor po intake with an associated weight loss of 25-30 lbs since the beginning of the year. Per Epic weight records, pt with a 21% weight loss in 4 months. Pt currently has Boost Breeze ordered. Nursing staff to provide once diet advances. Unable to complete Nutrition-Focused physical exam at this time. RD to revisit.  Labs and medications reviewed.   Diet Order:  Diet NPO time specified  Skin:   (Incision on abdomen)  Last BM:  6/3  Height:   Ht Readings from Last 1 Encounters:  05/20/16 5\' 4"  (1.626 m)    Weight:   Wt Readings from Last 1 Encounters:  05/20/16 135 lb 1.6 oz (61.281 kg)    Ideal Body Weight:  54.5 kg  BMI:  Body mass index is 23.18 kg/(m^2).  Estimated Nutritional Needs:   Kcal:  1700-1850  Protein:  75-85 grams  Fluid:  1.7 - 1.9 L/day  EDUCATION NEEDS:   No education needs identified at this time  Corrin Parker, MS, RD, LDN Pager # [phone] After  hours/ weekend pager # [phone]

## 2016-05-21 NOTE — Progress Notes (Signed)
[facility] Progress Note     Subjective: Sitting up in bed, still looks uncomfortable. Reports 6-7 episodes of clear, small-volume emesis yesterday and severe nausea, only temporarily relieved by pain meds. Abdominal pain is centrally located at 5/10 in intensity. Urinating without hesitancy. Denies BM in 3-4 days and requests an enema. Denies CP, palpitations, SOB.   Spoke to patient and her son, Joneen Boers, whom she called over the phone to listen in to our conversation, at length about gallbladder function and the laparoscopic cholecystectomy. Drew picture of biliary tree and explained biliary colic. Patient agreeable to surgery. Denies complications with anesthesia in the past during colonoscopy. Again, no PMH of abdominal surgeries.   Objective: Vital signs in last 24 hours: Temp:  [97.7 F (36.5 C)-98.6 F (37 C)] 98.3 F (36.8 C) (06/06 0457) Pulse Rate:  [71-88] 88 (06/06 0457) Resp:  [17-25] 18 (06/06 0457) BP: (91-144)/(65-91) 127/81 mmHg (06/06 0457) SpO2:  [99 %-100 %] 99 % (06/06 0457) Weight:  [58.06 kg (128 lb)-61.281 kg (135 lb 1.6 oz)] 61.281 kg (135 lb 1.6 oz) (06/05 2102) Last BM Date: 05/18/16  Intake/Output from previous day: 06/05 0701 - 06/06 0700 In: 2079.2 [P.O.:600; I.V.:1479.2] Out: 0  Intake/Output this shift:    PE: Gen:  Alert, NAD, pleasant Card:  RRR, no M/G/R heard Pulm:  CTA, no W/R/R Abd: Soft, TTP, ND, +BS, no HSM Ext:  No erythema, edema, or tenderness   Lab Results:   Recent Labs  05/20/16 0750 05/20/16 0813  WBC 5.8  --   HGB 10.6* 12.2  HCT 31.5* 36.0  PLT 210  --    BMET  Recent Labs  05/20/16 0750 05/20/16 0813  NA 134* 137  K 2.4* 2.5*  CL 96* 96*  CO2 20*  --   GLUCOSE 86 82  BUN 6 4*  CREATININE 1.50* 1.10*  CALCIUM 10.0  --    PT/INR No results for input(s): LABPROT, INR in the last 72 hours. CMP     Component Value Date/Time   NA 137 05/20/2016 0813   K 2.5* 05/20/2016 0813   CL 96*  05/20/2016 0813   CO2 20* 05/20/2016 0750   GLUCOSE 82 05/20/2016 0813   BUN 4* 05/20/2016 0813   CREATININE 1.10* 05/20/2016 0813   CREATININE 1.18* 02/26/2016 1157   CALCIUM 10.0 05/20/2016 0750   PROT 7.6 05/20/2016 0750   ALBUMIN 3.6 05/20/2016 0750   AST 25 05/20/2016 0750   ALT 7* 05/20/2016 0750   ALKPHOS 60 05/20/2016 0750   BILITOT 1.2 05/20/2016 0750   GFRNONAA 36* 05/20/2016 0750   GFRAA 42* 05/20/2016 0750   Lipase     Component Value Date/Time   LIPASE 23 05/12/2016 0810       Studies/Results: Nm Hepatobiliary Liver Func  05/20/2016  CLINICAL DATA:  60 year old female with nausea vomiting and right upper quadrant pain. Abnormal right upper quadrant ultrasound on 05/12/2016 demonstrating possible gallstone and sludge, but no strong evidence of acute cholecystitis at that time. Initial encounter. EXAM: NUCLEAR MEDICINE HEPATOBILIARY IMAGING TECHNIQUE: Sequential images of the abdomen were obtained out to 60 minutes following intravenous administration of radiopharmaceutical. RADIOPHARMACEUTICALS:  6.4 mCi Tc-61m  Choletec IV COMPARISON:  Ultrasound and CT Abdomen and Pelvis 05/12/2016 FINDINGS: Prompt radiotracer uptake by the liver and clearance of the blood pool. Prompt CBD activity by 20 minutes. Small bowel activity by 25-30 minutes which increases over the first hour of the study. No gallbladder activity occurred at 1 hour. The study  then was augmented with 2.3 mg IV morphine and additional imaging performed. There is gallbladder activity on the first set of the additional images, which increases to the completion of the exam. IMPRESSION: 1. Patent cystic duct, but gallbladder activity visualized only after augmentation with morphine suggesting chronic cholecystitis. 2. Patent CBD. Electronically Signed   By: Genevie Ann M.D.   On: 05/20/2016 16:51    Anti-infectives: Anti-infectives    None       Assessment/Plan Cholelithiasis - lap chole today with Dr. Brantley Stage  -  NPO, OR consent ordered  - one-time dose prophylactic antibiotics ordered  - risks discussed with pt including damage to surrounding structures, blood loss, conversion to open, respiratory compromise, and death.  Near syncope - suspect secondary vasovagal/orthostasis due to abd pain and dehydration; per medicine weakness Hypokalemia - 2.4 mmol/L 6/5, 6/6 BMP pendng; per medicine AKI - likely secondary to dehydration (BUN 4, SCr 1.10), rehydration and d/c nephrotoxic medications per medicine Hypertension - PRN hydralazine, per medicine Microcytic anemia - Hgb 10.8, per medicine Hyperlipidemia  Jill Alexanders , [facility] Surgery 05/21/2016, 7:34 AM Pager: [phone] Mon-Fri 7:00 am-4:30 pm Sat-Sun 7:00 am-11:30 am

## 2016-05-21 NOTE — Op Note (Signed)
Laparoscopic Cholecystectomy Procedure Note  Indications: This patient presents with symptomatic gallbladder disease and will undergo laparoscopic cholecystectomy. The procedure has been discussed with the patient. Operative and non operative treatments have been discussed. Risks of surgery include bleeding, infection,  Common bile duct injury,  Injury to the stomach,liver, colon,small intestine, abdominal wall,  Diaphragm,  Major blood vessels,  And the need for an open procedure.  Other risks include worsening of medical problems, death,  DVT and pulmonary embolism, and cardiovascular events.   Medical options have also been discussed. The patient has been informed of long term expectations of surgery and non surgical options,  The patient agrees to proceed.    Pre-operative Diagnosis: Calculus of gallbladder without mention of cholecystitis or obstruction  Post-operative Diagnosis: Same  Surgeon: Alain Deschene A.   Assistants: Dr Georgette Dover   Anesthesia: General endotracheal anesthesia and Local anesthesia 0.25.% bupivacaine, with epinephrine  ASA Class: 2  Procedure Details  The patient was seen again in the Holding Room. The risks, benefits, complications, treatment options, and expected outcomes were discussed with the patient. The possibilities of reaction to medication, pulmonary aspiration, perforation of viscus, bleeding, recurrent infection, finding a normal gallbladder, the need for additional procedures, failure to diagnose a condition, the possible need to convert to an open procedure, and creating a complication requiring transfusion or operation were discussed with the patient. The patient and/or family concurred with the proposed plan, giving informed consent. The site of surgery properly noted/marked. The patient was taken to Operating Room, identified as [name] and the procedure verified as Laparoscopic Cholecystectomy with Intraoperative Cholangiograms. A Time Out was held  and the above information confirmed.  Prior to the induction of general anesthesia, antibiotic prophylaxis was administered. General endotracheal anesthesia was then administered and tolerated well. After the induction, the abdomen was prepped in the usual sterile fashion. The patient was positioned in the supine position with the left arm comfortably tucked, along with some reverse Trendelenburg.  Local anesthetic agent was injected into the skin near the umbilicus and an incision made. The midline fascia was incised and the Hasson technique was used to introduce a 12 mm port under direct vision. It was secured with a figure of eight Vicryl suture placed in the usual fashion. Pneumoperitoneum was then created with CO2 and tolerated well without any adverse changes in the patient's vital signs. Additional trocars were introduced under direct vision with an 11 mm trocar in the epigastrium and two  5 mm trocars in the right upper quadrant. All skin incisions were infiltrated with a local anesthetic agent before making the incision and placing the trocars.   The gallbladder was identified, the fundus grasped and retracted cephalad. Adhesions were lysed bluntly and with the electrocautery where indicated, taking care not to injure any adjacent organs or viscus. The infundibulum was grasped and retracted laterally, exposing the peritoneum overlying the triangle of Calot. This was then divided and exposed in a blunt fashion. The cystic duct was clearly identified and bluntly dissected circumferentially. The junctions of the gallbladder, cystic duct and common bile duct were clearly identified prior to the division of any linear structure.   An incision was made in the cystic duct and the cholangiogram catheter introduced. The catheter was secured using an endoclip. The study showed no stones and good visualization of the distal and proximal biliary tree. The catheter was then removed.   The cystic duct was then   ligated with surgical clips  on the patient side and  clipped on the gallbladder side and divided. The cystic artery was identified, dissected free, ligated with clips and divided as well. Posterior cystic artery clipped and divided.  The gallbladder was dissected from the liver bed in retrograde fashion with the electrocautery. The gallbladder was removed. The liver bed was irrigated and inspected. Hemostasis was achieved with the electrocautery. Copious irrigation was utilized and was repeatedly aspirated until clear all particulate matter. Hemostasis was achieved with no signs  Of bleeding or bile leakage.  Pneumoperitoneum was completely reduced after viewing removal of the trocars under direct vision. The wound was thoroughly irrigated and the fascia was then closed with a figure of eight suture; the skin was then closed with 4 O monocryl  and a sterile dressing of Liquid adhesive was applied.  Instrument, sponge, and needle counts were correct at closure and at the conclusion of the case.   Findings: Cholecystitis with Cholelithiasis  Estimated Blood Loss: Minimal         Drains: none          Total IV Fluids: 800 mL         Specimens: Gallbladder           Complications: None; patient tolerated the procedure well.         Disposition: PACU - hemodynamically stable.         Condition: stable

## 2016-05-21 NOTE — Anesthesia Procedure Notes (Signed)
Procedure Name: Intubation Date/Time: 05/21/2016 12:24 PM Performed by: Mariea Clonts Pre-anesthesia Checklist: Emergency Drugs available, Timeout performed, Patient identified, Suction available and Patient being monitored Patient Re-evaluated:Patient Re-evaluated prior to inductionOxygen Delivery Method: Circle system utilized Preoxygenation: Pre-oxygenation with 100% oxygen Intubation Type: IV induction, Cricoid Pressure applied and Rapid sequence Laryngoscope Size: Miller and 2 Grade View: Grade I Tube type: Oral Tube size: 7.0 mm Number of attempts: 1 Placement Confirmation: ETT inserted through vocal cords under direct vision,  breath sounds checked- equal and bilateral and positive ETCO2 Tube secured with: Tape Dental Injury: Teeth and Oropharynx as per pre-operative assessment

## 2016-05-21 NOTE — Progress Notes (Signed)
Placed on tele to monitor pt

## 2016-05-21 NOTE — Anesthesia Preprocedure Evaluation (Addendum)
Anesthesia Evaluation  Patient identified by MRN, date of birth, ID band Patient awake    Reviewed: Allergy & Precautions, NPO status , Patient's Chart, lab work & pertinent test results  Airway Mallampati: III  TM Distance: >3 FB Neck ROM: Full    Dental  (+) Teeth Intact, Dental Advisory Given   Pulmonary neg pulmonary ROS,    Pulmonary exam normal breath sounds clear to auscultation       Cardiovascular hypertension, Pt. on medications Normal cardiovascular exam Rhythm:Regular Rate:Normal     Neuro/Psych negative neurological ROS     GI/Hepatic Neg liver ROS, GERD  Medicated,  Endo/Other  negative endocrine ROS  Renal/GU negative Renal ROS     Musculoskeletal negative musculoskeletal ROS (+)   Abdominal   Peds  Hematology  (+) Blood dyscrasia, anemia ,   Anesthesia Other Findings Day of surgery medications reviewed with the patient.  Reproductive/Obstetrics                           Anesthesia Physical Anesthesia Plan  ASA: II  Anesthesia Plan: General   Post-op Pain Management:    Induction: Intravenous, Rapid sequence and Cricoid pressure planned  Airway Management Planned: Oral ETT  Additional Equipment:   Intra-op Plan:   Post-operative Plan: Extubation in OR  Informed Consent: I have reviewed the patients History and Physical, chart, labs and discussed the procedure including the risks, benefits and alternatives for the proposed anesthesia with the patient or authorized representative who has indicated his/her understanding and acceptance.   Dental advisory given  Plan Discussed with: CRNA  Anesthesia Plan Comments: (Risks/benefits of general anesthesia discussed with patient including risk of damage to teeth, lips, gum, and tongue, nausea/vomiting, allergic reactions to medications, and the possibility of heart attack, stroke and death.  All patient questions  answered.  Patient wishes to proceed.)        Anesthesia Quick Evaluation

## 2016-05-21 NOTE — Progress Notes (Addendum)
PROGRESS NOTE  [name]  [id number] DOB: 10/04/1956  DOA: 05/20/2016 PCP: Aundria Mems, MD   Brief Narrative:  60 year old female with PMH of HTN, HLD presented to [facility] ED on 05/20/16 with near syncopal episode attributed to dehydration, orthostasis versus vasovagal in the setting of nausea, vomiting and abdominal pain, acute kidney injury and profound hypokalemia. Patient has been unwell since February 2017 with persistent RUQ/epigastric region abdominal pain, nausea, vomiting, unable to consistently keep food down, significant weight loss (30 pounds since February) without constipation or diarrhea. EGD by Dr. Benson Norway apparently unrevealing, supposed to have had a colonoscopy on 6/6 in [place], was scheduled for outpatient HIDA scan on day of admission and while in the waiting room, she developed generalized weakness, lightheadedness and nausea.   Assessment & Plan:   Principal Problem:   Near syncope Active Problems:   Hypertension   Microcytic anemia   Hyperlipidemia with target LDL less than 100   Abdominal pain   Acute kidney injury ([place])   Hypokalemia   Nausea and vomiting   AP (abdominal pain)   Acute hypokalemia   Calculus of gallbladder without cholecystitis without obstruction   Near syncope/syncope - Likely secondary to hypotension from dehydration. - Hydrated with IV fluids. Blood pressures have normalized. Asymptomatic of dizziness or lightheadedness. Troponin times one: Negative. Telemetry: Sinus rhythm without arrhythmias.  Abdominal pain/chronic cholecystitis - Has been having chronic abdominal pain, nausea, intermittent vomiting, decreased appetite and weight loss since February 2017. Has undergone extensive evaluation as outpatient. HIDA scan suggested chronic cholecystitis. Recent CT abdomen without evidence of acute cholecystitis. - Gen. surgery was consulted and patient underwent laparoscopic cholecystectomy on 6/6.  Hypotension/dehydration -  Resolved after IV fluids. Secondary to GI losses and poor oral intake.  Acute kidney injury - Creatinine 1.5 on admission. Held home lisinopril and HCTZ. Hydrated with IV fluids. Resolved.  Essential hypertension - Management as indicated above. Holding HCTZ , lisinopril and amlodipine. When necessary hydralazine for today and consider resuming some of her antihypertensives tomorrow.  Hypokalemia - Potassium 2.4 in the ED. Magnesium 1.7. Replace aggressively and follow BMP closely. Antinuclear telemetry.  Anemia, microcytic - Follow CBCs. Anemia panel suggests possible chronic disease versus iron deficiency. Colonoscopy to be rescheduled.   DVT prophylaxis: Lovenox Code Status: Full Family Communication: discussed with patient this morning prior to procedure. No family at bedside. Disposition Plan: DC home when medically stable.   Consultants:   CCS  Procedures:   Laparoscopic cholecystectomy 6/6  Antimicrobials:   None    Subjective: Seen this morning prior to surgery. Continue to complain of intermittent RUQ/epigastric abdominal pain. No chest pain, dyspnea, lightheadedness or dizziness.  Objective:  Filed Vitals:   05/21/16 1345 05/21/16 1400 05/21/16 1415 05/21/16 1422  BP: 133/78 134/81 138/86   Pulse: 78 80 74   Temp: 97.6 F (36.4 C)   97.4 F (36.3 C)  TempSrc:      Resp: 12 17 9    Height:      Weight:      SpO2: 100% 100% 100%     Intake/Output Summary (Last 24 hours) at 05/21/16 1752 Last data filed at 05/21/16 1635  Gross per 24 hour  Intake 2829.17 ml  Output    475 ml  Net 2354.17 ml   Filed Weights   05/20/16 0756 05/20/16 2102  Weight: 58.06 kg (128 lb) 61.281 kg (135 lb 1.6 oz)    Examination:  General exam: Pleasant middle-aged female sitting up comfortably at edge of bed  this morning. Respiratory system: Clear to auscultation. Respiratory effort normal. Cardiovascular system: S1 & S2 heard, RRR. No JVD, murmurs, rubs, gallops or  clicks. No pedal edema. telemetry: Sinus rhythm.  Gastrointestinal system: Abdomen is nondistended, soft and mild epigastric/RUQ tenderness without peritoneal signs. No organomegaly or masses felt. Normal bowel sounds heard. Central nervous system: Alert and oriented. No focal neurological deficits. Extremities: Symmetric 5 x 5 power. Skin: No rashes, lesions or ulcers Psychiatry: Judgement and insight appear normal. Mood & affect appropriate.     Data Reviewed: I have personally reviewed following labs and imaging studies  CBC:  Recent Labs Lab 05/20/16 0750 05/20/16 0813 05/21/16 0734  WBC 5.8  --  4.5  NEUTROABS 2.4  --   --   HGB 10.6* 12.2 9.7*  HCT 31.5* 36.0 30.6*  MCV 66.9*  --  68.8*  PLT 210  --  0000000   Basic Metabolic Panel:  Recent Labs Lab 05/20/16 0750 05/20/16 0813 05/21/16 0734 05/21/16 1626  NA 134* 137 139 139  K 2.4* 2.5* 2.8* 3.0*  CL 96* 96* 102 104  CO2 20*  --  23 23  GLUCOSE 86 82 67 75  BUN 6 4* <5* <5*  CREATININE 1.50* 1.10* 1.16* 1.10*  CALCIUM 10.0  --  9.0 8.6*  MG 1.7  --   --   --    GFR: Estimated Creatinine Clearance: 45.8 mL/min (by C-G formula based on Cr of 1.1). Liver Function Tests:  Recent Labs Lab 05/20/16 0750  AST 25  ALT 7*  ALKPHOS 60  BILITOT 1.2  PROT 7.6  ALBUMIN 3.6   No results for input(s): LIPASE, AMYLASE in the last 168 hours. No results for input(s): AMMONIA in the last 168 hours. Coagulation Profile: No results for input(s): INR, PROTIME in the last 168 hours. Cardiac Enzymes:  Recent Labs Lab 05/20/16 0750  TROPONINI <0.03   BNP (last 3 results) No results for input(s): PROBNP in the last 8760 hours. HbA1C: No results for input(s): HGBA1C in the last 72 hours. CBG:  Recent Labs Lab 05/20/16 0816  GLUCAP 85   Lipid Profile: No results for input(s): CHOL, HDL, LDLCALC, TRIG, CHOLHDL, LDLDIRECT in the last 72 hours. Thyroid Function Tests: No results for input(s): TSH, T4TOTAL,  FREET4, T3FREE, THYROIDAB in the last 72 hours. Anemia Panel:  Recent Labs  05/20/16 2027  VITAMINB12 424  FOLATE 8.0  FERRITIN 170  TIBC 165*  IRON 68  RETICCTPCT <0.4*    Sepsis Labs: No results for input(s): PROCALCITON, LATICACIDVEN in the last 168 hours.  No results found for this or any previous visit (from the past 240 hour(s)).       Radiology Studies: Nm Hepatobiliary Liver Func  05/20/2016  CLINICAL DATA:  60 year old female with nausea vomiting and right upper quadrant pain. Abnormal right upper quadrant ultrasound on 05/12/2016 demonstrating possible gallstone and sludge, but no strong evidence of acute cholecystitis at that time. Initial encounter. EXAM: NUCLEAR MEDICINE HEPATOBILIARY IMAGING TECHNIQUE: Sequential images of the abdomen were obtained out to 60 minutes following intravenous administration of radiopharmaceutical. RADIOPHARMACEUTICALS:  6.4 mCi Tc-72m  Choletec IV COMPARISON:  Ultrasound and CT Abdomen and Pelvis 05/12/2016 FINDINGS: Prompt radiotracer uptake by the liver and clearance of the blood pool. Prompt CBD activity by 20 minutes. Small bowel activity by 25-30 minutes which increases over the first hour of the study. No gallbladder activity occurred at 1 hour. The study then was augmented with 2.3 mg IV morphine and additional imaging  performed. There is gallbladder activity on the first set of the additional images, which increases to the completion of the exam. IMPRESSION: 1. Patent cystic duct, but gallbladder activity visualized only after augmentation with morphine suggesting chronic cholecystitis. 2. Patent CBD. Electronically Signed   By: Genevie Ann M.D.   On: 05/20/2016 16:51        Scheduled Meds: . enoxaparin (LOVENOX) injection  40 mg Subcutaneous Q24H  . feeding supplement  1 Container Oral TID BM  . potassium chloride  10 mEq Intravenous Q1 Hr x 4  . potassium chloride  40 mEq Oral Once   Continuous Infusions: . sodium chloride 75  mL/hr at 05/21/16 1500  . lactated ringers    . lactated ringers          Time spent: 45 minutes.    [facility], MD [facility] Pager [phone]  If 7PM-7AM, please contact night-coverage [email or web address] Password [email or web address] 05/21/2016, 5:52 PM

## 2016-05-22 ENCOUNTER — Encounter (HOSPITAL_COMMUNITY): Payer: Self-pay | Admitting: Surgery

## 2016-05-22 DIAGNOSIS — E43 Unspecified severe protein-calorie malnutrition: Secondary | ICD-10-CM | POA: Insufficient documentation

## 2016-05-22 DIAGNOSIS — R55 Syncope and collapse: Secondary | ICD-10-CM | POA: Diagnosis not present

## 2016-05-22 LAB — CBC
HCT: 27.7 % — ABNORMAL LOW (ref 36.0–46.0)
Hemoglobin: 8.7 g/dL — ABNORMAL LOW (ref 12.0–15.0)
MCH: 21.6 pg — AB (ref 26.0–34.0)
MCHC: 31.4 g/dL (ref 30.0–36.0)
MCV: 68.9 fL — ABNORMAL LOW (ref 78.0–100.0)
Platelets: 170 10*3/uL (ref 150–400)
RBC: 4.02 MIL/uL (ref 3.87–5.11)
RDW: 16 % — AB (ref 11.5–15.5)
WBC: 5.9 10*3/uL (ref 4.0–10.5)

## 2016-05-22 LAB — COMPREHENSIVE METABOLIC PANEL
ALBUMIN: 2.9 g/dL — AB (ref 3.5–5.0)
ALK PHOS: 46 U/L (ref 38–126)
ALT: 11 U/L — AB (ref 14–54)
AST: 54 U/L — AB (ref 15–41)
Anion gap: 13 (ref 5–15)
BILIRUBIN TOTAL: 1 mg/dL (ref 0.3–1.2)
CALCIUM: 8.5 mg/dL — AB (ref 8.9–10.3)
CO2: 24 mmol/L (ref 22–32)
CREATININE: 1.14 mg/dL — AB (ref 0.44–1.00)
Chloride: 103 mmol/L (ref 101–111)
GFR calc Af Amer: 58 mL/min — ABNORMAL LOW (ref >60)
GFR calc non Af Amer: 50 mL/min — ABNORMAL LOW (ref >60)
GLUCOSE: 75 mg/dL (ref 65–99)
Potassium: 3.5 mmol/L (ref 3.5–5.1)
Sodium: 140 mmol/L (ref 135–145)
TOTAL PROTEIN: 6.2 g/dL — AB (ref 6.5–8.1)

## 2016-05-22 MED ORDER — PROMETHAZINE HCL 25 MG/ML IJ SOLN
6.2500 mg | Freq: Three times a day (TID) | INTRAMUSCULAR | Status: DC | PRN
  Administered 2016-05-22 – 2016-05-23 (×3): 12.5 mg via INTRAVENOUS
  Filled 2016-05-22 (×3): qty 1

## 2016-05-22 MED ORDER — BISACODYL 10 MG RE SUPP
10.0000 mg | Freq: Once | RECTAL | Status: AC
  Administered 2016-05-22: 10 mg via RECTAL
  Filled 2016-05-22: qty 1

## 2016-05-22 NOTE — Anesthesia Postprocedure Evaluation (Signed)
Anesthesia Post Note  Patient: [name]  Procedure(s) Performed: Procedure(s) (LRB): LAPAROSCOPIC CHOLECYSTECTOMY (N/A)  Patient location during evaluation: PACU Anesthesia Type: General Level of consciousness: awake and alert Pain management: pain level controlled Vital Signs Assessment: post-procedure vital signs reviewed and stable Respiratory status: spontaneous breathing, nonlabored ventilation, respiratory function stable and patient connected to nasal cannula oxygen Cardiovascular status: blood pressure returned to baseline and stable Postop Assessment: no signs of nausea or vomiting Anesthetic complications: no    Last Vitals:  Filed Vitals:   05/21/16 2034 05/22/16 0444  BP: 132/78 102/68  Pulse: 80 81  Temp: 36.9 C 36.9 C  Resp: 16 18    Last Pain:  Filed Vitals:   05/22/16 0528  PainSc: Asleep                 Catalina Gravel

## 2016-05-22 NOTE — Progress Notes (Signed)
[facility] Progress Note  1 Day Post-Op  Subjective: Pt laying in bed in NAD. Reports 2 episodes of emesis this AM. States she threw up her potassium pill this morning. Mild abdominal soreness, worse with movement, not unexpected and controlled with pain medication. No flatus or BM this AM. Ambulating to the bathroom. Patient complains of constipation and states she has not had a BM in 2 days, requests and enema.   Objective: Vital signs in last 24 hours: Temp:  [97.4 F (36.3 C)-98.5 F (36.9 C)] 98 F (36.7 C) (06/07 0947) Pulse Rate:  [71-81] 71 (06/07 0947) Resp:  [9-18] 16 (06/07 0947) BP: (102-145)/(63-86) 104/63 mmHg (06/07 0947) SpO2:  [95 %-100 %] 95 % (06/07 0947) Weight:  [59.875 kg (132 lb)] 59.875 kg (132 lb) (06/07 0528) Last BM Date: 05/22/16  Intake/Output from previous day: 06/06 0701 - 06/07 0700 In: 1750 [I.V.:1500; IV Piggyback:250] Out: 725 [Urine:725] Intake/Output this shift: Total I/O In: 390 [P.O.:90; I.V.:300] Out: 0   PE: Gen:  Alert, NAD, pleasant Card:  RRR, no M/G/R heard, radial pulses 2+ BL Pulm:  CTA, no W/R/R Abd: Soft, appropriately TTP, mildly distended, hypoactive BS, no HSM, incisions C/D/I Ext:  No erythema, edema, or tenderness  Lab Results:   Recent Labs  05/21/16 0734 05/22/16 0706  WBC 4.5 5.9  HGB 9.7* 8.7*  HCT 30.6* 27.7*  PLT 188 170   BMET  Recent Labs  05/21/16 1626 05/22/16 0706  NA 139 140  K 3.0* 3.5  CL 104 103  CO2 23 24  GLUCOSE 75 75  BUN <5* <5*  CREATININE 1.10* 1.14*  CALCIUM 8.6* 8.5*   PT/INR No results for input(s): LABPROT, INR in the last 72 hours. CMP     Component Value Date/Time   NA 140 05/22/2016 0706   K 3.5 05/22/2016 0706   CL 103 05/22/2016 0706   CO2 24 05/22/2016 0706   GLUCOSE 75 05/22/2016 0706   BUN <5* 05/22/2016 0706   CREATININE 1.14* 05/22/2016 0706   CREATININE 1.18* 02/26/2016 1157   CALCIUM 8.5* 05/22/2016 0706   PROT 6.2* 05/22/2016 0706   ALBUMIN 2.9* 05/22/2016 0706   AST 54* 05/22/2016 0706   ALT 11* 05/22/2016 0706   ALKPHOS 46 05/22/2016 0706   BILITOT 1.0 05/22/2016 0706   GFRNONAA 50* 05/22/2016 0706   GFRAA 58* 05/22/2016 0706   Lipase     Component Value Date/Time   LIPASE 23 05/12/2016 0810       Studies/Results: Nm Hepatobiliary Liver Func  05/20/2016  CLINICAL DATA:  60 year old female with nausea vomiting and right upper quadrant pain. Abnormal right upper quadrant ultrasound on 05/12/2016 demonstrating possible gallstone and sludge, but no strong evidence of acute cholecystitis at that time. Initial encounter. EXAM: NUCLEAR MEDICINE HEPATOBILIARY IMAGING TECHNIQUE: Sequential images of the abdomen were obtained out to 60 minutes following intravenous administration of radiopharmaceutical. RADIOPHARMACEUTICALS:  6.4 mCi Tc-70m  Choletec IV COMPARISON:  Ultrasound and CT Abdomen and Pelvis 05/12/2016 FINDINGS: Prompt radiotracer uptake by the liver and clearance of the blood pool. Prompt CBD activity by 20 minutes. Small bowel activity by 25-30 minutes which increases over the first hour of the study. No gallbladder activity occurred at 1 hour. The study then was augmented with 2.3 mg IV morphine and additional imaging performed. There is gallbladder activity on the first set of the additional images, which increases to the completion of the exam. IMPRESSION: 1. Patent cystic duct, but gallbladder activity visualized only after  augmentation with morphine suggesting chronic cholecystitis. 2. Patent CBD. Electronically Signed   By: Genevie Ann M.D.   On: 05/20/2016 16:51    Anti-infectives: Anti-infectives    Start     Dose/Rate Route Frequency Ordered Stop   05/21/16 1150  [MAR Hold]  ceFAZolin (ANCEF) IVPB 2g/100 mL premix     (MAR Hold since 05/21/16 1119)   2 g 200 mL/hr over 30 Minutes Intravenous To [facility] 05/21/16 0745 05/21/16 1217       Assessment/Plan  POD#1 Laparoscopic cholecystectomy with  IOC (05/21/16 by Dr. Donella Stade)   abdominal pain/chronic cholecystitis       - IVF, pain control, antiemetics (zofran, phenergan)       -IS  Constipation: ducolax suppository ordered; further management/enema per medicine  FEN: full liquids ID: single prophylactic dose Ancef 05/21/16 DVT Proph: lovenox, SCD's Dispo: floor, ambulate  Near Syncope- suspect due to dehydration/hypotension; neg cardiac enzymes and normal EKG Hypokalemia- 3.0 on 6/7  AKI HTN Microcytic anemia - trend CBC's  Jill Alexanders , [facility] 05/22/2016, 10:10 AM Pager: [phone] Mon-Fri 7:00 am-4:30 pm Sat-Sun 7:00 am-11:30 am

## 2016-05-22 NOTE — Progress Notes (Signed)
PROGRESS NOTE  [name]  [id number] DOB: [birth year]  DOA: 05/20/2016 PCP: Aundria Mems, MD   Brief Narrative:  59 year old female with PMH of HTN, HLD presented to [facility] ED on 05/20/16 with near syncopal episode attributed to dehydration, orthostasis versus vasovagal in the setting of nausea, vomiting and abdominal pain, acute kidney injury and profound hypokalemia. Patient has been unwell since February 2017 with persistent RUQ/epigastric region abdominal pain, nausea, vomiting, unable to consistently keep food down, significant weight loss (30 pounds since February) without constipation or diarrhea. EGD by Dr. Benson Norway apparently unrevealing, supposed to have had a colonoscopy on 6/6 in [place], was scheduled for outpatient HIDA scan on day of admission and while in the waiting room, she developed generalized weakness, lightheadedness and nausea.   Assessment & Plan:   Principal Problem:   Near syncope Active Problems:   Hypertension   Microcytic anemia   Hyperlipidemia with target LDL less than 100   Abdominal pain   Acute kidney injury ([place])   Hypokalemia   Nausea and vomiting   AP (abdominal pain)   Acute hypokalemia   Calculus of gallbladder without cholecystitis without obstruction   Protein-calorie malnutrition, severe   Near syncope/syncope - Likely secondary to hypotension from dehydration. - Hydrated with IV fluids. Blood pressures have normalized. Asymptomatic of dizziness or lightheadedness. Troponin times one: Negative. Telemetry: Sinus rhythm without arrhythmias.  Abdominal pain/chronic cholecystitis - Has been having chronic abdominal pain, nausea, intermittent vomiting, decreased appetite and weight loss since February 2017. Has undergone extensive evaluation as outpatient. HIDA scan suggested chronic cholecystitis. Recent CT abdomen without evidence of acute cholecystitis. - Gen. surgery was consulted and patient underwent laparoscopic cholecystectomy  on 6/6. - improvement in pain. Patient only complaining of some emesis after trying to consume some soup.  Hypotension/dehydration - Resolved after IV fluids. Secondary to GI losses and poor oral intake.  Acute kidney injury - Creatinine 1.5 on admission. Held home lisinopril and HCTZ. Hydrated with IV fluids. Resolved.  Essential hypertension - Management as indicated above. Holding HCTZ , lisinopril and amlodipine. When necessary hydralazine  Hypokalemia - Potassium 2.4 in the ED. Magnesium 1.7. Replace aggressively and resolved  Anemia, microcytic - Follow CBCs. Anemia panel suggests possible chronic disease versus iron deficiency. Colonoscopy to be rescheduled.   DVT prophylaxis: Lovenox Code Status: Full Family Communication: discussed with patient this morning prior to procedure. No family at bedside. Disposition Plan: DC home when medically stable.   Consultants:   CCS  Procedures:   Laparoscopic cholecystectomy 6/6  Antimicrobials:   None    Subjective: Pt has no new complaints. Still having some emesis after eating.  Objective:  Filed Vitals:   05/21/16 2034 05/22/16 0444 05/22/16 0528 05/22/16 0947  BP: 132/78 102/68  104/63  Pulse: 80 81  71  Temp: 98.5 F (36.9 C) 98.4 F (36.9 C)  98 F (36.7 C)  TempSrc:    Oral  Resp: 16 18  16   Height:      Weight:   59.875 kg (132 lb)   SpO2: 100% 98%  95%    Intake/Output Summary (Last 24 hours) at 05/22/16 1718 Last data filed at 05/22/16 1458  Gross per 24 hour  Intake    440 ml  Output    250 ml  Net    190 ml   Filed Weights   05/20/16 0756 05/20/16 2102 05/22/16 0528  Weight: 58.06 kg (128 lb) 61.281 kg (135 lb 1.6 oz) 59.875 kg (132 lb)  Examination:  General exam: Pleasant middle-aged female sitting up comfortably at edge of bed this morning. Respiratory system: Clear to auscultation. Respiratory effort normal. Cardiovascular system: S1 & S2 heard, RRR. No JVD, murmurs, rubs, gallops  or clicks. No pedal edema. telemetry: Sinus rhythm.  Gastrointestinal system: Abdomen is nondistended, soft and mild epigastric/RUQ tenderness without peritoneal signs. No organomegaly or masses felt. Normal bowel sounds heard. Central nervous system: Alert and oriented. No focal neurological deficits. Extremities: Symmetric 5 x 5 power. Skin: No rashes, lesions or ulcers Psychiatry: Judgement and insight appear normal. Mood & affect appropriate.     Data Reviewed: I have personally reviewed following labs and imaging studies  CBC:  Recent Labs Lab 05/20/16 0750 05/20/16 0813 05/21/16 0734 05/22/16 0706  WBC 5.8  --  4.5 5.9  NEUTROABS 2.4  --   --   --   HGB 10.6* 12.2 9.7* 8.7*  HCT 31.5* 36.0 30.6* 27.7*  MCV 66.9*  --  68.8* 68.9*  PLT 210  --  188 [id number]   Basic Metabolic Panel:  Recent Labs Lab 05/20/16 0750 05/20/16 0813 05/21/16 0734 05/21/16 1626 05/22/16 0706  NA 134* 137 139 139 140  K 2.4* 2.5* 2.8* 3.0* 3.5  CL 96* 96* 102 104 103  CO2 20*  --  23 23 24   GLUCOSE 86 82 67 75 75  BUN 6 4* <5* <5* <5*  CREATININE 1.50* 1.10* 1.16* 1.10* 1.14*  CALCIUM 10.0  --  9.0 8.6* 8.5*  MG 1.7  --   --   --   --    GFR: Estimated Creatinine Clearance: 44.2 mL/min (by C-G formula based on Cr of 1.14). Liver Function Tests:  Recent Labs Lab 05/20/16 0750 05/22/16 0706  AST 25 54*  ALT 7* 11*  ALKPHOS 60 46  BILITOT 1.2 1.0  PROT 7.6 6.2*  ALBUMIN 3.6 2.9*   No results for input(s): LIPASE, AMYLASE in the last 168 hours. No results for input(s): AMMONIA in the last 168 hours. Coagulation Profile: No results for input(s): INR, PROTIME in the last 168 hours. Cardiac Enzymes:  Recent Labs Lab 05/20/16 0750  TROPONINI <0.03   BNP (last 3 results) No results for input(s): PROBNP in the last 8760 hours. HbA1C: No results for input(s): HGBA1C in the last 72 hours. CBG:  Recent Labs Lab 05/20/16 0816  GLUCAP 85   Lipid Profile: No results for  input(s): CHOL, HDL, LDLCALC, TRIG, CHOLHDL, LDLDIRECT in the last 72 hours. Thyroid Function Tests: No results for input(s): TSH, T4TOTAL, FREET4, T3FREE, THYROIDAB in the last 72 hours. Anemia Panel:  Recent Labs  05/20/16 2027  VITAMINB12 424  FOLATE 8.0  FERRITIN 170  TIBC 165*  IRON 68  RETICCTPCT <0.4*    Sepsis Labs: No results for input(s): PROCALCITON, LATICACIDVEN in the last 168 hours.  No results found for this or any previous visit (from the past 240 hour(s)).    Radiology Studies: No results found.   Scheduled Meds: . enoxaparin (LOVENOX) injection  40 mg Subcutaneous Q24H  . feeding supplement  1 Container Oral TID BM  . pantoprazole  40 mg Oral QAC breakfast   Continuous Infusions: . lactated ringers    . lactated ringers    Time spent: 35 minutes.  Velvet Bathe, MD [facility] Pager [phone]  If 7PM-7AM, please contact night-coverage [email or web address] Password [email or web address] 05/22/2016, 5:18 PM

## 2016-05-22 NOTE — Progress Notes (Signed)
Nutrition Follow-up  DOCUMENTATION CODES:   Severe malnutrition in context of chronic illness  INTERVENTION:  Continue Boost Breeze po TID, each supplement provides 250 kcal and 9 grams of protein.  Monitor magnesium, potassium, and phosphorus daily for at least 3 days, MD to replete as needed, as pt is at risk for refeeding syndrome given severe malnutrition and prolonged very poor po intake.  Encourage adequate PO intake.   RD to continue to monitor.   NUTRITION DIAGNOSIS:   Inadequate oral intake related to poor appetite as evidenced by per patient/family report; ongoing  GOAL:   Patient will meet greater than or equal to 90% of their needs; not met  MONITOR:   PO intake, Supplement acceptance, Diet advancement, Weight trends, Labs, I & O's  REASON FOR ASSESSMENT:   Malnutrition Screening Tool    ASSESSMENT:   60 y.o. female with a Past Medical History of HTN, HLD who presents with near syncopal episode likely secondary to irritation, orthostasis/vasovagal episode in the setting of abdominal pain from underlying cholelithiasis and hypokalemia, AKI likely from dehydration.  Procedure (6/6): LAPAROSCOPIC CHOLECYSTECTOMY  Meal completion has been 25%. Pt is currently on a full liquid diet. Pt was asleep during time of visit. Family at bedside wanted RD to let patient rest and not wake her up. Family member reports pt unable to tolerate much of her food. She reports pt has been having vomiting after consumption of food. She reports this is the same situation at home PTA which has been ongoing for more than 2 months. Pt is at risk for refeeding syndrome due to severe malnutrition and prolonged very poor po intake/poor tolerance. Pt with a 21% weight loss in 4 months. Pt currently has Boost Breeze ordered. Family reports pt has not been able to try Boost Breeze much yet. RD to continue with current orders.   Labs and medications reviewed.   Diet Order:  Diet full liquid Room  service appropriate?: Yes; Fluid consistency:: Thin  Skin:   (Incision on abdomen)  Last BM:  6/7  Height:   Ht Readings from Last 1 Encounters:  05/20/16 '5\' 4"'$  (1.626 m)    Weight:   Wt Readings from Last 1 Encounters:  05/22/16 132 lb (59.875 kg)    Ideal Body Weight:  54.5 kg  BMI:  Body mass index is 22.65 kg/(m^2).  Estimated Nutritional Needs:   Kcal:  1700-1850  Protein:  75-85 grams  Fluid:  1.7 - 1.9 L/day  EDUCATION NEEDS:   No education needs identified at this time  Corrin Parker, MS, RD, LDN Pager # [phone] After hours/ weekend pager # [phone]

## 2016-05-22 NOTE — Progress Notes (Signed)
Patient unable to tolerate clear or full liquid diet d/t nausea.  Patient's son would like for MD to call him after seeing patient.  MD notified.

## 2016-05-23 ENCOUNTER — Observation Stay (HOSPITAL_COMMUNITY): Payer: 59

## 2016-05-23 DIAGNOSIS — R55 Syncope and collapse: Secondary | ICD-10-CM | POA: Diagnosis not present

## 2016-05-23 LAB — COMPREHENSIVE METABOLIC PANEL
ALT: 11 U/L — ABNORMAL LOW (ref 14–54)
AST: 47 U/L — AB (ref 15–41)
Albumin: 2.9 g/dL — ABNORMAL LOW (ref 3.5–5.0)
Alkaline Phosphatase: 53 U/L (ref 38–126)
Anion gap: 12 (ref 5–15)
BILIRUBIN TOTAL: 1.3 mg/dL — AB (ref 0.3–1.2)
CO2: 21 mmol/L — ABNORMAL LOW (ref 22–32)
CREATININE: 1.24 mg/dL — AB (ref 0.44–1.00)
Calcium: 8.6 mg/dL — ABNORMAL LOW (ref 8.9–10.3)
Chloride: 104 mmol/L (ref 101–111)
GFR, EST AFRICAN AMERICAN: 53 mL/min — AB (ref >60)
GFR, EST NON AFRICAN AMERICAN: 46 mL/min — AB (ref >60)
Glucose, Bld: 76 mg/dL (ref 65–99)
POTASSIUM: 3.1 mmol/L — AB (ref 3.5–5.1)
Sodium: 137 mmol/L (ref 135–145)
TOTAL PROTEIN: 6.3 g/dL — AB (ref 6.5–8.1)

## 2016-05-23 LAB — CBC
HEMATOCRIT: 27 % — AB (ref 36.0–46.0)
Hemoglobin: 8.7 g/dL — ABNORMAL LOW (ref 12.0–15.0)
MCH: 22 pg — AB (ref 26.0–34.0)
MCHC: 32.2 g/dL (ref 30.0–36.0)
MCV: 68.4 fL — AB (ref 78.0–100.0)
PLATELETS: 139 10*3/uL — AB (ref 150–400)
RBC: 3.95 MIL/uL (ref 3.87–5.11)
RDW: 16 % — AB (ref 11.5–15.5)
WBC: 5.9 10*3/uL (ref 4.0–10.5)

## 2016-05-23 LAB — LIPASE, BLOOD: LIPASE: 16 U/L (ref 11–51)

## 2016-05-23 MED ORDER — ENSURE ENLIVE PO LIQD: 237.0000 mL | Freq: Two times a day (BID) | ORAL | Status: DC

## 2016-05-23 MED ORDER — POTASSIUM CHLORIDE CRYS ER 20 MEQ PO TBCR
40.0000 meq | EXTENDED_RELEASE_TABLET | Freq: Once | ORAL | Status: DC
  Filled 2016-05-23: qty 2

## 2016-05-23 MED ORDER — POTASSIUM CHLORIDE 10 MEQ/100ML IV SOLN
10.0000 meq | INTRAVENOUS | Status: AC
  Administered 2016-05-24 (×4): 10 meq via INTRAVENOUS
  Filled 2016-05-23 (×4): qty 100

## 2016-05-23 MED ORDER — ENSURE ENLIVE PO LIQD
237.0000 mL | Freq: Three times a day (TID) | ORAL | Status: DC
  Administered 2016-05-24 – 2016-05-30 (×16): 237 mL via ORAL

## 2016-05-23 MED ORDER — TECHNETIUM TC 99M MEBROFENIN IV KIT
5.0000 | PACK | Freq: Once | INTRAVENOUS | Status: AC | PRN
  Administered 2016-05-23: 5 via INTRAVENOUS

## 2016-05-23 NOTE — Progress Notes (Signed)
PROGRESS NOTE  [name]  [id number] DOB: November 10, 1956  DOA: 05/20/2016 PCP: Aundria Mems, MD   Brief Narrative:  60 year old female with PMH of HTN, HLD presented to [facility] ED on 05/20/16 with near syncopal episode attributed to dehydration, orthostasis versus vasovagal in the setting of nausea, vomiting and abdominal pain, acute kidney injury and profound hypokalemia. Patient has been unwell since February 2017 with persistent RUQ/epigastric region abdominal pain, nausea, vomiting, unable to consistently keep food down, significant weight loss (30 pounds since February) without constipation or diarrhea. EGD by Dr. Benson Norway apparently unrevealing, supposed to have had a colonoscopy on 6/6 in [place], was scheduled for outpatient HIDA scan on day of admission and while in the waiting room, she developed generalized weakness, lightheadedness and nausea.   Assessment & Plan:   Principal Problem:   Near syncope Active Problems:   Hypertension   Microcytic anemia   Hyperlipidemia with target LDL less than 100   Abdominal pain   Acute kidney injury ([place])   Hypokalemia   Nausea and vomiting   AP (abdominal pain)   Acute hypokalemia   Calculus of gallbladder without cholecystitis without obstruction   Protein-calorie malnutrition, severe   Near syncope/syncope - Likely secondary to hypotension from dehydration. - Hydrated with IV fluids. Blood pressures have normalized. Asymptomatic of dizziness or lightheadedness. Troponin times one: Negative. Telemetry: Sinus rhythm without arrhythmias.  Abdominal pain/chronic cholecystitis - Has been having chronic abdominal pain, nausea, intermittent vomiting, decreased appetite and weight loss since February 2017. Has undergone extensive evaluation as outpatient. HIDA scan suggested chronic cholecystitis. Recent CT abdomen without evidence of acute cholecystitis. - Gen. surgery was consulted and patient underwent laparoscopic cholecystectomy  on 6/6. - improvement in pain. Patient still complaining of some emesis after trying to consume some soup. - Should nausea and emesis persist will consider gastric emptying scan and GI consult.  Hypotension/dehydration - Resolved after IV fluids. Secondary to GI losses and poor oral intake.  Acute kidney injury - Creatinine 1.5 on admission. Held home lisinopril and HCTZ. Hydrated with IV fluids. Resolved.  Essential hypertension - Management as indicated above. Holding HCTZ , lisinopril and amlodipine. When necessary hydralazine  Hypokalemia - replace and reassess  Anemia, microcytic - Follow CBCs. Anemia panel suggests possible chronic disease versus iron deficiency. Colonoscopy to be rescheduled.   DVT prophylaxis: Lovenox Code Status: Full Family Communication: discussed with patient this morning prior to procedure. No family at bedside. Disposition Plan: DC home when medically stable.   Consultants:   CCS  Procedures:   Laparoscopic cholecystectomy 6/6  Antimicrobials:   None    Subjective: Pt has no new complaints. Still having nausea  Objective:  Filed Vitals:   05/22/16 1834 05/22/16 2108 05/23/16 0435 05/23/16 0904  BP: 142/82 135/73 113/69 103/68  Pulse: 109 72 91 96  Temp: 99.8 F (37.7 C) 97.3 F (36.3 C) 98.9 F (37.2 C) 98.7 F (37.1 C)  TempSrc: Oral Oral Oral Oral  Resp: 17 18 19 18   Height:      Weight:  60.011 kg (132 lb 4.8 oz)    SpO2: 100% 95% 97% 99%    Intake/Output Summary (Last 24 hours) at 05/23/16 1638 Last data filed at 05/23/16 1400  Gross per 24 hour  Intake    480 ml  Output      0 ml  Net    480 ml   Filed Weights   05/20/16 2102 05/22/16 0528 05/22/16 2108  Weight: 61.281 kg (135 lb 1.6 oz)  59.875 kg (132 lb) 60.011 kg (132 lb 4.8 oz)    Examination:  General exam: Pleasant middle-aged female sitting up comfortably at edge of bed this morning. Respiratory system: Clear to auscultation. Respiratory effort  normal. Cardiovascular system: S1 & S2 heard, RRR. No JVD, murmurs, rubs, gallops or clicks. No pedal edema. telemetry: Sinus rhythm.  Gastrointestinal system: Abdomen is nondistended, soft and mild epigastric/RUQ tenderness without peritoneal signs. No organomegaly or masses felt. Normal bowel sounds heard. Central nervous system: Alert and oriented. No focal neurological deficits. Extremities: Symmetric 5 x 5 power. Skin: No rashes, lesions or ulcers Psychiatry: Judgement and insight appear normal. Mood & affect appropriate.     Data Reviewed: I have personally reviewed following labs and imaging studies  CBC:  Recent Labs Lab 05/20/16 0750 05/20/16 0813 05/21/16 0734 05/22/16 0706 05/23/16 1312  WBC 5.8  --  4.5 5.9 5.9  NEUTROABS 2.4  --   --   --   --   HGB 10.6* 12.2 9.7* 8.7* 8.7*  HCT 31.5* 36.0 30.6* 27.7* 27.0*  MCV 66.9*  --  68.8* 68.9* 68.4*  PLT 210  --  188 170 [id number]*   Basic Metabolic Panel:  Recent Labs Lab 05/20/16 0750 05/20/16 0813 05/21/16 0734 05/21/16 1626 05/22/16 0706 05/23/16 1312  NA 134* 137 139 139 140 137  K 2.4* 2.5* 2.8* 3.0* 3.5 3.1*  CL 96* 96* 102 104 103 104  CO2 20*  --  23 23 24  21*  GLUCOSE 86 82 67 75 75 76  BUN 6 4* <5* <5* <5* <5*  CREATININE 1.50* 1.10* 1.16* 1.10* 1.14* 1.24*  CALCIUM 10.0  --  9.0 8.6* 8.5* 8.6*  MG 1.7  --   --   --   --   --    GFR: Estimated Creatinine Clearance: 40.6 mL/min (by C-G formula based on Cr of 1.24). Liver Function Tests:  Recent Labs Lab 05/20/16 0750 05/22/16 0706 05/23/16 1312  AST 25 54* 47*  ALT 7* 11* 11*  ALKPHOS 60 46 53  BILITOT 1.2 1.0 1.3*  PROT 7.6 6.2* 6.3*  ALBUMIN 3.6 2.9* 2.9*    Recent Labs Lab 05/23/16 1312  LIPASE 16   No results for input(s): AMMONIA in the last 168 hours. Coagulation Profile: No results for input(s): INR, PROTIME in the last 168 hours. Cardiac Enzymes:  Recent Labs Lab 05/20/16 0750  TROPONINI <0.03   BNP (last 3 results) No  results for input(s): PROBNP in the last 8760 hours. HbA1C: No results for input(s): HGBA1C in the last 72 hours. CBG:  Recent Labs Lab 05/20/16 0816  GLUCAP 85   Lipid Profile: No results for input(s): CHOL, HDL, LDLCALC, TRIG, CHOLHDL, LDLDIRECT in the last 72 hours. Thyroid Function Tests: No results for input(s): TSH, T4TOTAL, FREET4, T3FREE, THYROIDAB in the last 72 hours. Anemia Panel:  Recent Labs  05/20/16 2027  VITAMINB12 424  FOLATE 8.0  FERRITIN 170  TIBC 165*  IRON 68  RETICCTPCT <0.4*    Sepsis Labs: No results for input(s): PROCALCITON, LATICACIDVEN in the last 168 hours.  No results found for this or any previous visit (from the past 240 hour(s)).    Radiology Studies: No results found.   Scheduled Meds: . enoxaparin (LOVENOX) injection  40 mg Subcutaneous Q24H  . [START ON 05/24/2016] feeding supplement (ENSURE ENLIVE)  237 mL Oral TID BM  . pantoprazole  40 mg Oral QAC breakfast   Continuous Infusions: . lactated ringers    .  lactated ringers    Time spent: 35 minutes.  Velvet Bathe, MD [facility] Pager [phone]  If 7PM-7AM, please contact night-coverage [email or web address] Password [facility] 05/23/2016, 4:38 PM

## 2016-05-23 NOTE — Progress Notes (Signed)
[facility] Progress Note  2 Days Post-Op  Subjective: Sitting up in bed, NAD. Pain controlled. Ambulating. Large BM yesterday after suppository. Still having nausea - 3 episodes of vomiting yesterday and one this AM. States she had grits the morning before vomiting. States that her Boost shakes recommended by nutrition are "too sweet" and so she has not been drinking them. Denies dizziness, weakness, hematemesis, or melena/hematochezia.  Objective: Vital signs in last 24 hours: Temp:  [97.3 F (36.3 C)-99.8 F (37.7 C)] 98.7 F (37.1 C) (06/08 0904) Pulse Rate:  [72-109] 96 (06/08 0904) Resp:  [17-19] 18 (06/08 0904) BP: (103-142)/(68-82) 103/68 mmHg (06/08 0904) SpO2:  [95 %-100 %] 99 % (06/08 0904) Weight:  [60.011 kg (132 lb 4.8 oz)] 60.011 kg (132 lb 4.8 oz) (06/07 2108) Last BM Date: 05/23/16  Intake/Output from previous day: 06/07 0701 - 06/08 0700 In: 680 [P.O.:380; I.V.:300] Out: 0  Intake/Output this shift: Total I/O In: 120 [P.O.:120] Out: -   PE: Gen:  Alert, NAD, pleasant Card:  RRR, no M/G/R heard Pulm:  CTA, no W/R/R Abd: Soft, appropriately tender, ND, +BS, ncisions C/D/I Ext:  No erythema, edema, or tenderness  Lab Results:   Recent Labs  05/21/16 0734 05/22/16 0706  WBC 4.5 5.9  HGB 9.7* 8.7*  HCT 30.6* 27.7*  PLT 188 170   BMET  Recent Labs  05/21/16 1626 05/22/16 0706  NA 139 140  K 3.0* 3.5  CL 104 103  CO2 23 24  GLUCOSE 75 75  BUN <5* <5*  CREATININE 1.10* 1.14*  CALCIUM 8.6* 8.5*   PT/INR No results for input(s): LABPROT, INR in the last 72 hours. CMP     Component Value Date/Time   NA 140 05/22/2016 0706   K 3.5 05/22/2016 0706   CL 103 05/22/2016 0706   CO2 24 05/22/2016 0706   GLUCOSE 75 05/22/2016 0706   BUN <5* 05/22/2016 0706   CREATININE 1.14* 05/22/2016 0706   CREATININE 1.18* 02/26/2016 1157   CALCIUM 8.5* 05/22/2016 0706   PROT 6.2* 05/22/2016 0706   ALBUMIN 2.9* 05/22/2016 0706   AST 54*  05/22/2016 0706   ALT 11* 05/22/2016 0706   ALKPHOS 46 05/22/2016 0706   BILITOT 1.0 05/22/2016 0706   GFRNONAA 50* 05/22/2016 0706   GFRAA 58* 05/22/2016 0706   Lipase     Component Value Date/Time   LIPASE 23 05/12/2016 0810    Studies/Results: No results found.  Anti-infectives: Anti-infectives    Start     Dose/Rate Route Frequency Ordered Stop   05/21/16 1150  [MAR Hold]  ceFAZolin (ANCEF) IVPB 2g/100 mL premix     (MAR Hold since 05/21/16 1119)   2 g 200 mL/hr over 30 Minutes Intravenous To [facility] 05/21/16 0745 05/21/16 1217       Assessment/Plan POD#2 Laparoscopic cholecystectomy with IOC (05/21/16 by Dr. Brantley Stage)   abdominal pain/chronic cholecystitis       - CBC, CMET, and lipase ordered; will discuss need for HIDA with Dr. Brantley Stage ro r/o bile leak  - IVF, pain control, antiemetics (zofran, phenergan)  -IS        - patient will need GI follow-up (consult in hospital vs outpatient)        Constipation: resolved ducolax suppository FEN: full liquids; seen by nutrition and getting Boost Breeze po TID (250 kcal, 9 g protein per serving) ID: single prophylactic dose Ancef 05/21/16 DVT Proph: lovenox, SCD's Dispo: floor, ambulate  Near Syncope- suspect due to dehydration/hypotension;  neg cardiac enzymes and normal EKG Hypokalemia- resolved AKI HTN Microcytic anemia - trend CBC's  Ongoing nausea with or without food today.  She is sore from her surgery but I do not think she is overly tender.  We will order a HIDA just to be sure there is not a leak.  I would recommend you call GI again and ask them to see her also.  [id number]   [name] , [facility] 05/23/2016, 12:32 PM Pager: [phone] Mon-Fri 7:00 am-4:30 pm Sat-Sun 7:00 am-11:30 am

## 2016-05-23 NOTE — Progress Notes (Addendum)
Nutrition Follow-up  DOCUMENTATION CODES:   Severe malnutrition in context of chronic illness  INTERVENTION:  Discontinue Boost Breeze.  Once diet advances, provide Ensure Enlive po TID, each supplement provides 350 kcal and 20 grams of protein.  Monitor magnesium, potassium, and phosphorus daily for at least 3 days, MD to replete as needed, as pt is at risk for refeeding syndrome given severe malnutrition and prolonged very poor po intake.  RD to continue to monitor.   NUTRITION DIAGNOSIS:   Inadequate oral intake related to poor appetite as evidenced by per patient/family report; ongoing  GOAL:   Patient will meet greater than or equal to 90% of their needs; not met  MONITOR:   PO intake, Supplement acceptance, Diet advancement, Weight trends, Labs, I & O's  REASON FOR ASSESSMENT:   Malnutrition Screening Tool    ASSESSMENT:   60 y.o. female with a Past Medical History of HTN, HLD who presents with near syncopal episode likely secondary to irritation, orthostasis/vasovagal episode in the setting of abdominal pain from underlying cholelithiasis and hypokalemia, AKI likely from dehydration.  Pt reports having n/v after eating at meals. Meal completion has been </=25%. Pt unable to tolerate po. Plans for HIDA. Pt currently has Boost Breeze ordered and has not been consuming them. Pt reports they are "too sweet". Pt is agreeable to Ensure instead. Pt does report she has consumed Ensure in the past however has not had one more recently. RD to order. Nursing staff to provide once diet advances.   Pt with no observed significant fat or muscle mass loss.   Potassium low at 3.1.  Diet Order:  Diet NPO time specified  Skin:   (Incision on abdomen)  Last BM:  6/8  Height:   Ht Readings from Last 1 Encounters:  05/20/16 '5\' 4"'$  (1.626 m)    Weight:   Wt Readings from Last 1 Encounters:  05/22/16 132 lb 4.8 oz (60.011 kg)    Ideal Body Weight:  54.5 kg  BMI:  Body  mass index is 22.7 kg/(m^2).  Estimated Nutritional Needs:   Kcal:  1700-1850  Protein:  75-85 grams  Fluid:  1.7 - 1.9 L/day  EDUCATION NEEDS:   No education needs identified at this time  Corrin Parker, MS, RD, LDN Pager # [phone] After hours/ weekend pager # [phone]

## 2016-05-24 DIAGNOSIS — R55 Syncope and collapse: Secondary | ICD-10-CM | POA: Diagnosis not present

## 2016-05-24 LAB — BASIC METABOLIC PANEL
Anion gap: 11 (ref 5–15)
CHLORIDE: 104 mmol/L (ref 101–111)
CO2: 24 mmol/L (ref 22–32)
CREATININE: 1.17 mg/dL — AB (ref 0.44–1.00)
Calcium: 8.7 mg/dL — ABNORMAL LOW (ref 8.9–10.3)
GFR calc non Af Amer: 49 mL/min — ABNORMAL LOW (ref >60)
GFR, EST AFRICAN AMERICAN: 57 mL/min — AB (ref >60)
Glucose, Bld: 60 mg/dL — ABNORMAL LOW (ref 65–99)
Potassium: 2.9 mmol/L — ABNORMAL LOW (ref 3.5–5.1)
Sodium: 139 mmol/L (ref 135–145)

## 2016-05-24 MED ORDER — METOCLOPRAMIDE HCL 5 MG/ML IJ SOLN
5.0000 mg | Freq: Three times a day (TID) | INTRAMUSCULAR | Status: DC
  Administered 2016-05-24 – 2016-05-27 (×10): 5 mg via INTRAVENOUS
  Filled 2016-05-24 (×10): qty 2

## 2016-05-24 NOTE — Consult Note (Signed)
Reason for Consult: Persistent Nausea and Vomiting Referring Physician: [facility]  Pearletha Furl HPI: This is a 60 year old female with a PMH of HTN, hyperlipidemia, and persistent abdominal pain with nausea and vomiting.  She was initially evaluated by the teaching residents for her abdominal complaints.  A CT scan on 02/14/2016 was significant for a jejunitis and she was treated with antibiotics first, but when she failed to respond, she was treated with steroids for the presumption of Crohn's.  This intervention was not beneficial and she was referred to my office for further evaluation.  An enteroscopy was performed and biopsies were obtained, but there was no gross or histologic evidence of jejunitis.  Another attempts with a course of Augmentin was tried in hopes of treating an infectious jejunitis, but this was not beneficial.  A repeat CT scan was obtained on 04/04/2016 and the scan reported a resolution of the jejunitis.  At that juncture she was referred to [facility] for a second opinion.  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  I did not see her back in the office after that time.  Her near syncopal episode prompted her admission as well has her hypotension.  A repeat CT scan was performed and it was not revealing for gallbladder sludge and some evidence of biliary dyskinesia.  She underwent an uneventful lap chole, but this did not resolve her symptoms.  She continues to have nausea, vomiting, and abdominal pain.  Past Medical History  Diagnosis Date  . Hypertension   . Hyperlipidemia   . Abdominal pain     persistent    Past Surgical History  Procedure Laterality Date  . Colonoscopy    . Cholecystectomy N/A 05/21/2016    Procedure: LAPAROSCOPIC CHOLECYSTECTOMY;  Surgeon: Erroll Luna, MD;  Location: [facility] OR;  Service: General;  Laterality: N/A;    Family History  Problem Relation  Age of Onset  . Stroke Mother   . Hyperlipidemia Mother   . Diabetes Mother     Social History:  reports that she has never smoked. She has never used smokeless tobacco. She reports that she does not drink alcohol or use illicit drugs.  Allergies:  Allergies  Allergen Reactions  . Isovue [Iopamidol] Hives    Pt broke out in several facial hives, one on her back.  She will need full premeds in the future.  J Bohm No allergy demonstrated to  Orally ingested Iodinated agent. (Gastrographin CT Scan 05-12-16) Pt broke out in several facial hives, one on her back.  She will need full premeds in the future.  J Bohm    Medications:  Scheduled: . enoxaparin (LOVENOX) injection  40 mg Subcutaneous Q24H  . feeding supplement (ENSURE ENLIVE)  237 mL Oral TID BM  . metoCLOPramide (REGLAN) injection  5 mg Intravenous Q8H  . pantoprazole  40 mg Oral QAC breakfast  . potassium chloride  40 mEq Oral Once   Continuous: . lactated ringers    . lactated ringers      Results for orders placed or performed during the hospital encounter of 05/20/16 (from the past 24 hour(s))  Basic metabolic panel     Status: Abnormal   Collection Time: 05/24/16 12:14 AM  Result Value Ref Range   Sodium 139 135 - 145 mmol/L   Potassium 2.9 (L) 3.5 - 5.1 mmol/L   Chloride 104 101 - 111 mmol/L  CO2 24 22 - 32 mmol/L   Glucose, Bld 60 (L) 65 - 99 mg/dL   BUN <5 (L) 6 - 20 mg/dL   Creatinine, Ser 1.17 (H) 0.44 - 1.00 mg/dL   Calcium 8.7 (L) 8.9 - 10.3 mg/dL   GFR calc non Af Amer 49 (L) >60 mL/min   GFR calc Af Amer 57 (L) >60 mL/min   Anion gap 11 5 - 15     Nm Hepatobiliary Including Gb  05/23/2016  CLINICAL DATA:  Status post cholecystectomy on 05/21/2016, nausea/ vomiting, evaluate for bile leak EXAM: NUCLEAR MEDICINE HEPATOBILIARY IMAGING TECHNIQUE: Sequential images of the abdomen were obtained out to 60 minutes following intravenous administration of radiopharmaceutical. RADIOPHARMACEUTICALS:  5.2 mCi  Tc-57m  Choletec IV COMPARISON:  05/20/2016 FINDINGS: Normal hepatic excretion. Visualization of small bowel within 15 minutes. No evidence of bile leak. IMPRESSION: No evidence of bile leak. Electronically Signed   By: Julian Hy M.D.   On: 05/23/2016 17:49    ROS:  As stated above in the HPI otherwise negative.  Blood pressure 132/73, pulse 87, temperature 98 F (36.7 C), temperature source Oral, resp. rate 16, height 5\' 4"  (1.626 m), weight 61.78 kg (136 lb 3.2 oz), SpO2 97 %.    PE: Gen: NAD, Alert and Oriented HEENT:  [employer]/AT, EOMI Neck: Supple, no LAD Lungs: CTA Bilaterally CV: RRR without M/G/R ABM: Soft, NTND, +BS Ext: No C/C/E  Assessment/Plan: 1) Post-infectious gastroparesis.   This may be the issue.  No other overt clues to her symptoms with the current extensive work up.  She was evaluated by Dr. Roney Mans, at [facility] once, but the plan was to pursue further work up with a capsule endoscopy and colonoscopy.  Again, at the time of the evaluation she was feeling better, but it was felt that she could benefit from more evaluation.  I discussed with her about performing a capsule endoscopy in the hospital, but she declines at this time.  She knows that she cannot tolerate the prep for a colonoscopy and that was the reason she did not have the scheduled procedure at [place]: 1) Metoclopramide 5 mg IV q8 hours.  The dosing can be increased to 10 mg if necessary. 2) Continue with supportive treatment.  Shela Esses D 05/24/2016, 1:26 PM

## 2016-05-24 NOTE — Discharge Instructions (Signed)

## 2016-05-24 NOTE — Progress Notes (Signed)
PROGRESS NOTE  [name]  [id number] DOB: 1956-03-09  DOA: 05/20/2016 PCP: Aundria Mems, MD   Brief Narrative:  60 year old female with PMH of HTN, HLD presented to [facility] ED on 05/20/16 with near syncopal episode attributed to dehydration, orthostasis versus vasovagal in the setting of nausea, vomiting and abdominal pain, acute kidney injury and profound hypokalemia. Patient has been unwell since February 2017 with persistent RUQ/epigastric region abdominal pain, nausea, vomiting, unable to consistently keep food down, significant weight loss (30 pounds since February) without constipation or diarrhea. EGD by Dr. Benson Norway apparently unrevealing, supposed to have had a colonoscopy on 6/6 in [place], was scheduled for outpatient HIDA scan on day of admission and while in the waiting room, she developed generalized weakness, lightheadedness and nausea.   Assessment & Plan:   Principal Problem:   Near syncope Active Problems:   Hypertension   Microcytic anemia   Hyperlipidemia with target LDL less than 100   Abdominal pain   Acute kidney injury ([place])   Hypokalemia   Nausea and vomiting   AP (abdominal pain)   Acute hypokalemia   Calculus of gallbladder without cholecystitis without obstruction   Protein-calorie malnutrition, severe   Near syncope/syncope - Likely secondary to hypotension from dehydration. - Hydrated with IV fluids. Blood pressures have normalized. Asymptomatic of dizziness or lightheadedness. Troponin times one: Negative. Telemetry: Sinus rhythm without arrhythmias.  Abdominal pain/chronic cholecystitis - Has been having chronic abdominal pain, nausea, intermittent vomiting, decreased appetite and weight loss since February 2017. Has undergone extensive evaluation as outpatient. HIDA scan suggested chronic cholecystitis. Recent CT abdomen without evidence of acute cholecystitis. - Gen. surgery was consulted and patient underwent laparoscopic cholecystectomy  on 6/6. - improvement in pain. Patient still complaining of some emesis after trying to consume some soup. - Should nausea and emesis persist will consider gastric emptying scan and GI consult.  Nausea/emesis - I suspect gastric dysmotility problem. We'll consult GI and start patient on Reglan  Hypotension/dehydration - Resolved after IV fluids. Secondary to GI losses and poor oral intake.  Acute kidney injury - Creatinine 1.5 on admission. Held home lisinopril and HCTZ. Hydrated with IV fluids. Resolved.  Essential hypertension - Management as indicated above. Holding HCTZ , lisinopril and amlodipine. When necessary hydralazine  Hypokalemia - replace and reassess  Anemia, microcytic - Follow CBCs. Anemia panel suggests possible chronic disease versus iron deficiency. Colonoscopy to be rescheduled.   DVT prophylaxis: Lovenox Code Status: Full Family Communication: discussed with patient this morning prior to procedure. No family at bedside. Disposition Plan: DC home when medically stable.   Consultants:   CCS  GI  Procedures:   Laparoscopic cholecystectomy 6/6  Antimicrobials:   None    Subjective: Pt has no new complaints. Still having nausea and some emesis. Would like her diet advanced  Objective:  Filed Vitals:   05/23/16 2115 05/24/16 0520 05/24/16 0813 05/24/16 1647  BP: 113/72 141/78 132/73 113/61  Pulse: 92 89 87 83  Temp: 98.4 F (36.9 C) 98.5 F (36.9 C) 98 F (36.7 C) 98.7 F (37.1 C)  TempSrc: Oral Oral Oral Oral  Resp: 16 16 16 14   Height:      Weight:  61.78 kg (136 lb 3.2 oz)    SpO2: 98% 98% 97% 98%    Intake/Output Summary (Last 24 hours) at 05/24/16 1822 Last data filed at 05/24/16 1500  Gross per 24 hour  Intake    700 ml  Output      0 ml  Net    700 ml   Filed Weights   05/22/16 0528 05/22/16 2108 05/24/16 0520  Weight: 59.875 kg (132 lb) 60.011 kg (132 lb 4.8 oz) 61.78 kg (136 lb 3.2 oz)    Examination:  General  exam: Pleasant middle-aged female sitting up comfortably at edge of bed this morning. Respiratory system: Clear to auscultation. Respiratory effort normal. Cardiovascular system: S1 & S2 heard, RRR. No JVD, murmurs, rubs, gallops or clicks. No pedal edema. telemetry: Sinus rhythm.  Gastrointestinal system: Abdomen is nondistended, soft and mild epigastric/RUQ tenderness without peritoneal signs. No organomegaly or masses felt. Normal bowel sounds heard. Central nervous system: Alert and oriented. No focal neurological deficits. Extremities: Symmetric 5 x 5 power. Skin: No rashes, lesions or ulcers Psychiatry: Judgement and insight appear normal. Mood & affect appropriate.     Data Reviewed: I have personally reviewed following labs and imaging studies  CBC:  Recent Labs Lab 05/20/16 0750 05/20/16 0813 05/21/16 0734 05/22/16 0706 05/23/16 1312  WBC 5.8  --  4.5 5.9 5.9  NEUTROABS 2.4  --   --   --   --   HGB 10.6* 12.2 9.7* 8.7* 8.7*  HCT 31.5* 36.0 30.6* 27.7* 27.0*  MCV 66.9*  --  68.8* 68.9* 68.4*  PLT 210  --  188 170 [id number]*   Basic Metabolic Panel:  Recent Labs Lab 05/20/16 0750  05/21/16 0734 05/21/16 1626 05/22/16 0706 05/23/16 1312 05/24/16 0014  NA 134*  < > 139 139 140 137 139  K 2.4*  < > 2.8* 3.0* 3.5 3.1* 2.9*  CL 96*  < > 102 104 103 104 104  CO2 20*  --  23 23 24  21* 24  GLUCOSE 86  < > 67 75 75 76 60*  BUN 6  < > <5* <5* <5* <5* <5*  CREATININE 1.50*  < > 1.16* 1.10* 1.14* 1.24* 1.17*  CALCIUM 10.0  --  9.0 8.6* 8.5* 8.6* 8.7*  MG 1.7  --   --   --   --   --   --   < > = values in this interval not displayed. GFR: Estimated Creatinine Clearance: 43.1 mL/min (by C-G formula based on Cr of 1.17). Liver Function Tests:  Recent Labs Lab 05/20/16 0750 05/22/16 0706 05/23/16 1312  AST 25 54* 47*  ALT 7* 11* 11*  ALKPHOS 60 46 53  BILITOT 1.2 1.0 1.3*  PROT 7.6 6.2* 6.3*  ALBUMIN 3.6 2.9* 2.9*    Recent Labs Lab 05/23/16 1312  LIPASE 16    No results for input(s): AMMONIA in the last 168 hours. Coagulation Profile: No results for input(s): INR, PROTIME in the last 168 hours. Cardiac Enzymes:  Recent Labs Lab 05/20/16 0750  TROPONINI <0.03   BNP (last 3 results) No results for input(s): PROBNP in the last 8760 hours. HbA1C: No results for input(s): HGBA1C in the last 72 hours. CBG:  Recent Labs Lab 05/20/16 0816  GLUCAP 85   Lipid Profile: No results for input(s): CHOL, HDL, LDLCALC, TRIG, CHOLHDL, LDLDIRECT in the last 72 hours. Thyroid Function Tests: No results for input(s): TSH, T4TOTAL, FREET4, T3FREE, THYROIDAB in the last 72 hours. Anemia Panel: No results for input(s): VITAMINB12, FOLATE, FERRITIN, TIBC, IRON, RETICCTPCT in the last 72 hours.  Sepsis Labs: No results for input(s): PROCALCITON, LATICACIDVEN in the last 168 hours.  No results found for this or any previous visit (from the past 240 hour(s)).    Radiology Studies: Nm Hepatobiliary Including Gb  05/23/2016  CLINICAL DATA:  Status post cholecystectomy on 05/21/2016, nausea/ vomiting, evaluate for bile leak EXAM: NUCLEAR MEDICINE HEPATOBILIARY IMAGING TECHNIQUE: Sequential images of the abdomen were obtained out to 60 minutes following intravenous administration of radiopharmaceutical. RADIOPHARMACEUTICALS:  5.2 mCi Tc-7m  Choletec IV COMPARISON:  05/20/2016 FINDINGS: Normal hepatic excretion. Visualization of small bowel within 15 minutes. No evidence of bile leak. IMPRESSION: No evidence of bile leak. Electronically Signed   By: Julian Hy M.D.   On: 05/23/2016 17:49     Scheduled Meds: . enoxaparin (LOVENOX) injection  40 mg Subcutaneous Q24H  . feeding supplement (ENSURE ENLIVE)  237 mL Oral TID BM  . metoCLOPramide (REGLAN) injection  5 mg Intravenous Q8H  . pantoprazole  40 mg Oral QAC breakfast  . potassium chloride  40 mEq Oral Once   Continuous Infusions: . lactated ringers    . lactated ringers    Time spent: 35  minutes.  Velvet Bathe, MD [facility] Pager [phone]  If 7PM-7AM, please contact night-coverage [email or web address] Password [facility] 05/24/2016, 6:22 PM

## 2016-05-24 NOTE — Progress Notes (Signed)
Patient ID: [name], female   DOB: 03/31/1956, 60 y.o.   MRN: [id number]     [facility]      [address] [place], [place] [address]    Phone: [phone] FAX: [phone]     Subjective: N/v. Had a BM. k low.   Objective:  Vital signs:  Filed Vitals:   05/23/16 1841 05/23/16 2115 05/24/16 0520 05/24/16 0813  BP: 110/70 113/72 141/78 132/73  Pulse: 95 92 89 87  Temp: 98.5 F (36.9 C) 98.4 F (36.9 C) 98.5 F (36.9 C) 98 F (36.7 C)  TempSrc: Oral Oral Oral Oral  Resp: '8 16 16 16  '$ Height:      Weight:   61.78 kg (136 lb 3.2 oz)   SpO2: 98% 98% 98% 97%    Last BM Date: 05/23/16  Intake/Output   Yesterday:  06/08 0701 - 06/09 0700 In: 880 [P.O.:480; IV Piggyback:400] Out: 0  This shift: I/O last 3 completed shifts: In: 1120 [P.O.:720; IV Piggyback:400] Out: 0     Physical Exam: General: Pt awake/alert/oriented x4 in no acute distress Abdomen: Soft.  Nondistended.  Mildly tender at incisions only.  No evidence of peritonitis.  No incarcerated hernias.    Problem List:   Principal Problem:   Near syncope Active Problems:   Hypertension   Microcytic anemia   Hyperlipidemia with target LDL less than 100   Abdominal pain   Acute kidney injury ([place])   Hypokalemia   Nausea and vomiting   AP (abdominal pain)   Acute hypokalemia   Calculus of gallbladder without cholecystitis without obstruction   Protein-calorie malnutrition, severe    Results:   Labs: Results for orders placed or performed during the hospital encounter of 05/20/16 (from the past 48 hour(s))  CBC     Status: Abnormal   Collection Time: 05/23/16  1:12 PM  Result Value Ref Range   WBC 5.9 4.0 - 10.5 K/uL   RBC 3.95 3.87 - 5.11 MIL/uL   Hemoglobin 8.7 (L) 12.0 - 15.0 g/dL   HCT 27.0 (L) 36.0 - 46.0 %   MCV 68.4 (L) 78.0 - 100.0 fL   MCH 22.0 (L) 26.0 - 34.0 pg   MCHC 32.2 30.0 - 36.0 g/dL   RDW 16.0 (H) 11.5 - 15.5 %   Platelets 139 (L) 150 - 400 K/uL  Comprehensive metabolic panel     Status: Abnormal   Collection Time: 05/23/16  1:12 PM  Result Value Ref Range   Sodium 137 135 - 145 mmol/L   Potassium 3.1 (L) 3.5 - 5.1 mmol/L   Chloride 104 101 - 111 mmol/L   CO2 21 (L) 22 - 32 mmol/L   Glucose, Bld 76 65 - 99 mg/dL   BUN <5 (L) 6 - 20 mg/dL   Creatinine, Ser 1.24 (H) 0.44 - 1.00 mg/dL   Calcium 8.6 (L) 8.9 - 10.3 mg/dL   Total Protein 6.3 (L) 6.5 - 8.1 g/dL   Albumin 2.9 (L) 3.5 - 5.0 g/dL   AST 47 (H) 15 - 41 U/L   ALT 11 (L) 14 - 54 U/L   Alkaline Phosphatase 53 38 - 126 U/L   Total Bilirubin 1.3 (H) 0.3 - 1.2 mg/dL   GFR calc non Af Amer 46 (L) >60 mL/min   GFR calc Af Amer 53 (L) >60 mL/min    Comment: (NOTE) The eGFR has been calculated using the CKD EPI equation. This calculation has not been  validated in all clinical situations. eGFR's persistently <60 mL/min signify possible Chronic Kidney Disease.    Anion gap 12 5 - 15  Lipase, blood     Status: None   Collection Time: 05/23/16  1:12 PM  Result Value Ref Range   Lipase 16 11 - 51 U/L  Basic metabolic panel     Status: Abnormal   Collection Time: 05/24/16 12:14 AM  Result Value Ref Range   Sodium 139 135 - 145 mmol/L   Potassium 2.9 (L) 3.5 - 5.1 mmol/L   Chloride 104 101 - 111 mmol/L   CO2 24 22 - 32 mmol/L   Glucose, Bld 60 (L) 65 - 99 mg/dL   BUN <5 (L) 6 - 20 mg/dL   Creatinine, Ser 1.17 (H) 0.44 - 1.00 mg/dL   Calcium 8.7 (L) 8.9 - 10.3 mg/dL   GFR calc non Af Amer 49 (L) >60 mL/min   GFR calc Af Amer 57 (L) >60 mL/min    Comment: (NOTE) The eGFR has been calculated using the CKD EPI equation. This calculation has not been validated in all clinical situations. eGFR's persistently <60 mL/min signify possible Chronic Kidney Disease.    Anion gap 11 5 - 15    Imaging / Studies: Nm Hepatobiliary Including Gb  05/23/2016  CLINICAL DATA:  Status post cholecystectomy on 05/21/2016, nausea/ vomiting, evaluate for bile  leak EXAM: NUCLEAR MEDICINE HEPATOBILIARY IMAGING TECHNIQUE: Sequential images of the abdomen were obtained out to 60 minutes following intravenous administration of radiopharmaceutical. RADIOPHARMACEUTICALS:  5.2 mCi Tc-83m Choletec IV COMPARISON:  05/20/2016 FINDINGS: Normal hepatic excretion. Visualization of small bowel within 15 minutes. No evidence of bile leak. IMPRESSION: No evidence of bile leak. Electronically Signed   By: SJulian HyM.D.   On: 05/23/2016 17:49    Medications / Allergies:  Scheduled Meds: . enoxaparin (LOVENOX) injection  40 mg Subcutaneous Q24H  . feeding supplement (ENSURE ENLIVE)  237 mL Oral TID BM  . pantoprazole  40 mg Oral QAC breakfast  . potassium chloride  40 mEq Oral Once   Continuous Infusions: . lactated ringers    . lactated ringers     PRN Meds:.hydrALAZINE, morphine injection, ondansetron **OR** ondansetron (ZOFRAN) IV, oxyCODONE-acetaminophen, phenol, promethazine  Antibiotics: Anti-infectives    Start     Dose/Rate Route Frequency Ordered Stop   05/21/16 1150  [MAR Hold]  ceFAZolin (ANCEF) IVPB 2g/100 mL premix     (MAR Hold since 05/21/16 1119)   2 g 200 mL/hr over 30 Minutes Intravenous To [facility] 05/21/16 0745 05/21/16 1217        Assessment/Plan Cholelithiasis POD#3 laparoscopic cholecystectomy with IOC---Dr. CBrantley Stage -abdomen is soft, having bowel function.  Etiology of persistent n/v is unclear.  Biliary source now excluded. HIDA negative for a leak.  Would recommend a GI consult for further evaluation.   Please call for further assistance.    EErby Pian ANorth[facility] Dba [facility] CenterSurgery Pager [phone] For consults and floor pages call [phone]([id number]-[phone])  05/24/2016 11:34 AM

## 2016-05-25 DIAGNOSIS — R55 Syncope and collapse: Secondary | ICD-10-CM | POA: Diagnosis not present

## 2016-05-25 LAB — BASIC METABOLIC PANEL
ANION GAP: 12 (ref 5–15)
CHLORIDE: 102 mmol/L (ref 101–111)
CO2: 27 mmol/L (ref 22–32)
Calcium: 8.8 mg/dL — ABNORMAL LOW (ref 8.9–10.3)
Creatinine, Ser: 0.99 mg/dL (ref 0.44–1.00)
GFR, EST NON AFRICAN AMERICAN: 60 mL/min — AB (ref >60)
Glucose, Bld: 72 mg/dL (ref 65–99)
POTASSIUM: 3 mmol/L — AB (ref 3.5–5.1)
SODIUM: 141 mmol/L (ref 135–145)

## 2016-05-25 NOTE — Progress Notes (Signed)
Subjective: Chart reviewed; patient examined. Patient seems to be doing fairly well today. The nausea and vomiting she had has improved. She has some post-op abdominal pain but claims she is feeling better overall.     Objective: Vital signs in last 24 hours: Temp:  [98.5 F (36.9 C)-98.7 F (37.1 C)] 98.5 F (36.9 C) (06/10 0758) Pulse Rate:  [80-99] 99 (06/10 0758) Resp:  [14-16] 16 (06/10 0758) BP: (113-139)/(61-88) 139/84 mmHg (06/10 0758) SpO2:  [96 %-100 %] 96 % (06/10 0758) Weight:  [59.376 kg (130 lb 14.4 oz)] 59.376 kg (130 lb 14.4 oz) (06/10 0547) Last BM Date: 05/24/16  Intake/Output from previous day: 06/09 0701 - 06/10 0700 In: 110 [P.O.:110] Out: 0  Intake/Output this shift:  General appearance: alert, cooperative, appears stated age, no distress and mildly obese Resp: clear to auscultation bilaterally Cardio: regular rate and rhythm, S1, S2 normal, no murmur, click, rub or gallop GI: soft, non-tender; bowel sounds normal; no masses,  no organomegaly Extremities: extremities normal, atraumatic, no cyanosis or edema  Lab Results:  Recent Labs  05/23/16 1312  WBC 5.9  HGB 8.7*  HCT 27.0*  PLT 139*   BMET  Recent Labs  05/23/16 1312 05/24/16 0014 05/25/16 0503  NA 137 139 141  K 3.1* 2.9* 3.0*  CL 104 104 102  CO2 21* 24 27  GLUCOSE 76 60* 72  BUN <5* <5* <5*  CREATININE 1.24* 1.17* 0.99  CALCIUM 8.6* 8.7* 8.8*   LFT  Recent Labs  05/23/16 1312  PROT 6.3*  ALBUMIN 2.9*  AST 47*  ALT 11*  ALKPHOS 53  BILITOT 1.3*  Studies/Results: Nm Hepatobiliary Including Gb  05/23/2016  CLINICAL DATA:  Status post cholecystectomy on 05/21/2016, nausea/ vomiting, evaluate for bile leak EXAM: NUCLEAR MEDICINE HEPATOBILIARY IMAGING TECHNIQUE: Sequential images of the abdomen were obtained out to 60 minutes following intravenous administration of radiopharmaceutical. RADIOPHARMACEUTICALS:  5.2 mCi Tc-25m  Choletec IV COMPARISON:  05/20/2016 FINDINGS: Normal  hepatic excretion. Visualization of small bowel within 15 minutes. No evidence of bile leak. IMPRESSION: No evidence of bile leak. Electronically Signed   By: Julian Hy M.D.   On: 05/23/2016 17:49   Medications: I have reviewed the patient's current medications.  Assessment/Plan: 1) Gastroparesis on Reglan. Limit the use of narcotics. 2) Abnormal LFT's-monitor closely. 3) PCM-Albumin 2.9 4) Hypokalemia-being supplemented.     [name] 05/25/2016, 9:01 AM

## 2016-05-25 NOTE — Progress Notes (Signed)
PROGRESS NOTE  [name]  [id number] DOB: 1956-05-04  DOA: 05/20/2016 PCP: Aundria Mems, MD   Brief Narrative:  60 year old female with PMH of HTN, HLD presented to [facility] ED on 05/20/16 with near syncopal episode attributed to dehydration, orthostasis versus vasovagal in the setting of nausea, vomiting and abdominal pain, acute kidney injury and profound hypokalemia. Patient has been unwell since February 2017 with persistent RUQ/epigastric region abdominal pain, nausea, vomiting, unable to consistently keep food down, significant weight loss (30 pounds since February) without constipation or diarrhea. EGD by Dr. Benson Norway apparently unrevealing, supposed to have had a colonoscopy on 6/6 in [place], was scheduled for outpatient HIDA scan on day of admission and while in the waiting room, she developed generalized weakness, lightheadedness and nausea.   Assessment & Plan:   Principal Problem:   Near syncope Active Problems:   Hypertension   Microcytic anemia   Hyperlipidemia with target LDL less than 100   Abdominal pain   Acute kidney injury ([place])   Hypokalemia   Nausea and vomiting   AP (abdominal pain)   Acute hypokalemia   Calculus of gallbladder without cholecystitis without obstruction   Protein-calorie malnutrition, severe   Near syncope/syncope - Likely secondary to hypotension from dehydration. - Hydrated with IV fluids. Blood pressures have normalized. Asymptomatic of dizziness or lightheadedness. Troponin times one: Negative. Telemetry: Sinus rhythm without arrhythmias.  Abdominal pain/chronic cholecystitis - Has been having chronic abdominal pain, nausea, intermittent vomiting, decreased appetite and weight loss since February 2017. Has undergone extensive evaluation as outpatient. HIDA scan suggested chronic cholecystitis. Recent CT abdomen without evidence of acute cholecystitis. - Gen. surgery was consulted and patient underwent laparoscopic cholecystectomy  on 6/6. - improvement in pain. Patient still complaining of some emesis after trying to consume some soup. - GI consulted.  Nausea/emesis - I suspect gastric dysmotility problem.  - Gi  On board and on trial of Reglan.  Hypotension/dehydration - Resolved after IV fluids. Secondary to GI losses and poor oral intake.  Acute kidney injury - Creatinine 1.5 on admission. Held home lisinopril and HCTZ. Hydrated with IV fluids. Resolved.  Essential hypertension - Management as indicated above. Holding HCTZ , lisinopril and amlodipine. When necessary hydralazine  Hypokalemia - replace and reassess  Anemia, microcytic - Follow CBCs. Anemia panel suggests possible chronic disease versus iron deficiency. Colonoscopy to be rescheduled.   DVT prophylaxis: Lovenox Code Status: Full Family Communication: discussed with patient this morning prior to procedure. No family at bedside. Disposition Plan: DC home when medically stable.   Consultants:   CCS  GI  Procedures:   Laparoscopic cholecystectomy 6/6  Antimicrobials:   None    Subjective: Pt has no new complaints. Still having nausea and some emesis. Reports keeping her breakfast down.  Objective:  Filed Vitals:   05/24/16 1647 05/24/16 2004 05/25/16 0547 05/25/16 0758  BP: 113/61 122/63 132/88 139/84  Pulse: 83 80 92 99  Temp: 98.7 F (37.1 C) 98.6 F (37 C) 98.6 F (37 C) 98.5 F (36.9 C)  TempSrc: Oral Oral Oral Oral  Resp: 14 16 16 16   Height:      Weight:   59.376 kg (130 lb 14.4 oz)   SpO2: 98% 96% 100% 96%    Intake/Output Summary (Last 24 hours) at 05/25/16 1725 Last data filed at 05/25/16 1347  Gross per 24 hour  Intake    410 ml  Output      0 ml  Net    410 ml  Filed Weights   05/22/16 2108 05/24/16 0520 05/25/16 0547  Weight: 60.011 kg (132 lb 4.8 oz) 61.78 kg (136 lb 3.2 oz) 59.376 kg (130 lb 14.4 oz)    Examination:  General exam: Pleasant middle-aged female, awake and alert Respiratory  system: Clear to auscultation. Respiratory effort normal. Cardiovascular system: S1 & S2 heard, RRR. No JVD, murmurs, rubs, gallops or clicks. No pedal edema. telemetry: Sinus rhythm.  Gastrointestinal system: Abdomen is nondistended, soft and mild epigastric/RUQ tenderness without peritoneal signs. No organomegaly or masses felt. Normal bowel sounds heard. Central nervous system: Alert and oriented. No focal neurological deficits. Extremities: Symmetric 5 x 5 power. Skin: No rashes, lesions or ulcers Psychiatry: Judgement and insight appear normal. Mood & affect appropriate.     Data Reviewed: I have personally reviewed following labs and imaging studies  CBC:  Recent Labs Lab 05/20/16 0750 05/20/16 0813 05/21/16 0734 05/22/16 0706 05/23/16 1312  WBC 5.8  --  4.5 5.9 5.9  NEUTROABS 2.4  --   --   --   --   HGB 10.6* 12.2 9.7* 8.7* 8.7*  HCT 31.5* 36.0 30.6* 27.7* 27.0*  MCV 66.9*  --  68.8* 68.9* 68.4*  PLT 210  --  188 170 [id number]*   Basic Metabolic Panel:  Recent Labs Lab 05/20/16 0750  05/21/16 1626 05/22/16 0706 05/23/16 1312 05/24/16 0014 05/25/16 0503  NA 134*  < > 139 140 137 139 141  K 2.4*  < > 3.0* 3.5 3.1* 2.9* 3.0*  CL 96*  < > 104 103 104 104 102  CO2 20*  < > 23 24 21* 24 27  GLUCOSE 86  < > 75 75 76 60* 72  BUN 6  < > <5* <5* <5* <5* <5*  CREATININE 1.50*  < > 1.10* 1.14* 1.24* 1.17* 0.99  CALCIUM 10.0  < > 8.6* 8.5* 8.6* 8.7* 8.8*  MG 1.7  --   --   --   --   --   --   < > = values in this interval not displayed. GFR: Estimated Creatinine Clearance: 50.9 mL/min (by C-G formula based on Cr of 0.99). Liver Function Tests:  Recent Labs Lab 05/20/16 0750 05/22/16 0706 05/23/16 1312  AST 25 54* 47*  ALT 7* 11* 11*  ALKPHOS 60 46 53  BILITOT 1.2 1.0 1.3*  PROT 7.6 6.2* 6.3*  ALBUMIN 3.6 2.9* 2.9*    Recent Labs Lab 05/23/16 1312  LIPASE 16   No results for input(s): AMMONIA in the last 168 hours. Coagulation Profile: No results for  input(s): INR, PROTIME in the last 168 hours. Cardiac Enzymes:  Recent Labs Lab 05/20/16 0750  TROPONINI <0.03   BNP (last 3 results) No results for input(s): PROBNP in the last 8760 hours. HbA1C: No results for input(s): HGBA1C in the last 72 hours. CBG:  Recent Labs Lab 05/20/16 0816  GLUCAP 85   Lipid Profile: No results for input(s): CHOL, HDL, LDLCALC, TRIG, CHOLHDL, LDLDIRECT in the last 72 hours. Thyroid Function Tests: No results for input(s): TSH, T4TOTAL, FREET4, T3FREE, THYROIDAB in the last 72 hours. Anemia Panel: No results for input(s): VITAMINB12, FOLATE, FERRITIN, TIBC, IRON, RETICCTPCT in the last 72 hours.  Sepsis Labs: No results for input(s): PROCALCITON, LATICACIDVEN in the last 168 hours.  No results found for this or any previous visit (from the past 240 hour(s)).    Radiology Studies: No results found.   Scheduled Meds: . enoxaparin (LOVENOX) injection  40 mg Subcutaneous Q24H  .  feeding supplement (ENSURE ENLIVE)  237 mL Oral TID BM  . metoCLOPramide (REGLAN) injection  5 mg Intravenous Q8H  . pantoprazole  40 mg Oral QAC breakfast  . potassium chloride  40 mEq Oral Once   Continuous Infusions: . lactated ringers    . lactated ringers    Time spent: 35 minutes.  Velvet Bathe, MD [facility] Pager [phone]  If 7PM-7AM, please contact night-coverage [email or web address] Password [email or web address] 05/25/2016, 5:25 PM

## 2016-05-26 DIAGNOSIS — E785 Hyperlipidemia, unspecified: Secondary | ICD-10-CM | POA: Diagnosis present

## 2016-05-26 DIAGNOSIS — R109 Unspecified abdominal pain: Secondary | ICD-10-CM | POA: Diagnosis present

## 2016-05-26 DIAGNOSIS — R5381 Other malaise: Secondary | ICD-10-CM | POA: Diagnosis not present

## 2016-05-26 DIAGNOSIS — N179 Acute kidney failure, unspecified: Secondary | ICD-10-CM | POA: Diagnosis present

## 2016-05-26 DIAGNOSIS — Z7982 Long term (current) use of aspirin: Secondary | ICD-10-CM | POA: Diagnosis not present

## 2016-05-26 DIAGNOSIS — I10 Essential (primary) hypertension: Secondary | ICD-10-CM | POA: Diagnosis present

## 2016-05-26 DIAGNOSIS — I959 Hypotension, unspecified: Secondary | ICD-10-CM | POA: Diagnosis present

## 2016-05-26 DIAGNOSIS — D509 Iron deficiency anemia, unspecified: Secondary | ICD-10-CM | POA: Diagnosis present

## 2016-05-26 DIAGNOSIS — R55 Syncope and collapse: Secondary | ICD-10-CM | POA: Diagnosis not present

## 2016-05-26 DIAGNOSIS — E860 Dehydration: Secondary | ICD-10-CM | POA: Diagnosis present

## 2016-05-26 DIAGNOSIS — E43 Unspecified severe protein-calorie malnutrition: Secondary | ICD-10-CM | POA: Diagnosis present

## 2016-05-26 DIAGNOSIS — K219 Gastro-esophageal reflux disease without esophagitis: Secondary | ICD-10-CM | POA: Diagnosis present

## 2016-05-26 DIAGNOSIS — Z79899 Other long term (current) drug therapy: Secondary | ICD-10-CM | POA: Diagnosis not present

## 2016-05-26 DIAGNOSIS — K5900 Constipation, unspecified: Secondary | ICD-10-CM | POA: Diagnosis not present

## 2016-05-26 DIAGNOSIS — K8010 Calculus of gallbladder with chronic cholecystitis without obstruction: Secondary | ICD-10-CM | POA: Diagnosis present

## 2016-05-26 DIAGNOSIS — K3184 Gastroparesis: Secondary | ICD-10-CM | POA: Diagnosis present

## 2016-05-26 DIAGNOSIS — Z6821 Body mass index (BMI) 21.0-21.9, adult: Secondary | ICD-10-CM | POA: Diagnosis not present

## 2016-05-26 DIAGNOSIS — E876 Hypokalemia: Secondary | ICD-10-CM | POA: Diagnosis present

## 2016-05-26 LAB — MAGNESIUM: Magnesium: 1.6 mg/dL — ABNORMAL LOW (ref 1.7–2.4)

## 2016-05-26 MED ORDER — MAGNESIUM SULFATE IN D5W 1-5 GM/100ML-% IV SOLN
1.0000 g | Freq: Once | INTRAVENOUS | Status: AC
  Administered 2016-05-26: 1 g via INTRAVENOUS
  Filled 2016-05-26: qty 100

## 2016-05-26 MED ORDER — POTASSIUM CHLORIDE CRYS ER 20 MEQ PO TBCR
40.0000 meq | EXTENDED_RELEASE_TABLET | Freq: Once | ORAL | Status: AC
  Administered 2016-05-26: 40 meq via ORAL
  Filled 2016-05-26 (×2): qty 2

## 2016-05-26 NOTE — Progress Notes (Signed)
PROGRESS NOTE  Chi Crise  [id number] DOB: 05/14/1956  DOA: 05/20/2016 PCP: Aundria Mems, MD   Brief Narrative:  60 year old female with PMH of HTN, HLD presented to [facility] ED on 05/20/16 with near syncopal episode attributed to dehydration, orthostasis versus vasovagal in the setting of nausea, vomiting and abdominal pain, acute kidney injury and profound hypokalemia. Patient has been unwell since February 2017 with persistent RUQ/epigastric region abdominal pain, nausea, vomiting, unable to consistently keep food down, significant weight loss (30 pounds since February) without constipation or diarrhea. EGD by Dr. Benson Norway apparently unrevealing, supposed to have had a colonoscopy on 6/6 in [place], was scheduled for outpatient HIDA scan on day of admission and while in the waiting room, she developed generalized weakness, lightheadedness and nausea.   Assessment & Plan:   Principal Problem:   Near syncope Active Problems:   Hypertension   Microcytic anemia   Hyperlipidemia with target LDL less than 100   Abdominal pain   Acute kidney injury ([place])   Hypokalemia   Nausea and vomiting   AP (abdominal pain)   Acute hypokalemia   Calculus of gallbladder without cholecystitis without obstruction   Protein-calorie malnutrition, severe   Near syncope/syncope - Likely secondary to hypotension from dehydration. - Hydrated with IV fluids. resolved  Abdominal pain/chronic cholecystitis - Has been having chronic abdominal pain, nausea, intermittent vomiting, decreased appetite and weight loss since February 2017. Has undergone extensive evaluation as outpatient. HIDA scan suggested chronic cholecystitis. Recent CT abdomen without evidence of acute cholecystitis. - Gen. surgery was consulted and patient underwent laparoscopic cholecystectomy on 6/6. - improvement in pain. Patient still complaining of some emesis after trying to consume some soup. - GI consulted.  Nausea/emesis - I  suspect gastric dysmotility problem.  - Gi  On board and on trial of Reglan. - consulted dietitian for gastroparesis diet recommendation.  Hypotension/dehydration - Resolved after IV fluids. Secondary to GI losses and poor oral intake.  Acute kidney injury - Creatinine 1.5 on admission. Held home lisinopril and HCTZ. Hydrated with IV fluids. Resolved.  Essential hypertension - Management as indicated above. Holding HCTZ , lisinopril and amlodipine. When necessary hydralazine  Hypokalemia - replace with k dur 40 meq and reassess  Hypomagnesemia - replace 1 gm IV  Anemia, microcytic - Follow CBCs. Anemia panel suggests possible chronic disease versus iron deficiency. Colonoscopy to be rescheduled.   DVT prophylaxis: Lovenox Code Status: Full Family Communication: discussed with patient this morning prior to procedure. No family at bedside. Disposition Plan: DC home when medically stable.   Consultants:   CCS  GI  Procedures:   Laparoscopic cholecystectomy 6/6  Antimicrobials:   None    Subjective: Pt has no new complaints. Still having nausea but kept grits down. She reports she was unable to keep grits down.  Objective:  Filed Vitals:   05/25/16 1844 05/25/16 2111 05/26/16 0427 05/26/16 0920  BP: 136/70 140/90 130/89 142/79  Pulse: 85 88 83 88  Temp: 98.1 F (36.7 C) 98.5 F (36.9 C) 98.5 F (36.9 C) 98.6 F (37 C)  TempSrc: Oral Oral Oral Oral  Resp: 15 14 15 15   Height:      Weight:      SpO2: 95% 100% 100% 97%    Intake/Output Summary (Last 24 hours) at 05/26/16 1349 Last data filed at 05/25/16 1845  Gross per 24 hour  Intake    120 ml  Output      0 ml  Net    120 ml  Filed Weights   05/22/16 2108 05/24/16 0520 05/25/16 0547  Weight: 60.011 kg (132 lb 4.8 oz) 61.78 kg (136 lb 3.2 oz) 59.376 kg (130 lb 14.4 oz)    Examination:  General exam: Pleasant middle-aged female, awake and alert Respiratory system: Clear to auscultation.  Respiratory effort normal. Cardiovascular system: S1 & S2 heard, RRR. No JVD, murmurs, rubs, gallops or clicks. No pedal edema. telemetry: Sinus rhythm.  Gastrointestinal system: Abdomen is nondistended, soft and mild epigastric/RUQ tenderness without peritoneal signs. No organomegaly or masses felt. Normal bowel sounds heard. Central nervous system: Alert and oriented. No focal neurological deficits. Extremities: Symmetric 5 x 5 power. Skin: No rashes, lesions or ulcers Psychiatry: Judgement and insight appear normal. Mood & affect appropriate.     Data Reviewed: I have personally reviewed following labs and imaging studies  CBC:  Recent Labs Lab 05/20/16 0750 05/20/16 0813 05/21/16 0734 05/22/16 0706 05/23/16 1312  WBC 5.8  --  4.5 5.9 5.9  NEUTROABS 2.4  --   --   --   --   HGB 10.6* 12.2 9.7* 8.7* 8.7*  HCT 31.5* 36.0 30.6* 27.7* 27.0*  MCV 66.9*  --  68.8* 68.9* 68.4*  PLT 210  --  188 170 [id number]*   Basic Metabolic Panel:  Recent Labs Lab 05/20/16 0750  05/21/16 1626 05/22/16 0706 05/23/16 1312 05/24/16 0014 05/25/16 0503 05/26/16 0430  NA 134*  < > 139 140 137 139 141  --   K 2.4*  < > 3.0* 3.5 3.1* 2.9* 3.0*  --   CL 96*  < > 104 103 104 104 102  --   CO2 20*  < > 23 24 21* 24 27  --   GLUCOSE 86  < > 75 75 76 60* 72  --   BUN 6  < > <5* <5* <5* <5* <5*  --   CREATININE 1.50*  < > 1.10* 1.14* 1.24* 1.17* 0.99  --   CALCIUM 10.0  < > 8.6* 8.5* 8.6* 8.7* 8.8*  --   MG 1.7  --   --   --   --   --   --  1.6*  < > = values in this interval not displayed. GFR: Estimated Creatinine Clearance: 50.9 mL/min (by C-G formula based on Cr of 0.99). Liver Function Tests:  Recent Labs Lab 05/20/16 0750 05/22/16 0706 05/23/16 1312  AST 25 54* 47*  ALT 7* 11* 11*  ALKPHOS 60 46 53  BILITOT 1.2 1.0 1.3*  PROT 7.6 6.2* 6.3*  ALBUMIN 3.6 2.9* 2.9*    Recent Labs Lab 05/23/16 1312  LIPASE 16   No results for input(s): AMMONIA in the last 168 hours. Coagulation  Profile: No results for input(s): INR, PROTIME in the last 168 hours. Cardiac Enzymes:  Recent Labs Lab 05/20/16 0750  TROPONINI <0.03   BNP (last 3 results) No results for input(s): PROBNP in the last 8760 hours. HbA1C: No results for input(s): HGBA1C in the last 72 hours. CBG:  Recent Labs Lab 05/20/16 0816  GLUCAP 85   Lipid Profile: No results for input(s): CHOL, HDL, LDLCALC, TRIG, CHOLHDL, LDLDIRECT in the last 72 hours. Thyroid Function Tests: No results for input(s): TSH, T4TOTAL, FREET4, T3FREE, THYROIDAB in the last 72 hours. Anemia Panel: No results for input(s): VITAMINB12, FOLATE, FERRITIN, TIBC, IRON, RETICCTPCT in the last 72 hours.  Sepsis Labs: No results for input(s): PROCALCITON, LATICACIDVEN in the last 168 hours.  No results found for this or any previous  visit (from the past 240 hour(s)).    Radiology Studies: No results found.   Scheduled Meds: . enoxaparin (LOVENOX) injection  40 mg Subcutaneous Q24H  . feeding supplement (ENSURE ENLIVE)  237 mL Oral TID BM  . metoCLOPramide (REGLAN) injection  5 mg Intravenous Q8H  . pantoprazole  40 mg Oral QAC breakfast  . potassium chloride  40 mEq Oral Once   Continuous Infusions: . lactated ringers    . lactated ringers    Time spent: 35 minutes.  Velvet Bathe, MD [facility] Pager [phone]  If 7PM-7AM, please contact night-coverage [email or web address] Password [email or web address] 05/26/2016, 1:49 PM

## 2016-05-26 NOTE — Progress Notes (Addendum)
Subjective: Patient has been having some nausea and vomited a couple of times yesterday . Kept her grits down this morning. She denies having any nausea or abdominal pain at this time.   Objective: Vital signs in last 24 hours: Temp:  [98.1 F (36.7 C)-98.5 F (36.9 C)] 98.5 F (36.9 C) (06/11 0427) Pulse Rate:  [83-88] 83 (06/11 0427) Resp:  [14-15] 15 (06/11 0427) BP: (130-140)/(70-90) 130/89 mmHg (06/11 0427) SpO2:  [95 %-100 %] 100 % (06/11 0427) Last BM Date: 05/24/16  Intake/Output from previous day: 06/10 0701 - 06/11 0700 In: 480 [P.O.:480] Out: 0  Intake/Output this shift:   General appearance: alert, cooperative, appears stated age, fatigued, no distress and pale Resp: clear to auscultation bilaterally Cardio: regular rate and rhythm, S1, S2 normal, no murmur, click, rub or gallop GI: soft, non-tender; bowel sounds normal; no masses,  no organomegaly Extremities: extremities normal, atraumatic, no cyanosis or edema  Lab Results:  Recent Labs  05/23/16 1312  WBC 5.9  HGB 8.7*  HCT 27.0*  PLT 139*   BMET  Recent Labs  05/23/16 1312 05/24/16 0014 05/25/16 0503  NA 137 139 141  K 3.1* 2.9* 3.0*  CL 104 104 102  CO2 21* 24 27  GLUCOSE 76 60* 72  BUN <5* <5* <5*  CREATININE 1.24* 1.17* 0.99  CALCIUM 8.6* 8.7* 8.8*   LFT  Recent Labs  05/23/16 1312  PROT 6.3*  ALBUMIN 2.9*  AST 47*  ALT 11*  ALKPHOS 53  BILITOT 1.3*   Medications: I have reviewed the patient's current medications.  Assessment/Plan:  1) Gastroparesis: she should receive Reglan 20 minutes before meals and at bedtime. She also needs to follow a gastroparesis diet. A nutrition consult may be helpful. 2) Microcytic anemia-needs to be worked up-she has not had a colonoscopy as she was not able to tolerate a prep.  3) Hypokalemia-needs correction. 4) Malnutrtion.  5) S/P Laparoscopic cholecystectomy.  Pallavi Clifton 05/26/2016, 9:09 AM

## 2016-05-27 LAB — BASIC METABOLIC PANEL
Anion gap: 9 (ref 5–15)
BUN: <5 mg/dL — ABNORMAL LOW (ref 6–20)
CALCIUM: 9.2 mg/dL (ref 8.9–10.3)
CHLORIDE: 100 mmol/L — AB (ref 101–111)
CO2: 30 mmol/L (ref 22–32)
CREATININE: 0.92 mg/dL (ref 0.44–1.00)
GFR calc Af Amer: >60 mL/min (ref >60)
GFR calc non Af Amer: >60 mL/min (ref >60)
GLUCOSE: 91 mg/dL (ref 65–99)
Potassium: 2.7 mmol/L — CL (ref 3.5–5.1)
Sodium: 139 mmol/L (ref 135–145)

## 2016-05-27 MED ORDER — POTASSIUM CHLORIDE 20 MEQ/15ML (10%) PO SOLN
40.0000 meq | Freq: Once | ORAL | Status: AC
  Administered 2016-05-27: 40 meq via ORAL
  Filled 2016-05-27: qty 30

## 2016-05-27 MED ORDER — METOCLOPRAMIDE HCL 5 MG PO TABS
5.0000 mg | ORAL_TABLET | Freq: Three times a day (TID) | ORAL | Status: DC
  Administered 2016-05-27 – 2016-05-30 (×12): 5 mg via ORAL
  Filled 2016-05-27 (×11): qty 1

## 2016-05-27 NOTE — Progress Notes (Signed)
Subjective: Weak, but feeling well at this time.  Objective: Vital signs in last 24 hours: Temp:  [98.4 F (36.9 C)-98.6 F (37 C)] 98.4 F (36.9 C) (06/12 0450) Pulse Rate:  [81-94] 84 (06/12 0450) Resp:  [15-18] 18 (06/12 0450) BP: (115-142)/(73-83) 129/83 mmHg (06/12 0450) SpO2:  [97 %-100 %] 99 % (06/12 0450) Weight:  [61.326 kg (135 lb 3.2 oz)] 61.326 kg (135 lb 3.2 oz) (06/11 2133) Last BM Date: 05/24/16  Intake/Output from previous day: 06/11 0701 - 06/12 0700 In: 120 [P.O.:120] Out: 0  Intake/Output this shift:    General appearance: alert and no distress GI: soft, non-tender; bowel sounds normal; no masses,  no organomegaly  Lab Results: No results for input(s): WBC, HGB, HCT, PLT in the last 72 hours. BMET  Recent Labs  05/25/16 0503 05/27/16 0446  NA 141 139  K 3.0* 2.7*  CL 102 100*  CO2 27 30  GLUCOSE 72 91  BUN <5* <5*  CREATININE 0.99 0.92  CALCIUM 8.8* 9.2   LFT No results for input(s): PROT, ALBUMIN, AST, ALT, ALKPHOS, BILITOT, BILIDIR, IBILI in the last 72 hours. PT/INR No results for input(s): LABPROT, INR in the last 72 hours. Hepatitis Panel No results for input(s): HEPBSAG, HCVAB, HEPAIGM, HEPBIGM in the last 72 hours. C-Diff No results for input(s): CDIFFTOX in the last 72 hours. Fecal Lactopherrin No results for input(s): FECLLACTOFRN in the last 72 hours.  Studies/Results: No results found.  Medications:  Scheduled: . enoxaparin (LOVENOX) injection  40 mg Subcutaneous Q24H  . feeding supplement (ENSURE ENLIVE)  237 mL Oral TID BM  . metoCLOPramide (REGLAN) injection  5 mg Intravenous Q8H  . pantoprazole  40 mg Oral QAC breakfast  . potassium chloride  40 mEq Oral Once   Continuous: . lactated ringers    . 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 2) Nausea/Vomiting.   She is better and she feels that the use of Reglan has helped.  Nausea is better controlled and she denies any  significant vomiting.  Overall she feels very weak and taking a shower tired her out immensely yesterday.  Plan: 1) Continue with Reglan. 2) Gastroparesis diet. 3) I will have her follow up in the office in 2 weeks.   LOS: 1 day   Erol Flanagin D 05/27/2016, 7:52 AM

## 2016-05-27 NOTE — Progress Notes (Signed)
Nutrition Follow-up  DOCUMENTATION CODES:   Severe malnutrition in context of chronic illness  INTERVENTION:   -Continue Ensure Enlive po TID, each supplement provides 350 kcal and 20 grams of protein -Educated on gastroparesis diet; "Gastroparesis Nutrition Therapy" handout from [facility] Nutrition Care Manual provided  NUTRITION DIAGNOSIS:   Inadequate oral intake related to poor appetite as evidenced by per patient/family report.  Progressing  GOAL:   Patient will meet greater than or equal to 90% of their needs  Progressing  MONITOR:   PO intake, Supplement acceptance, Diet advancement, Weight trends, Labs, I & O's  REASON FOR ASSESSMENT:   Consult Diet education  ASSESSMENT:   60 y.o. female with a Past Medical History of HTN, HLD who presents with near syncopal episode likely secondary to irritation, orthostasis/vasovagal episode in the setting of abdominal pain from underlying cholelithiasis and hypokalemia, AKI likely from dehydration.  RD consulted for education on diet for gastroparesis.   Spoke with pt at bedside. She reports feeling slightly better, however, continues to complain about nausea and vomiting with eating. Meal completion 50%, per doc flowsheets. Pt reports she feels encouraged, because she was able to tolerate grits and butter without difficulty. Pt did not attempt her lunch of mashed potatoes, banana, and chopped hamburger patty ("it's too thick for me"). She confirms that she is consuming her Ensure supplements.   Provided pt "Gastroparesis Nutrition Therapy" handout from [facility] Nutrition Care Manual. Discussed importance of consuming small, frequent meals (4-6 times daily) to stimulate gastric emptying. Educated pt on foods to avoids, including high fat foods and high fiber foods. Provided specific examples for pt to incorporate low fiber foods and high protein liquids into diet to optimize nutrition. Teachback method used. Expect fair compliance, as pt  voiced desire to continue to consume high fiber foods (such as raw carrots and peaches) that she "had a taste for" despite education. RD contact information provided for additional support.   Labs reviewed: K: 2.7, Mg: 1.6.   Diet Order:  DIET DYS 3 Room service appropriate?: Yes; Fluid consistency:: Thin  Skin:   (Incision on abdomen)  Last BM:  05/24/16  Height:   Ht Readings from Last 1 Encounters:  05/20/16 5\' 4"  (1.626 m)    Weight:   Wt Readings from Last 1 Encounters:  05/26/16 135 lb 3.2 oz (61.326 kg)    Ideal Body Weight:  54.5 kg  BMI:  Body mass index is 23.2 kg/(m^2).  Estimated Nutritional Needs:   Kcal:  1700-1850  Protein:  75-85 grams  Fluid:  1.7 - 1.9 L/day  EDUCATION NEEDS:   Education needs addressed  Adiva Boettner A. Jimmye Norman, RD, LDN, CDE Pager: [phone] After hours Pager: [phone]

## 2016-05-27 NOTE — Progress Notes (Signed)
PROGRESS NOTE  [name]  [id number] DOB: 1956-06-06  DOA: 05/20/2016 PCP: Aundria Mems, MD   Brief Narrative:  61 year old female with PMH of HTN, HLD presented to [facility] ED on 05/20/16 with near syncopal episode attributed to dehydration, orthostasis versus vasovagal in the setting of nausea, vomiting and abdominal pain, acute kidney injury and profound hypokalemia. Patient has been unwell since February 2017 with persistent RUQ/epigastric region abdominal pain, nausea, vomiting, unable to consistently keep food down, significant weight loss (30 pounds since February) without constipation or diarrhea. EGD by Dr. Benson Norway apparently unrevealing, supposed to have had a colonoscopy on 6/6 in [place], was scheduled for outpatient HIDA scan on day of admission and while in the waiting room, she developed generalized weakness, lightheadedness and nausea.   Assessment & Plan:   Principal Problem:   Near syncope Active Problems:   Hypertension   Microcytic anemia   Hyperlipidemia with target LDL less than 100   Abdominal pain   Acute kidney injury ([place])   Hypokalemia   Nausea and vomiting   AP (abdominal pain)   Acute hypokalemia   Calculus of gallbladder without cholecystitis without obstruction   Protein-calorie malnutrition, severe   Near syncope/syncope - Likely secondary to hypotension from dehydration. - Hydrated with IV fluids. resolved  Abdominal pain/chronic cholecystitis - Has been having chronic abdominal pain, nausea, intermittent vomiting, decreased appetite and weight loss since February 2017. Has undergone extensive evaluation as outpatient. HIDA scan suggested chronic cholecystitis. Recent CT abdomen without evidence of acute cholecystitis. - Gen. surgery was consulted and patient underwent laparoscopic cholecystectomy on 6/6. - improvement in pain. Patient still complaining of some emesis after trying to consume some soup. - GI consulted. -  Pt reports still  having problems keeping food down but the reglan has helped Nausea/emesis - I suspect gastric dysmotility problem.  - Gi  On board and on trial of Reglan. Will switch reglan to po and increase frequency - consulted dietitian for gastroparesis diet recommendation.  Hypotension/dehydration - Resolved after IV fluids. Secondary to GI losses and poor oral intake.  Acute kidney injury - Creatinine 1.5 on admission. Held home lisinopril and HCTZ. Hydrated with IV fluids. Resolved.  Essential hypertension - Management as indicated above. Holding HCTZ , lisinopril and amlodipine. When necessary hydralazine  Hypokalemia - replace with k dur 40 meq and reassess  Hypomagnesemia - replace 1 gm IV  Anemia, microcytic - Follow CBCs. Anemia panel suggests possible chronic disease versus iron deficiency. Colonoscopy to be rescheduled.   DVT prophylaxis: Lovenox Code Status: Full Family Communication: discussed with patient this morning prior to procedure. No family at bedside. Disposition Plan: DC home when medically stable.   Consultants:   CCS  GI  Procedures:   Laparoscopic cholecystectomy 6/6  Antimicrobials:   None    Subjective: Pt reports still having problems keeping food down.  Objective:  Filed Vitals:   05/26/16 1741 05/26/16 2133 05/27/16 0450 05/27/16 0951  BP: 115/73 125/79 129/83 105/81  Pulse: 94 81 84 97  Temp: 98.6 F (37 C) 98.4 F (36.9 C) 98.4 F (36.9 C) 97.4 F (36.3 C)  TempSrc: Oral Oral Oral Oral  Resp: 16 16 18 18   Height:      Weight:  61.326 kg (135 lb 3.2 oz)    SpO2: 100% 100% 99% 99%    Intake/Output Summary (Last 24 hours) at 05/27/16 1539 Last data filed at 05/27/16 1457  Gross per 24 hour  Intake    360 ml  Output  0 ml  Net    360 ml   Filed Weights   05/24/16 0520 05/25/16 0547 05/26/16 2133  Weight: 61.78 kg (136 lb 3.2 oz) 59.376 kg (130 lb 14.4 oz) 61.326 kg (135 lb 3.2 oz)    Examination:  General exam:  Pleasant middle-aged female, awake and alert Respiratory system: Clear to auscultation. Respiratory effort normal. Cardiovascular system: S1 & S2 heard, RRR. No JVD, murmurs, rubs, gallops or clicks. No pedal edema. telemetry: Sinus rhythm.  Gastrointestinal system: Abdomen is nondistended, soft and mild epigastric/RUQ tenderness without peritoneal signs. No organomegaly or masses felt. Normal bowel sounds heard. Central nervous system: Alert and oriented. No focal neurological deficits. Extremities: Symmetric 5 x 5 power. Skin: No rashes, lesions or ulcers Psychiatry: Judgement and insight appear normal. Mood & affect appropriate.     Data Reviewed: I have personally reviewed following labs and imaging studies  CBC:  Recent Labs Lab 05/21/16 0734 05/22/16 0706 05/23/16 1312  WBC 4.5 5.9 5.9  HGB 9.7* 8.7* 8.7*  HCT 30.6* 27.7* 27.0*  MCV 68.8* 68.9* 68.4*  PLT 188 170 [id number]*   Basic Metabolic Panel:  Recent Labs Lab 05/22/16 0706 05/23/16 1312 05/24/16 0014 05/25/16 0503 05/26/16 0430 05/27/16 0446  NA 140 137 139 141  --  139  K 3.5 3.1* 2.9* 3.0*  --  2.7*  CL 103 104 104 102  --  100*  CO2 24 21* 24 27  --  30  GLUCOSE 75 76 60* 72  --  91  BUN <5* <5* <5* <5*  --  <5*  CREATININE 1.14* 1.24* 1.17* 0.99  --  0.92  CALCIUM 8.5* 8.6* 8.7* 8.8*  --  9.2  MG  --   --   --   --  1.6*  --    GFR: Estimated Creatinine Clearance: 54.7 mL/min (by C-G formula based on Cr of 0.92). Liver Function Tests:  Recent Labs Lab 05/22/16 0706 05/23/16 1312  AST 54* 47*  ALT 11* 11*  ALKPHOS 46 53  BILITOT 1.0 1.3*  PROT 6.2* 6.3*  ALBUMIN 2.9* 2.9*    Recent Labs Lab 05/23/16 1312  LIPASE 16   No results for input(s): AMMONIA in the last 168 hours. Coagulation Profile: No results for input(s): INR, PROTIME in the last 168 hours. Cardiac Enzymes: No results for input(s): CKTOTAL, CKMB, CKMBINDEX, TROPONINI in the last 168 hours. BNP (last 3 results) No results  for input(s): PROBNP in the last 8760 hours. HbA1C: No results for input(s): HGBA1C in the last 72 hours. CBG: No results for input(s): GLUCAP in the last 168 hours. Lipid Profile: No results for input(s): CHOL, HDL, LDLCALC, TRIG, CHOLHDL, LDLDIRECT in the last 72 hours. Thyroid Function Tests: No results for input(s): TSH, T4TOTAL, FREET4, T3FREE, THYROIDAB in the last 72 hours. Anemia Panel: No results for input(s): VITAMINB12, FOLATE, FERRITIN, TIBC, IRON, RETICCTPCT in the last 72 hours.  Sepsis Labs: No results for input(s): PROCALCITON, LATICACIDVEN in the last 168 hours.  No results found for this or any previous visit (from the past 240 hour(s)).    Radiology Studies: No results found.   Scheduled Meds: . enoxaparin (LOVENOX) injection  40 mg Subcutaneous Q24H  . feeding supplement (ENSURE ENLIVE)  237 mL Oral TID BM  . metoCLOPramide  5 mg Oral TID AC & HS  . pantoprazole  40 mg Oral QAC breakfast   Continuous Infusions: . lactated ringers    . lactated ringers    Time spent: 35  minutes.  Velvet Bathe, MD [facility] Pager [phone]  If 7PM-7AM, please contact night-coverage [email or web address] Password [facility] 05/27/2016, 3:39 PM

## 2016-05-28 LAB — BASIC METABOLIC PANEL
Anion gap: 6 (ref 5–15)
CALCIUM: 9.8 mg/dL (ref 8.9–10.3)
CO2: 30 mmol/L (ref 22–32)
CREATININE: 0.94 mg/dL (ref 0.44–1.00)
Chloride: 101 mmol/L (ref 101–111)
GFR calc Af Amer: >60 mL/min (ref >60)
GLUCOSE: 140 mg/dL — AB (ref 65–99)
Potassium: 2.8 mmol/L — ABNORMAL LOW (ref 3.5–5.1)
SODIUM: 137 mmol/L (ref 135–145)

## 2016-05-28 LAB — MAGNESIUM: MAGNESIUM: 1.7 mg/dL (ref 1.7–2.4)

## 2016-05-28 MED ORDER — POTASSIUM CHLORIDE 10 MEQ/100ML IV SOLN
10.0000 meq | INTRAVENOUS | Status: AC
  Administered 2016-05-28: 10 meq via INTRAVENOUS
  Filled 2016-05-28: qty 100

## 2016-05-28 MED ORDER — POTASSIUM CHLORIDE 10 MEQ/100ML IV SOLN
10.0000 meq | INTRAVENOUS | Status: DC
  Administered 2016-05-28: 10 meq via INTRAVENOUS
  Filled 2016-05-28: qty 100

## 2016-05-28 MED ORDER — POTASSIUM CHLORIDE CRYS ER 20 MEQ PO TBCR
40.0000 meq | EXTENDED_RELEASE_TABLET | Freq: Once | ORAL | Status: AC
  Administered 2016-05-28: 40 meq via ORAL
  Filled 2016-05-28: qty 2

## 2016-05-28 NOTE — Progress Notes (Signed)
PROGRESS NOTE  [name]  [id number] DOB: [birth year]  DOA: 05/20/2016 PCP: Aundria Mems, MD   Brief Narrative:  60 year old female with PMH of HTN, HLD presented to [facility] ED on 05/20/16 with near syncopal episode attributed to dehydration, orthostasis versus vasovagal in the setting of nausea, vomiting and abdominal pain, acute kidney injury and profound hypokalemia. Patient has been unwell since February 2017 with persistent RUQ/epigastric region abdominal pain, nausea, vomiting, unable to consistently keep food down, significant weight loss (30 pounds since February) without constipation or diarrhea. EGD by Dr. Benson Norway apparently unrevealing, supposed to have had a colonoscopy on 6/6 in [place], was scheduled for outpatient HIDA scan on day of admission and while in the waiting room, she developed generalized weakness, lightheadedness and nausea.   Assessment & Plan:   Principal Problem:   Near syncope Active Problems:   Hypertension   Microcytic anemia   Hyperlipidemia with target LDL less than 100   Abdominal pain   Acute kidney injury ([place])   Hypokalemia   Nausea and vomiting   AP (abdominal pain)   Acute hypokalemia   Calculus of gallbladder without cholecystitis without obstruction   Protein-calorie malnutrition, severe   Near syncope/syncope - Likely secondary to hypotension from dehydration. - Hydrated with IV fluids. resolved  Abdominal pain/chronic cholecystitis - Has been having chronic abdominal pain, nausea, intermittent vomiting, decreased appetite and weight loss since February 2017. Has undergone extensive evaluation as outpatient. HIDA scan suggested chronic cholecystitis. Recent CT abdomen without evidence of acute cholecystitis. - Gen. surgery was consulted and patient underwent laparoscopic cholecystectomy on 6/6. - Improvement in pain. Patient still complaining of some emesis after trying to consume some soup. - GI consulted.  Pt reports still  having problems keeping food down but the reglan has helped Nausea/emesis - I suspect gastric dysmotility problem.  - Gi on board and on trial of Reglan. Will switch reglan to po and increase frequency - Consulted dietitian for gastroparesis diet recommendation.  Hypotension/dehydration - Resolved after IV fluids. Secondary to GI losses and poor oral intake.  Acute kidney injury - Creatinine 1.5 on admission. Held home lisinopril and HCTZ. Hydrated with IV fluids. Resolved.  Essential hypertension - Management as indicated above. Holding HCTZ , lisinopril and amlodipine. When necessary hydralazine  Hypokalemia - replace IV and orally - reassess mg levels - reassess next am.  Hypomagnesemia - replace 1 gm IV  Anemia, microcytic - Follow CBCs. Anemia panel suggests possible chronic disease versus iron deficiency. Colonoscopy to be rescheduled.  DVT prophylaxis: Lovenox Code Status: Full Family Communication: discussed with patient this morning prior to procedure. No family at bedside. Disposition Plan: DC home when medically stable.   Consultants:   CCS  GI  Procedures:   Laparoscopic cholecystectomy 6/6  Antimicrobials:   None    Subjective: Pt has no new complaints  Objective:  Filed Vitals:   05/27/16 1726 05/27/16 2015 05/28/16 0439 05/28/16 0902  BP: 139/78 123/73 106/69 138/80  Pulse: 102 88 94 90  Temp: 98 F (36.7 C) 98.8 F (37.1 C) 98.9 F (37.2 C) 98.5 F (36.9 C)  TempSrc: Oral Oral Oral Oral  Resp: 18 17 16 16   Height:      Weight:  60.873 kg (134 lb 3.2 oz)    SpO2: 99% 99% 100% 100%    Intake/Output Summary (Last 24 hours) at 05/28/16 1421 Last data filed at 05/28/16 0904  Gross per 24 hour  Intake    720 ml  Output  0 ml  Net    720 ml   Filed Weights   05/25/16 0547 05/26/16 2133 05/27/16 2015  Weight: 59.376 kg (130 lb 14.4 oz) 61.326 kg (135 lb 3.2 oz) 60.873 kg (134 lb 3.2 oz)    Examination:  General exam:  Pleasant middle-aged female, awake and alert Respiratory system: Clear to auscultation. Respiratory effort normal. Cardiovascular system: S1 & S2 heard, RRR. No JVD, murmurs, rubs, gallops or clicks. No pedal edema. telemetry: Sinus rhythm.  Gastrointestinal system: Abdomen is nondistended, soft and mild epigastric/RUQ tenderness without peritoneal signs. No organomegaly or masses felt. Normal bowel sounds heard. Central nervous system: Alert and oriented. No focal neurological deficits. Extremities: Symmetric 5 x 5 power. Skin: No rashes, lesions or ulcers Psychiatry: Judgement and insight appear normal. Mood & affect appropriate.     Data Reviewed: I have personally reviewed following labs and imaging studies  CBC:  Recent Labs Lab 05/22/16 0706 05/23/16 1312  WBC 5.9 5.9  HGB 8.7* 8.7*  HCT 27.7* 27.0*  MCV 68.9* 68.4*  PLT 170 [id number]*   Basic Metabolic Panel:  Recent Labs Lab 05/23/16 1312 05/24/16 0014 05/25/16 0503 05/26/16 0430 05/27/16 0446 05/28/16 1259  NA 137 139 141  --  139 137  K 3.1* 2.9* 3.0*  --  2.7* 2.8*  CL 104 104 102  --  100* 101  CO2 21* 24 27  --  30 30  GLUCOSE 76 60* 72  --  91 140*  BUN <5* <5* <5*  --  <5* <5*  CREATININE 1.24* 1.17* 0.99  --  0.92 0.94  CALCIUM 8.6* 8.7* 8.8*  --  9.2 9.8  MG  --   --   --  1.6*  --   --    GFR: Estimated Creatinine Clearance: 53.6 mL/min (by C-G formula based on Cr of 0.94). Liver Function Tests:  Recent Labs Lab 05/22/16 0706 05/23/16 1312  AST 54* 47*  ALT 11* 11*  ALKPHOS 46 53  BILITOT 1.0 1.3*  PROT 6.2* 6.3*  ALBUMIN 2.9* 2.9*    Recent Labs Lab 05/23/16 1312  LIPASE 16   No results for input(s): AMMONIA in the last 168 hours. Coagulation Profile: No results for input(s): INR, PROTIME in the last 168 hours. Cardiac Enzymes: No results for input(s): CKTOTAL, CKMB, CKMBINDEX, TROPONINI in the last 168 hours. BNP (last 3 results) No results for input(s): PROBNP in the last 8760  hours. HbA1C: No results for input(s): HGBA1C in the last 72 hours. CBG: No results for input(s): GLUCAP in the last 168 hours. Lipid Profile: No results for input(s): CHOL, HDL, LDLCALC, TRIG, CHOLHDL, LDLDIRECT in the last 72 hours. Thyroid Function Tests: No results for input(s): TSH, T4TOTAL, FREET4, T3FREE, THYROIDAB in the last 72 hours. Anemia Panel: No results for input(s): VITAMINB12, FOLATE, FERRITIN, TIBC, IRON, RETICCTPCT in the last 72 hours.  Sepsis Labs: No results for input(s): PROCALCITON, LATICACIDVEN in the last 168 hours.  No results found for this or any previous visit (from the past 240 hour(s)).    Radiology Studies: No results found.   Scheduled Meds: . enoxaparin (LOVENOX) injection  40 mg Subcutaneous Q24H  . feeding supplement (ENSURE ENLIVE)  237 mL Oral TID BM  . metoCLOPramide  5 mg Oral TID AC & HS  . pantoprazole  40 mg Oral QAC breakfast  . potassium chloride  10 mEq Intravenous Q1 Hr x 3  . potassium chloride  40 mEq Oral Once   Continuous Infusions: .  lactated ringers    . lactated ringers    Time spent: 35 minutes.  Velvet Bathe, MD [facility] Pager [phone]  If 7PM-7AM, please contact night-coverage [email or web address] Password [email or web address] 05/28/2016, 2:21 PM

## 2016-05-29 LAB — BASIC METABOLIC PANEL
Anion gap: 5 (ref 5–15)
CALCIUM: 9.4 mg/dL (ref 8.9–10.3)
CO2: 30 mmol/L (ref 22–32)
CREATININE: 0.92 mg/dL (ref 0.44–1.00)
Chloride: 101 mmol/L (ref 101–111)
GFR calc Af Amer: >60 mL/min (ref >60)
Glucose, Bld: 119 mg/dL — ABNORMAL HIGH (ref 65–99)
POTASSIUM: 3.9 mmol/L (ref 3.5–5.1)
SODIUM: 136 mmol/L (ref 135–145)

## 2016-05-29 LAB — MAGNESIUM: MAGNESIUM: 1.7 mg/dL (ref 1.7–2.4)

## 2016-05-29 NOTE — Progress Notes (Addendum)
PROGRESS NOTE  [name]  [id number] DOB: 1956-08-13  DOA: 05/20/2016 PCP: Aundria Mems, MD   Brief Narrative:  60 year old female with PMH of HTN, HLD presented to [facility] ED on 05/20/16 with near syncopal episode attributed to dehydration, orthostasis versus vasovagal in the setting of nausea, vomiting and abdominal pain, acute kidney injury and profound hypokalemia. Patient has been unwell since February 2017 with persistent RUQ/epigastric region abdominal pain, nausea, vomiting, unable to consistently keep food down, significant weight loss (30 pounds since February) without constipation or diarrhea. EGD by Dr. Benson Norway apparently unrevealing, supposed to have had a colonoscopy on 6/6 in [place], was scheduled for outpatient HIDA scan on day of admission and while in the waiting room, she developed generalized weakness, lightheadedness and nausea.  Physical therapy would like another day to really assess patient due to debility  Addendum: discussed discharge with patient and she expresses safety concerns since she has become debilitated. I have consulted Physical therapy who informs me that patient is too weak to climb stairs. As such we will undergo discharge planning to ensure a safe discharge plan. Discussed with physical therapist and Education officer, museum.   Assessment & Plan:   Principal Problem:   Near syncope Active Problems:   Hypertension   Microcytic anemia   Hyperlipidemia with target LDL less than 100   Abdominal pain   Acute kidney injury ([place])   Hypokalemia   Nausea and vomiting   AP (abdominal pain)   Acute hypokalemia   Calculus of gallbladder without cholecystitis without obstruction   Protein-calorie malnutrition, severe   Near syncope/syncope - Likely secondary to hypotension from dehydration. - Hydrated with IV fluids. Resolved  Generalized weakness - most likely due to prolonged illness and poor oral intake -Consulted physical therapy would like to  work with patient one more day to assess patients needs moving forward  Abdominal pain/chronic cholecystitis - Has been having chronic abdominal pain, nausea, intermittent vomiting, decreased appetite and weight loss since February 2017. Has undergone extensive evaluation as outpatient. HIDA scan suggested chronic cholecystitis. Recent CT abdomen without evidence of acute cholecystitis. - Gen. surgery was consulted and patient underwent laparoscopic cholecystectomy on 6/6. - Improvement in pain. Patient still complaining of some emesis after trying to consume some soup. - GI consulted and plan is to continue Reglan.  Pt reports still having problems keeping food down but the reglan has helped Nausea/emesis - I suspect gastric dysmotility problem.  - Gi on consult at and plan is to continue Reglan for gastroparesis - Consulted dietitian for gastroparesis diet recommendation.  Hypotension/dehydration - Resolved after IV fluids. Secondary to GI losses and poor oral intake.  Acute kidney injury - Creatinine 1.5 on admission. Held home lisinopril and HCTZ. Hydrated with IV fluids. Resolved.  Essential hypertension - Management as indicated above. Holding HCTZ , lisinopril and amlodipine. When necessary hydralazine  Hypokalemia - Resolved after replacement  Hypomagnesemia - Resolved after replacement  Anemia, microcytic - Follow CBCs. Anemia panel suggests possible chronic disease versus iron deficiency. Colonoscopy to be rescheduled.  DVT prophylaxis: Lovenox Code Status: Full Family Communication: No family at bedside Disposition Plan: DC home within the next one or 2 days   Consultants:   CCS  GI  Procedures:   Laparoscopic cholecystectomy 6/6  Antimicrobials:   None    Subjective: Patient reports she feels weak. Would like to work with physical therapy.  Objective:  Filed Vitals:   05/28/16 1752 05/28/16 2111 05/29/16 0602 05/29/16 0805  BP: 124/74 115/74  101/58 117/67  Pulse: 87 90 85 88  Temp: 98.2 F (36.8 C) 98.9 F (37.2 C) 98.5 F (36.9 C) 98.5 F (36.9 C)  TempSrc: Oral Oral Oral Oral  Resp: 16 16 16 16   Height:      Weight:      SpO2: 100% 98% 100% 100%    Intake/Output Summary (Last 24 hours) at 05/29/16 1553 Last data filed at 05/29/16 1500  Gross per 24 hour  Intake   1187 ml  Output      0 ml  Net   1187 ml   Filed Weights   05/25/16 0547 05/26/16 2133 05/27/16 2015  Weight: 59.376 kg (130 lb 14.4 oz) 61.326 kg (135 lb 3.2 oz) 60.873 kg (134 lb 3.2 oz)    Examination:  General exam: Pleasant middle-aged female, awake and alert Respiratory system: Clear to auscultation. Respiratory effort normal. Cardiovascular system: S1 & S2 heard, RRR. No JVD, murmurs, rubs, gallops or clicks. No pedal edema. telemetry: Sinus rhythm.  Gastrointestinal system: Abdomen is nondistended, soft and mild epigastric/RUQ tenderness without peritoneal signs. No organomegaly or masses felt. Normal bowel sounds heard. Central nervous system: Alert and oriented. No focal neurological deficits. Extremities: Symmetric 5 x 5 power. Skin: No rashes, lesions or ulcers Psychiatry: Judgement and insight appear normal. Mood & affect appropriate.     Data Reviewed: I have personally reviewed following labs and imaging studies  CBC:  Recent Labs Lab 05/23/16 1312  WBC 5.9  HGB 8.7*  HCT 27.0*  MCV 68.4*  PLT [id number]*   Basic Metabolic Panel:  Recent Labs Lab 05/24/16 0014 05/25/16 0503 05/26/16 0430 05/27/16 0446 05/28/16 1259 05/28/16 2330 05/29/16 0625  NA 139 141  --  139 137 136  --   K 2.9* 3.0*  --  2.7* 2.8* 3.9  --   CL 104 102  --  100* 101 101  --   CO2 24 27  --  30 30 30   --   GLUCOSE 60* 72  --  91 140* 119*  --   BUN <5* <5*  --  <5* <5* <5*  --   CREATININE 1.17* 0.99  --  0.92 0.94 0.92  --   CALCIUM 8.7* 8.8*  --  9.2 9.8 9.4  --   MG  --   --  1.6*  --  1.7  --  1.7   GFR: Estimated Creatinine Clearance:  54.7 mL/min (by C-G formula based on Cr of 0.92). Liver Function Tests:  Recent Labs Lab 05/23/16 1312  AST 47*  ALT 11*  ALKPHOS 53  BILITOT 1.3*  PROT 6.3*  ALBUMIN 2.9*    Recent Labs Lab 05/23/16 1312  LIPASE 16   No results for input(s): AMMONIA in the last 168 hours. Coagulation Profile: No results for input(s): INR, PROTIME in the last 168 hours. Cardiac Enzymes: No results for input(s): CKTOTAL, CKMB, CKMBINDEX, TROPONINI in the last 168 hours. BNP (last 3 results) No results for input(s): PROBNP in the last 8760 hours. HbA1C: No results for input(s): HGBA1C in the last 72 hours. CBG: No results for input(s): GLUCAP in the last 168 hours. Lipid Profile: No results for input(s): CHOL, HDL, LDLCALC, TRIG, CHOLHDL, LDLDIRECT in the last 72 hours. Thyroid Function Tests: No results for input(s): TSH, T4TOTAL, FREET4, T3FREE, THYROIDAB in the last 72 hours. Anemia Panel: No results for input(s): VITAMINB12, FOLATE, FERRITIN, TIBC, IRON, RETICCTPCT in the last 72 hours.  Sepsis Labs: No results for input(s): PROCALCITON, LATICACIDVEN  in the last 168 hours.  No results found for this or any previous visit (from the past 240 hour(s)).    Radiology Studies: No results found.   Scheduled Meds: . enoxaparin (LOVENOX) injection  40 mg Subcutaneous Q24H  . feeding supplement (ENSURE ENLIVE)  237 mL Oral TID BM  . metoCLOPramide  5 mg Oral TID AC & HS  . pantoprazole  40 mg Oral QAC breakfast   Continuous Infusions: . lactated ringers    . lactated ringers    Time spent: 35 minutes.  Velvet Bathe, MD [facility] Pager [phone]  If 7PM-7AM, please contact night-coverage [email or web address] Password [email or web address] 05/29/2016, 3:53 PM

## 2016-05-29 NOTE — Evaluation (Signed)
Physical Therapy Evaluation Patient Details Name: [name] MRN: [id number] DOB: 1956-02-01 Today's Date: 05/29/2016   History of Present Illness  60 year old female with PMH of HTN, HLD presented to [facility] ED on 05/20/16 with near syncopal episode attributed to dehydration, orthostasis versus vasovagal in the setting of nausea, vomiting and abdominal pain, acute kidney injury and profound hypokalemia. Patient has been unwell since February 2017 with persistent RUQ/epigastric region abdominal pain, nausea, vomiting, unable to consistently keep food down, significant weight loss (30 pounds since February) without constipation or diarrhea.   Clinical Impression  Pt admitted with above diagnosis. Pt currently with functional limitations due to the deficits listed below (see PT Problem List). Pt with limited tolerance for mobility due to fatigue and weakness, could not ambulate without AD or HHA and could not tolerate stairs. Would benefit from acute PT to increase activity tolerance.  Pt will benefit from skilled PT to increase their independence and safety with mobility to allow discharge to the venue listed below.       Follow Up Recommendations Home health PT    Equipment Recommendations  None recommended by PT    Recommendations for Other Services       Precautions / Restrictions Precautions Precautions: None Restrictions Weight Bearing Restrictions: No      Mobility  Bed Mobility Overal bed mobility: Modified Independent             General bed mobility comments: able to get in and out of bed with increased time and effort but no physical assist needed  Transfers Overall transfer level: Needs assistance Equipment used: 1 person hand held assist Transfers: Sit to/from Stand Sit to Stand: Min assist         General transfer comment: min A for power. slow moving  Ambulation/Gait Ambulation/Gait assistance: Min assist Ambulation Distance (Feet): 100 Feet Assistive  device: 1 person hand held assist Gait Pattern/deviations: Step-through pattern;Decreased stride length;Shuffle Gait velocity: decreased Gait velocity interpretation: Below normal speed for age/gender General Gait Details: pt unable to ambulate without RW or HHA which is a bog change for her. slow, guarded gait. Attempted to ambulate to stairs to practice but could not tolerate distance and make it back to room much less tolerate stairs  Stairs Stairs:  (unable due to weaknss and fatigue )          Wheelchair Mobility    Modified Rankin (Stroke Patients Only)       Balance Overall balance assessment: Needs assistance Sitting-balance support: No upper extremity supported Sitting balance-Leahy Scale: Normal     Standing balance support: No upper extremity supported Standing balance-Leahy Scale: Fair Standing balance comment: fwd flexed due to abdominal pain but able to perform static standing without UE support                             Pertinent Vitals/Pain Pain Assessment: Faces Faces Pain Scale: Hurts even more Pain Location: abdomen Pain Descriptors / Indicators: Aching Pain Intervention(s): Monitored during session;Limited activity within patient's tolerance    Home Living Family/patient expects to be discharged to:: Private residence Living Arrangements: Children Available Help at Discharge: Family;Available PRN/intermittently Type of Home: House Home Access: Stairs to enter Entrance Stairs-Rails: None Entrance Stairs-Number of Steps: 3 Home Layout: Two level;Able to live on main level with bedroom/bathroom Home Equipment: Gilford Rile - 2 wheels Additional Comments: pt lives with son who works full time    Prior Function Level of Independence:  Independent         Comments: pt works full time at [employer]        Extremity/Trunk Assessment   Upper Extremity Assessment: Generalized weakness           Lower  Extremity Assessment: Generalized weakness;RLE deficits/detail;LLE deficits/detail RLE Deficits / Details: hip flex 3-/5, knee flex/ ext 3/5, unable to tolerate resistance or sustain prolonged contraction LLE Deficits / Details: same as RLE  Cervical / Trunk Assessment: Normal  Communication   Communication: No difficulties  Cognition Arousal/Alertness: Awake/alert Behavior During Therapy: WFL for tasks assessed/performed Overall Cognitive Status: Within Functional Limits for tasks assessed                      General Comments General comments (skin integrity, edema, etc.): pt frustrated because she feels she is preparing to go home without any answers from when she came in    Exercises        Assessment/Plan    PT Assessment Patient needs continued PT services  PT Diagnosis Difficulty walking;Generalized weakness;Acute pain   PT Problem List Decreased strength;Decreased activity tolerance;Decreased balance;Decreased mobility;Decreased knowledge of use of DME;Pain  PT Treatment Interventions DME instruction;Gait training;Stair training;Functional mobility training;Therapeutic activities;Therapeutic exercise;Balance training;Patient/family education   PT Goals (Current goals can be found in the Care Plan section) Acute Rehab PT Goals Patient Stated Goal: feel better, return to work PT Goal Formulation: With patient Time For Goal Achievement: 06/12/16 Potential to Achieve Goals: Good    Frequency Min 3X/week   Barriers to discharge Decreased caregiver support home alone all day    Co-evaluation               End of Session   Activity Tolerance: Patient limited by fatigue;Patient limited by pain Patient left: in bed;with call bell/phone within reach;with family/visitor present Nurse Communication: Mobility status         Time: [id number] PT Time Calculation (min) (ACUTE ONLY): 26 min   Charges:   PT Evaluation $PT Eval Moderate Complexity: 1  Procedure PT Treatments $Gait Training: 8-22 mins   PT G Codes:      Leighton Roach, PT  Acute Rehab Services  [place], [place] 05/29/2016, 4:12 PM

## 2016-05-29 NOTE — Care Management Note (Signed)
Case Management Note  Patient Details  Name: Kimberlie Tinney MRN: [id number] Date of Birth: [birth year]  Subjective/Objective:            CM following for progression and d/c planning.        Action/Plan: 05/29/2016 Plan to d/c on 05/30/2016 as pt is very weak and needs to work with PT on steps. Spoke with pt and son, he has purchased a walker. CM ordered 3:1 commode and notified [facility] of need for [place] and HHPT. Have asked MD to order these services.   Expected Discharge Date:    05/30/2016              Expected Discharge Plan:  [place]  In-House Referral:  NA  Discharge planning Services  CM Consult  Post Acute Care Choice:  Durable Medical Equipment, Home Health Choice offered to:  Patient  DME Arranged:  3-N-1 DME Agency:  [place]:  RN, PT [facility] Agency:  [place]  Status of Service:  Completed, signed off  Medicare Important Message Given:    Date Medicare IM Given:    Medicare IM give by:    Date Additional Medicare IM Given:    Additional Medicare Important Message give by:     If discussed at [place] of Stay Meetings, dates discussed:    Additional Comments:  Adron Bene, RN 05/29/2016, 4:45 PM

## 2016-05-30 DIAGNOSIS — E43 Unspecified severe protein-calorie malnutrition: Secondary | ICD-10-CM

## 2016-05-30 LAB — BASIC METABOLIC PANEL
Anion gap: 6 (ref 5–15)
BUN: 9 mg/dL (ref 6–20)
CHLORIDE: 100 mmol/L — AB (ref 101–111)
CO2: 31 mmol/L (ref 22–32)
Calcium: 9.5 mg/dL (ref 8.9–10.3)
Creatinine, Ser: 1.05 mg/dL — ABNORMAL HIGH (ref 0.44–1.00)
GFR calc non Af Amer: 56 mL/min — ABNORMAL LOW (ref >60)
Glucose, Bld: 109 mg/dL — ABNORMAL HIGH (ref 65–99)
POTASSIUM: 3.5 mmol/L (ref 3.5–5.1)
SODIUM: 137 mmol/L (ref 135–145)

## 2016-05-30 LAB — MAGNESIUM: MAGNESIUM: 1.7 mg/dL (ref 1.7–2.4)

## 2016-05-30 MED ORDER — METOCLOPRAMIDE HCL 5 MG PO TABS: 5.0000 mg | ORAL_TABLET | Freq: Three times a day (TID) | ORAL | Status: DC

## 2016-05-30 MED ORDER — MAGNESIUM OXIDE -MG SUPPLEMENT 400 (240 MG) MG PO TABS: 400.0000 mg | ORAL_TABLET | Freq: Two times a day (BID) | ORAL | Status: DC

## 2016-05-30 MED ORDER — ENSURE ENLIVE PO LIQD: 237.0000 mL | Freq: Three times a day (TID) | ORAL | Status: DC

## 2016-05-30 MED ORDER — POTASSIUM CHLORIDE CRYS ER 10 MEQ PO TBCR: 10.0000 meq | EXTENDED_RELEASE_TABLET | Freq: Two times a day (BID) | ORAL | Status: DC

## 2016-05-30 NOTE — Discharge Summary (Addendum)
Physician Discharge Summary  [name] [id number] DOB: 12-May-1956 DOA: 05/20/2016  PCP: Aundria Mems, MD  Admit date: 05/20/2016 Discharge date: 05/30/2016  Recommendations for Outpatient Follow-up:  1. General surgery at already scheduled appointment in late June 2. Gastroenterologist at [facility] in 3-4 weeks to rescheduled colonoscopy and possible capsule endoscopy  3. PCP in 1 week to check potassium and magnesium and blood pressure.  Please repeat CBC.  Does not appear to be iron deficient, however, so may have thalassemia or sickle trait.  Defer to PCP.   4. Home health PT, RN  Discharge Diagnoses:  Principal Problem:   Near syncope Active Problems:   Hypertension   Microcytic anemia   Hyperlipidemia with target LDL less than 100   Abdominal pain   Acute kidney injury ([place])   Hypokalemia   Nausea and vomiting   AP (abdominal pain)   Acute hypokalemia   Calculus of gallbladder without cholecystitis without obstruction   Protein-calorie malnutrition, severe   Discharge Condition: stable, improved  Diet recommendation: regular  Wt Readings from Last 3 Encounters:  05/29/16 60.782 kg (134 lb)  05/15/16 58.287 kg (128 lb 8 oz)  05/12/16 67.132 kg (148 lb)    History of present illness:   60 year old female with PMH of HTN, HLD presented to [facility] ED on 05/20/16 with near syncopal episode attributed to dehydration, orthostasis versus vasovagal in the setting of nausea, vomiting and abdominal pain, acute kidney injury and profound hypokalemia. Patient had been unwell since February 2017 with persistent RUQ/epigastric region abdominal pain, nausea, vomiting, unable to consistently keep food down, significant weight loss (30 pounds since February) without constipation or diarrhea. EGD by Dr. Benson Norway apparently unrevealing, supposed to have had a colonoscopy on 6/6 in [place], was scheduled for outpatient HIDA scan on day of admission and while in the waiting room, she  developed generalized weakness, lightheadedness and nausea.  Hospital Course:   Near syncope/syncope, likely secondary to hypotension from dehydration which was suggested by history and by acute kidney injury.  She was rehydrated and her creatinine trended down from 1.5 to 0.92.  She worked with physical therapy who recommended home health services.    Generalized weakness, most likely due to prolonged illness and poor oral intake.  PT recommended home health services.    Abdominal pain/chronic cholecystitis, Has been having chronic abdominal pain, nausea, intermittent vomiting, decreased appetite and weight loss since February 2017. HIDA scan suggested chronic cholecystitis and she underwent laparoscopic cholecystectomy on 6/6.  Although she had some improvement in pain, she continued to have nausea with vomiting.  Gastroenterology was consulted due to concern for dysmotility or gastroparesis and they recommended reglan.    Essential hypertension, hypotensive initially, but BP rose with IVF.  She had apparently stopped her lisinopril/HCTZ several months ago and had only been taking her amlodipine which she was advised to resume.   Hypokalemia due to nausea and vomiting and anorexia.  Resolved after replacement.  Not on diuretics recently.    Hypomagnesemia due to nausea and vomiting and anorexia.  Resolved after replacement  Anemia, microcytic.  Anemia panel suggests chronic disease. Colonoscopy to be rescheduled, but consider referral to hematology for thalassemia and sickle trait testing.    Severe protein calorie malnutrition with 30-lb weight loss.  Regular diet with supplements.  Procedures:  Laparoscopic cholecystectomy 6/6  Consultations:  CCS  GI  Discharge Exam: Filed Vitals:   05/30/16 0600 05/30/16 0836  BP: 91/53 155/71  Pulse: 88 93  Temp: 99.1  F (37.3 C) 99 F (37.2 C)  Resp: 16 17   Filed Vitals:   05/29/16 1700 05/29/16 2110 05/30/16 0600 05/30/16 0836  BP:  110/66 117/83 91/53 155/71  Pulse: 88 98 88 93  Temp: 98.6 F (37 C) 97.8 F (36.6 C) 99.1 F (37.3 C) 99 F (37.2 C)  TempSrc: Oral Oral Oral Oral  Resp: 15 16 16 17   Height:      Weight:  60.782 kg (134 lb)    SpO2: 100% 100% 99% 100%    General: adult female, no acute distess, Asking to go home Cardiovascular: Regular rate and rhythm, no murmurs, rubs, or gallops Respiratory: Clear to auscultation bilaterally Abdomen: NABS, soft, nondistended, nontender MSK: Normal tone and bulk, no lower extremity edema  Discharge Instructions      Discharge Instructions    Call MD for:  difficulty breathing, headache or visual disturbances    Complete by:  As directed      Call MD for:  extreme fatigue    Complete by:  As directed      Call MD for:  hives    Complete by:  As directed      Call MD for:  persistant dizziness or light-headedness    Complete by:  As directed      Call MD for:  persistant nausea and vomiting    Complete by:  As directed      Call MD for:  severe uncontrolled pain    Complete by:  As directed      Call MD for:  temperature >100.4    Complete by:  As directed      Diet general    Complete by:  As directed      Increase activity slowly    Complete by:  As directed             Medication List    STOP taking these medications        HYDROcodone-acetaminophen 5-325 MG tablet  Commonly known as:  NORCO/VICODIN     hydrOXYzine 50 MG tablet  Commonly known as:  ATARAX/VISTARIL     lisinopril-hydrochlorothiazide 20-25 MG tablet  Commonly known as:  PRINZIDE,ZESTORETIC      TAKE these medications        amLODipine 10 MG tablet  Commonly known as:  NORVASC  Take 1 tablet (10 mg total) by mouth daily.     aspirin EC 81 MG tablet  Take 1 tablet (81 mg total) by mouth daily.     atorvastatin 40 MG tablet  Commonly known as:  LIPITOR  TAKE ONE TABLET BY MOUTH ONCE DAILY     feeding supplement (ENSURE ENLIVE) Liqd  Take 237 mLs by mouth 3  (three) times daily between meals.     Magnesium Oxide 400 (240 Mg) MG Tabs  Take 400 mg by mouth 2 (two) times daily.     metoCLOPramide 5 MG tablet  Commonly known as:  REGLAN  Take 1 tablet (5 mg total) by mouth 4 (four) times daily -  before meals and at bedtime.     ondansetron 8 MG disintegrating tablet  Commonly known as:  ZOFRAN ODT  Take 1 tablet (8 mg total) by mouth every 8 (eight) hours as needed for nausea or vomiting.     pantoprazole 40 MG tablet  Commonly known as:  PROTONIX  Take 1 tablet (40 mg total) by mouth 2 (two) times daily.     potassium chloride 10 MEQ  tablet  Commonly known as:  K-DUR,KLOR-CON  Take 1 tablet (10 mEq total) by mouth 2 (two) times daily.     promethazine 25 MG suppository  Commonly known as:  PHENERGAN  Place 1 suppository (25 mg total) rectally every 6 (six) hours as needed for nausea or vomiting.       Follow-up Information    Follow up with [facility] On 06/12/2016.   Specialty:  General Surgery   Why:  arrive by 8:45AM for a 9:15AM post op check   Contact information:   [address] [place] [address] [phone]       Follow up with Virgia Land, MD. Schedule an appointment as soon as possible for a visit in 3 weeks.   Specialty:  Internal Medicine   Contact information:   [address] [place] [address] [phone]       Follow up with Aundria Mems, MD. Schedule an appointment as soon as possible for a visit in 1 week.   Specialties:  Family Medicine, Sports Medicine, Radiology   Contact information:   [phone] [address] [place] [address] [phone]        The results of significant diagnostics from this hospitalization (including imaging, microbiology, ancillary and laboratory) are listed below for reference.    Significant Diagnostic Studies: Ct Abdomen Pelvis Wo Contrast  05/12/2016  CLINICAL DATA:  60 year old female with persistent  nausea and vomiting as well as possible dehydration. EXAM: CT ABDOMEN AND PELVIS WITHOUT CONTRAST TECHNIQUE: Multidetector CT imaging of the abdomen and pelvis was performed following the standard protocol without IV contrast. Unfortunately, ingestion of oral contrast was limited secondary to emesis. COMPARISON:  CT scan of the abdomen and pelvis 04/04/2016 FINDINGS: Lower chest:  No acute findings. Hepatobiliary: No mass visualized on this un-enhanced exam. High attenuation material layering within the gallbladder consistent with sludge and/or small stones. No gallbladder wall thickening, distention or pericholecystic fluid. Pancreas: No mass or inflammatory process identified on this un-enhanced exam. Spleen: Within normal limits in size. Adrenals/Urinary Tract: No evidence of urolithiasis or hydronephrosis. No definite mass visualized on this un-enhanced exam. Stomach/Bowel: No evidence of obstruction, inflammatory process, or abnormal fluid collections. Vascular/Lymphatic: No pathologically enlarged lymph nodes. No evidence of abdominal aortic aneurysm. Reproductive: Fibroid uterus.  Unremarkable bladder. Other: None. Musculoskeletal:  No suspicious bone lesions identified. IMPRESSION: 1. Sludge and/or stones within the gallbladder lumen. No secondary signs of acute cholecystitis. 2. Otherwise, unremarkable noncontrast CT scan of the abdomen and pelvis. No evidence of obstruction, inflammation or free fluid. Electronically Signed   By: Jacqulynn Cadet M.D.   On: 05/12/2016 09:47   Nm Hepatobiliary Including Gb  05/23/2016  CLINICAL DATA:  Status post cholecystectomy on 05/21/2016, nausea/ vomiting, evaluate for bile leak EXAM: NUCLEAR MEDICINE HEPATOBILIARY IMAGING TECHNIQUE: Sequential images of the abdomen were obtained out to 60 minutes following intravenous administration of radiopharmaceutical. RADIOPHARMACEUTICALS:  5.2 mCi Tc-63m  Choletec IV COMPARISON:  05/20/2016 FINDINGS: Normal hepatic excretion.  Visualization of small bowel within 15 minutes. No evidence of bile leak. IMPRESSION: No evidence of bile leak. Electronically Signed   By: Julian Hy M.D.   On: 05/23/2016 17:49   Nm Hepatobiliary Liver Func  05/20/2016  CLINICAL DATA:  60 year old female with nausea vomiting and right upper quadrant pain. Abnormal right upper quadrant ultrasound on 05/12/2016 demonstrating possible gallstone and sludge, but no strong evidence of acute cholecystitis at that time. Initial encounter. EXAM: NUCLEAR MEDICINE HEPATOBILIARY  IMAGING TECHNIQUE: Sequential images of the abdomen were obtained out to 60 minutes following intravenous administration of radiopharmaceutical. RADIOPHARMACEUTICALS:  6.4 mCi Tc-75m  Choletec IV COMPARISON:  Ultrasound and CT Abdomen and Pelvis 05/12/2016 FINDINGS: Prompt radiotracer uptake by the liver and clearance of the blood pool. Prompt CBD activity by 20 minutes. Small bowel activity by 25-30 minutes which increases over the first hour of the study. No gallbladder activity occurred at 1 hour. The study then was augmented with 2.3 mg IV morphine and additional imaging performed. There is gallbladder activity on the first set of the additional images, which increases to the completion of the exam. IMPRESSION: 1. Patent cystic duct, but gallbladder activity visualized only after augmentation with morphine suggesting chronic cholecystitis. 2. Patent CBD. Electronically Signed   By: Genevie Ann M.D.   On: 05/20/2016 16:51   US Abdomen Limited Ruq  05/12/2016  CLINICAL DATA:  Right upper quadrant pain. EXAM: US ABDOMEN LIMITED - RIGHT UPPER QUADRANT COMPARISON:  None. FINDINGS: Gallbladder: Gallbladder sludge with possible 6 mm gallstone. Negative sonographic Murphy sign. No sonographic Murphy sign. Common bile duct: Diameter: 4 mm Liver: No focal lesion identified. Within normal limits in parenchymal echogenicity. IMPRESSION: Gallbladder sludge with possible 6 mm gallstone. No sonographic  evidence of acute cholecystitis. Electronically Signed   By: Kathreen Devoid   On: 05/12/2016 13:59    Microbiology: No results found for this or any previous visit (from the past 240 hour(s)).   Labs: Basic Metabolic Panel:  Recent Labs Lab 05/25/16 0503 05/26/16 0430 05/27/16 0446 05/28/16 1259 05/28/16 2330 05/29/16 0625 05/30/16 1115  NA 141  --  139 137 136  --  137  K 3.0*  --  2.7* 2.8* 3.9  --  3.5  CL 102  --  100* 101 101  --  100*  CO2 27  --  30 30 30   --  31  GLUCOSE 72  --  91 140* 119*  --  109*  BUN <5*  --  <5* <5* <5*  --  9  CREATININE 0.99  --  0.92 0.94 0.92  --  1.05*  CALCIUM 8.8*  --  9.2 9.8 9.4  --  9.5  MG  --  1.6*  --  1.7  --  1.7 1.7   Liver Function Tests:  Recent Labs Lab 05/23/16 1312  AST 47*  ALT 11*  ALKPHOS 53  BILITOT 1.3*  PROT 6.3*  ALBUMIN 2.9*    Recent Labs Lab 05/23/16 1312  LIPASE 16   No results for input(s): AMMONIA in the last 168 hours. CBC:  Recent Labs Lab 05/23/16 1312  WBC 5.9  HGB 8.7*  HCT 27.0*  MCV 68.4*  PLT 139*   Cardiac Enzymes: No results for input(s): CKTOTAL, CKMB, CKMBINDEX, TROPONINI in the last 168 hours. BNP: BNP (last 3 results) No results for input(s): BNP in the last 8760 hours.  ProBNP (last 3 results) No results for input(s): PROBNP in the last 8760 hours.  CBG: No results for input(s): GLUCAP in the last 168 hours.  Time coordinating discharge: 35 minutes  Signed:  Ramar Nobrega  [facility] 05/30/2016, 12:34 PM

## 2016-05-30 NOTE — Progress Notes (Signed)
Physical Therapy Treatment Patient Details Name: [name] MRN: [id number] DOB: [birth year] Today's Date: 05/30/2016    History of Present Illness 60 year old female with PMH of HTN, HLD presented to [facility] ED on 05/20/16 with near syncopal episode attributed to dehydration, orthostasis versus vasovagal in the setting of nausea, vomiting and abdominal pain, acute kidney injury and profound hypokalemia. Patient has been unwell since February 2017 with persistent RUQ/epigastric region abdominal pain, nausea, vomiting, unable to consistently keep food down, significant weight loss (30 pounds since February) without constipation or diarrhea.     PT Comments    Pt was successful on steps and her family member who is a caregiver was in attendance.  Her plan is for HHPT and will continue on with acute visits if her discharge is not carried out today.  Will need to work on standing endurance acutely if possible, which is a struggle for pt due to her weakness from her dehydration.  Follow Up Recommendations  Home health PT     Equipment Recommendations  None recommended by PT (owns a RW)    Recommendations for Other Services Rehab consult     Precautions / Restrictions Precautions Precautions: None Restrictions Weight Bearing Restrictions: No    Mobility  Bed Mobility Overal bed mobility: Needs Assistance Bed Mobility: Supine to Sit;Sit to Supine     Supine to sit: Min guard Sit to supine: Min guard      Transfers Overall transfer level: Needs assistance Equipment used: 1 person hand held assist Transfers: Sit to/from [facility] Sit to Stand: Min assist Stand pivot transfers: Min guard;Min assist       General transfer comment: assisted to stand and reminders for hand placement  Ambulation/Gait Ambulation/Gait assistance: Min assist;Min guard Ambulation Distance (Feet): 250 Feet Assistive device: 1 person hand held assist Gait Pattern/deviations:  Step-through pattern;Wide base of support;Decreased stride length Gait velocity: decreased Gait velocity interpretation: Below normal speed for age/gender General Gait Details: could walk HHA but recommend RW at home   Stairs Stairs: Yes Stairs assistance: Min assist Stair Management: No rails;Forwards;Step to pattern;Alternating pattern (HHA due to lack of rails at home on 3 steps) Number of Stairs: 3 (min assist) General stair comments: went up and down steps HHA with pt both stepping on one step and then reciprocal pattern as she got more confident  Wheelchair Mobility    Modified Rankin (Stroke Patients Only)       Balance Overall balance assessment: Needs assistance Sitting-balance support: Feet supported Sitting balance-Leahy Scale: Good       Standing balance-Leahy Scale: Fair Standing balance comment: needed steadying after standing and required HHA                    Cognition Arousal/Alertness: Awake/alert Behavior During Therapy: WFL for tasks assessed/performed Overall Cognitive Status: Within Functional Limits for tasks assessed                      Exercises      General Comments General comments (skin integrity, edema, etc.): Pt is looking forward to home and asked about parking for handicapped spaces.  Case manager stated goes to home MD for paperwork and then to [facility]      Pertinent Vitals/Pain Pain Assessment: No/denies pain    Home Living                      Prior Function  PT Goals (current goals can now be found in the care plan section) Acute Rehab PT Goals Patient Stated Goal: feel better, return to work Progress towards PT goals: Progressing toward goals    Frequency  Min 3X/week    PT Plan Current plan remains appropriate    Co-evaluation             End of Session Equipment Utilized During Treatment: Gait belt Activity Tolerance: Patient limited by fatigue Patient left: in bed;with  call bell/phone within reach;with bed alarm set;with family/visitor present (has HD staff coming to do her last visit here)     Time: [id number] PT Time Calculation (min) (ACUTE ONLY): 28 min  Charges:  $Gait Training: 8-22 mins $Therapeutic Activity: 8-22 mins                    G Codes:      Ramond Dial 06/24/2016, 1:08 PM    Mee Hives, PT MS Acute Rehab Dept. Number: [facility]

## 2016-05-30 NOTE — Progress Notes (Signed)
[name] to be D/C'd Home per MD order.  Discussed prescriptions and follow up appointments with the patient. Prescriptions given to patient, medication list explained in detail. Pt verbalized understanding.    Medication List    STOP taking these medications        HYDROcodone-acetaminophen 5-325 MG tablet  Commonly known as:  NORCO/VICODIN     hydrOXYzine 50 MG tablet  Commonly known as:  ATARAX/VISTARIL     lisinopril-hydrochlorothiazide 20-25 MG tablet  Commonly known as:  PRINZIDE,ZESTORETIC      TAKE these medications        amLODipine 10 MG tablet  Commonly known as:  NORVASC  Take 1 tablet (10 mg total) by mouth daily.     aspirin EC 81 MG tablet  Take 1 tablet (81 mg total) by mouth daily.     atorvastatin 40 MG tablet  Commonly known as:  LIPITOR  TAKE ONE TABLET BY MOUTH ONCE DAILY     feeding supplement (ENSURE ENLIVE) Liqd  Take 237 mLs by mouth 3 (three) times daily between meals.     Magnesium Oxide 400 (240 Mg) MG Tabs  Take 400 mg by mouth 2 (two) times daily.     metoCLOPramide 5 MG tablet  Commonly known as:  REGLAN  Take 1 tablet (5 mg total) by mouth 4 (four) times daily -  before meals and at bedtime.     ondansetron 8 MG disintegrating tablet  Commonly known as:  ZOFRAN ODT  Take 1 tablet (8 mg total) by mouth every 8 (eight) hours as needed for nausea or vomiting.     pantoprazole 40 MG tablet  Commonly known as:  PROTONIX  Take 1 tablet (40 mg total) by mouth 2 (two) times daily.     potassium chloride 10 MEQ tablet  Commonly known as:  K-DUR,KLOR-CON  Take 1 tablet (10 mEq total) by mouth 2 (two) times daily.     promethazine 25 MG suppository  Commonly known as:  PHENERGAN  Place 1 suppository (25 mg total) rectally every 6 (six) hours as needed for nausea or vomiting.        Filed Vitals:   05/30/16 0600 05/30/16 0836  BP: 91/53 155/71  Pulse: 88 93  Temp: 99.1 F (37.3 C) 99 F (37.2 C)  Resp: 16 17    Skin clean,  dry and intact without evidence of skin break down, no evidence of skin tears noted. IV catheter discontinued intact. Site without signs and symptoms of complications. Dressing and pressure applied. Pt denies pain at this time. No complaints noted.  An After Visit Summary was printed and given to the patient. Patient escorted via [facility], and D/C home via private auto.  Retta Mac BSN, RN

## 2016-06-10 ENCOUNTER — Ambulatory Visit (INDEPENDENT_AMBULATORY_CARE_PROVIDER_SITE_OTHER): Payer: 59 | Admitting: Sports Medicine

## 2016-06-10 ENCOUNTER — Encounter: Payer: Self-pay | Admitting: Sports Medicine

## 2016-06-10 VITALS — BP 123/80 | HR 88 | Resp 18 | Wt 134.0 lb

## 2016-06-10 DIAGNOSIS — R112 Nausea with vomiting, unspecified: Secondary | ICD-10-CM

## 2016-06-10 DIAGNOSIS — E876 Hypokalemia: Secondary | ICD-10-CM | POA: Diagnosis not present

## 2016-06-10 DIAGNOSIS — E785 Hyperlipidemia, unspecified: Secondary | ICD-10-CM | POA: Diagnosis not present

## 2016-06-10 DIAGNOSIS — Z9049 Acquired absence of other specified parts of digestive tract: Secondary | ICD-10-CM

## 2016-06-10 DIAGNOSIS — D509 Iron deficiency anemia, unspecified: Secondary | ICD-10-CM

## 2016-06-10 MED ORDER — ATORVASTATIN CALCIUM 40 MG PO TABS: 40.0000 mg | ORAL_TABLET | Freq: Every day | ORAL | Status: DC

## 2016-06-10 MED ORDER — METOCLOPRAMIDE HCL 10 MG PO TABS: ORAL_TABLET | ORAL | Status: DC

## 2016-06-10 NOTE — Assessment & Plan Note (Signed)
Refilling Lipitor.

## 2016-06-10 NOTE — Progress Notes (Signed)
  Subjective:    CC: Follow-up  HPI: This is a pleasant 60 year old female, for several months we have been treating her intractable nausea and vomiting, she's been dehydrated several times  and has needed IV fluids at multiple occasions. She seen gastroenterology, CTs in the past have shown fairly severe jejunitis. Ultimately she was waiting in the waiting room for an outpatient HIDA scan, and became syncopal, she was taken to the emergency department in the same hospital, admitted, and had a laboratory cholecystectomy the next day. Had a bit of postoperative vomiting but overall did well and was discharged in good condition. She reports that her nausea and vomiting is improving significantly, still has some. She does take Reglan at 5 mg 4 times per day, Zofran and occasional Phenergan. Has not yet had a gastric emptying study. Does have a follow-up coming up with gastroenterology for capsule endoscopy.  Past medical history, Surgical history, Family history not pertinant except as noted below, Social history, Allergies, and medications have been entered into the medical record, reviewed, and no changes needed.   Review of Systems: No fevers, chills, night sweats, weight loss, chest pain, or shortness of breath.   Objective:    General: Well Developed, well nourished, and in no acute distress.  Neuro: Alert and oriented x3, extra-ocular muscles intact, sensation grossly intact.  HEENT: Normocephalic, atraumatic, pupils equal round reactive to light, neck supple, no masses, no lymphadenopathy, thyroid nonpalpable.  Skin: Warm and dry, no rashes. Cardiac: Regular rate and rhythm, no murmurs rubs or gallops, no lower extremity edema.  Respiratory: Clear to auscultation bilaterally. Not using accessory muscles, speaking in full sentences. Abdomen: Soft, minimally tender to palpation diffusely, nondistended, normal bowel sounds, no palpable masses, well-healed laparoscopic portals.  Impression and  Recommendations:

## 2016-06-10 NOTE — Assessment & Plan Note (Signed)
Chronic cholecystitis noted, postcholecystectomy and starting to feel better.  Still with some nausea.

## 2016-06-10 NOTE — Assessment & Plan Note (Addendum)
Rechecking iron.  Iron indices are okay, previous peripheral smears have showed microcytosis and relative erythrocytosis suggestive of thalassemia, adding reticulocyte count, hemoglobin electrophoresis, erythropoietin levels and referral to hematology.

## 2016-06-10 NOTE — Assessment & Plan Note (Signed)
Rechecking potassium.

## 2016-06-10 NOTE — Assessment & Plan Note (Signed)
Improved post cholecystectomy but still present, adding gastric emptying study. She will continue Reglan, increasing to 10 mg 3 times per day, continue Zofran, and continue Phenergan as needed.

## 2016-06-11 ENCOUNTER — Telehealth: Payer: Self-pay | Admitting: Sports Medicine

## 2016-06-11 LAB — VITAMIN B12: Vitamin B-12: 654 pg/mL (ref 200–1100)

## 2016-06-11 LAB — CBC
HCT: 27.7 % — ABNORMAL LOW (ref 35.0–45.0)
Hemoglobin: 8.2 g/dL — ABNORMAL LOW (ref 11.7–15.5)
MCH: 22.7 pg — ABNORMAL LOW (ref 27.0–33.0)
MCHC: 29.6 g/dL — ABNORMAL LOW (ref 32.0–36.0)
MCV: 76.5 fL — ABNORMAL LOW (ref 80.0–100.0)
MPV: 9.1 fL (ref 7.5–12.5)
Platelets: 319 K/uL (ref 140–400)
RBC: 3.62 MIL/uL — ABNORMAL LOW (ref 3.80–5.10)
RDW: 18.6 % — ABNORMAL HIGH (ref 11.0–15.0)
WBC: 6.3 K/uL (ref 3.8–10.8)

## 2016-06-11 LAB — TSH: TSH: 1.98 m[IU]/L

## 2016-06-11 LAB — IRON AND TIBC
%SAT: 36 % (ref 11–50)
Iron: 56 ug/dL (ref 45–160)
TIBC: 155 ug/dL — ABNORMAL LOW (ref 250–450)
UIBC: 99 ug/dL — ABNORMAL LOW (ref 125–400)

## 2016-06-11 LAB — FOLATE: Folate: 11 ng/mL (ref >5.4)

## 2016-06-11 LAB — FERRITIN: Ferritin: 209 ng/mL (ref 20–288)

## 2016-06-11 NOTE — Addendum Note (Signed)
Addended by: Silverio Decamp on: 06/11/2016 08:30 AM   Modules accepted: Orders

## 2016-06-11 NOTE — Telephone Encounter (Signed)
Ordered cancelled. Pt has had this imaging study ordered and approved from Dr. Virgia Land and is still valid until 06/30/16. Insurance will not approve study twice at the same time by different providers. Left this information on Pt's VM. Advised per Dr. Dianah Field to contact Dr. Roney Mans for scheduling.

## 2016-06-12 LAB — COMPREHENSIVE METABOLIC PANEL WITH GFR
ALT: 9 U/L (ref 6–29)
Alkaline Phosphatase: 76 U/L (ref 33–130)
BUN: 10 mg/dL (ref 7–25)
Chloride: 104 mmol/L (ref 98–110)
Creat: 0.92 mg/dL (ref 0.50–0.99)
Glucose, Bld: 96 mg/dL (ref 65–99)
Potassium: 4.2 mmol/L (ref 3.5–5.3)
Sodium: 139 mmol/L (ref 135–146)
Total Protein: 6.3 g/dL (ref 6.1–8.1)

## 2016-06-12 LAB — COMPREHENSIVE METABOLIC PANEL
AST: 33 U/L (ref 10–35)
Albumin: 3.3 g/dL — ABNORMAL LOW (ref 3.6–5.1)
CO2: 28 mmol/L (ref 20–31)
Calcium: 8.6 mg/dL (ref 8.6–10.4)
Total Bilirubin: 0.5 mg/dL (ref 0.2–1.2)

## 2016-06-19 ENCOUNTER — Telehealth: Payer: Self-pay | Admitting: Hematology & Oncology

## 2016-06-19 NOTE — Telephone Encounter (Signed)
Called patient to schedule new patient appt. Patient wants me to talk with her son.       [facility].

## 2016-06-24 ENCOUNTER — Encounter: Payer: Self-pay | Admitting: Family

## 2016-06-24 ENCOUNTER — Other Ambulatory Visit: Payer: Self-pay | Admitting: Family

## 2016-06-24 ENCOUNTER — Other Ambulatory Visit (HOSPITAL_BASED_OUTPATIENT_CLINIC_OR_DEPARTMENT_OTHER): Payer: 59

## 2016-06-24 ENCOUNTER — Ambulatory Visit: Payer: 59

## 2016-06-24 ENCOUNTER — Ambulatory Visit (HOSPITAL_BASED_OUTPATIENT_CLINIC_OR_DEPARTMENT_OTHER): Payer: 59 | Admitting: Family

## 2016-06-24 VITALS — BP 149/90 | HR 73 | Temp 98.7°F | Resp 16 | Ht 64.0 in

## 2016-06-24 DIAGNOSIS — D509 Iron deficiency anemia, unspecified: Secondary | ICD-10-CM

## 2016-06-24 DIAGNOSIS — D563 Thalassemia minor: Secondary | ICD-10-CM

## 2016-06-24 DIAGNOSIS — D631 Anemia in chronic kidney disease: Secondary | ICD-10-CM

## 2016-06-24 DIAGNOSIS — D560 Alpha thalassemia: Secondary | ICD-10-CM

## 2016-06-24 LAB — CBC WITH DIFFERENTIAL (CANCER CENTER ONLY)
BASO#: 0 10*3/uL (ref 0.0–0.2)
BASO%: 0.3 % (ref 0.0–2.0)
EOS ABS: 0.2 10*3/uL (ref 0.0–0.5)
EOS%: 2.4 % (ref 0.0–7.0)
HEMATOCRIT: 29.7 % — AB (ref 34.8–46.6)
HEMOGLOBIN: 9.2 g/dL — AB (ref 11.6–15.9)
LYMPH#: 2.4 10*3/uL (ref 0.9–3.3)
LYMPH%: 39.1 % (ref 14.0–48.0)
MCH: 23.8 pg — AB (ref 26.0–34.0)
MCHC: 31 g/dL — AB (ref 32.0–36.0)
MCV: 77 fL — AB (ref 81–101)
MONO#: 0.5 10*3/uL (ref 0.1–0.9)
MONO%: 7.4 % (ref 0.0–13.0)
NEUT%: 50.8 % (ref 39.6–80.0)
NEUTROS ABS: 3.2 10*3/uL (ref 1.5–6.5)
PLATELETS: 304 10*3/uL (ref 145–400)
RBC: 3.87 10*6/uL (ref 3.70–5.32)
RDW: 17.8 % — AB (ref 11.1–15.7)
WBC: 6.2 10*3/uL (ref 3.9–10.0)

## 2016-06-24 LAB — COMPREHENSIVE METABOLIC PANEL
AST: 24 U/L (ref 5–34)
Albumin: 3.4 g/dL — ABNORMAL LOW (ref 3.5–5.0)
Alkaline Phosphatase: 70 U/L (ref 40–150)
Anion Gap: 8 mEq/L (ref 3–11)
BUN: 13.5 mg/dL (ref 7.0–26.0)
CHLORIDE: 101 meq/L (ref 98–109)
CO2: 32 meq/L — AB (ref 22–29)
CREATININE: 1 mg/dL (ref 0.6–1.1)
Calcium: 9.8 mg/dL (ref 8.4–10.4)
EGFR: 72 mL/min/{1.73_m2} — ABNORMAL LOW (ref >90)
GLUCOSE: 85 mg/dL (ref 70–140)
POTASSIUM: 3.2 meq/L — AB (ref 3.5–5.1)
SODIUM: 141 meq/L (ref 136–145)
Total Bilirubin: 0.33 mg/dL (ref 0.20–1.20)
Total Protein: 7.7 g/dL (ref 6.4–8.3)

## 2016-06-24 LAB — LACTATE DEHYDROGENASE: LDH: 160 U/L (ref 125–245)

## 2016-06-24 LAB — CHCC SATELLITE - SMEAR

## 2016-06-24 MED ORDER — FOLIC ACID 1 MG PO TABS: 1.0000 mg | ORAL_TABLET | Freq: Every day | ORAL | Status: DC

## 2016-06-24 NOTE — Progress Notes (Signed)
Hematology/Oncology Consultation   Name: [name]      MRN: [id number]    Location: Room/bed info not found  Date: 06/24/2016 Time:12:26 PM   REFERRING PHYSICIAN: Aundria Mems, MD  REASON FOR CONSULT: Microcytic anemia   DIAGNOSIS:  Microcytic anemia Thalassemia anemia   HISTORY OF PRESENT ILLNESS: [name] is a very pleasant 60 yo African American female with history of anemia. Her Hgb at this time is 9.2. She is symptomatic with chills, fatigue and weakness.  No family history of anemia.  Iron studies two weeks ago were ok. No B12 deficiency.  She has taken an oral iron supplement in the past but not lately. She has never received IV iron or a blood transfusion.  Her appetite comes and goes. She has some nausea at times, no vomiting. She had her gall bladder removed in June and has also had some issues with constipation. She has had a 30 lb weight loss since February and the onset of her gallbladder issues. She still has some mid abdominal pain that comes and goes.  She is followed by GI (Dr. Benson Norway in [place]) and is waiting to "get some strength back" before scheduling her colonoscopy. She denies any bright red blood or dark, tarry stools.  No episodes of bleeding, bruising or petechiae. No lymphadenopathy found on exam.  No personal cancer history. She had an uncle that passed away from colon and liver cancer.  No fever, cough, rash, dizziness, SOB, chest pain, palpitations, abdominal pain or changes in bowel or bladder habits.  No swelling, tenderness, numbness or tingling in her extremities. No c/o joint aches or "bone" pain.  She has one child and had no problem with her pregnancy. No miscarriages.  No tobacco or alcohol use.  She is originally from [place] and worked for years with the post office.   ROS: All other 10 point review of systems is negative.   PAST MEDICAL HISTORY:   Past Medical History  Diagnosis Date  . Hypertension   . Hyperlipidemia   .  Abdominal pain     persistent    ALLERGIES: Allergies  Allergen Reactions  . Isovue [Iopamidol] Hives    Pt broke out in several facial hives, one on her back.  She will need full premeds in the future.  J Bohm No allergy demonstrated to  Orally ingested Iodinated agent. (Gastrographin CT Scan 05-12-16) Pt broke out in several facial hives, one on her back.  She will need full premeds in the future.  Alfonse Alpers      MEDICATIONS:  Current Outpatient Prescriptions on File Prior to Visit  Medication Sig Dispense Refill  . amLODipine (NORVASC) 10 MG tablet Take 1 tablet (10 mg total) by mouth daily. 30 tablet 0  . aspirin EC 81 MG tablet Take 1 tablet (81 mg total) by mouth daily. 90 tablet 3  . atorvastatin (LIPITOR) 40 MG tablet Take 1 tablet (40 mg total) by mouth daily. 90 tablet 3  . feeding supplement, ENSURE ENLIVE, (ENSURE ENLIVE) LIQD Take 237 mLs by mouth 3 (three) times daily between meals. 90 Bottle 0  . metoCLOPramide (REGLAN) 10 MG tablet 1 tab PO TID 90 tablet 3  . ondansetron (ZOFRAN ODT) 8 MG disintegrating tablet Take 1 tablet (8 mg total) by mouth every 8 (eight) hours as needed for nausea or vomiting. 12 tablet 0  . pantoprazole (PROTONIX) 40 MG tablet Take 1 tablet (40 mg total) by mouth 2 (two) times daily. 60 tablet 3  .  potassium chloride (K-DUR,KLOR-CON) 10 MEQ tablet Take 1 tablet (10 mEq total) by mouth 2 (two) times daily. 60 tablet 0  . promethazine (PHENERGAN) 25 MG suppository Place 1 suppository (25 mg total) rectally every 6 (six) hours as needed for nausea or vomiting. 12 each 0   No current facility-administered medications on file prior to visit.     PAST SURGICAL HISTORY Past Surgical History  Procedure Laterality Date  . Colonoscopy    . Cholecystectomy N/A 05/21/2016    Procedure: LAPAROSCOPIC CHOLECYSTECTOMY;  Surgeon: Erroll Luna, MD;  Location: [facility] OR;  Service: General;  Laterality: N/A;    FAMILY HISTORY: Family History  Problem Relation Age  of Onset  . Stroke Mother   . Hyperlipidemia Mother   . Diabetes Mother     SOCIAL HISTORY:  reports that she has never smoked. She has never used smokeless tobacco. She reports that she does not drink alcohol or use illicit drugs.  PERFORMANCE STATUS: The patient's performance status is 1 - Symptomatic but completely ambulatory  PHYSICAL EXAM: Most Recent Vital Signs: Blood pressure 149/90, pulse 73, temperature 98.7 F (37.1 C), temperature source Oral, resp. rate 16, height 5\' 4"  (1.626 m). BP 149/90 mmHg  Pulse 73  Temp(Src) 98.7 F (37.1 C) (Oral)  Resp 16  Ht 5\' 4"  (1.626 m)  General Appearance:    Alert, cooperative, no distress, appears stated age  Head:    Normocephalic, without obvious abnormality, atraumatic  Eyes:    PERRL, conjunctiva/corneas clear, EOM's intact, fundi    benign, both eyes        Throat:   Lips, mucosa, and tongue normal; teeth and gums normal  Neck:   Supple, symmetrical, trachea midline, no adenopathy;    thyroid:  no enlargement/tenderness/nodules; no carotid   bruit or JVD  Back:     Symmetric, no curvature, ROM normal, no CVA tenderness  Lungs:     Clear to auscultation bilaterally, respirations unlabored  Chest Wall:    No tenderness or deformity   Heart:    Regular rate and rhythm, S1 and S2 normal, no murmur, rub   or gallop     Abdomen:     Soft, non-tender, bowel sounds active all four quadrants,    no masses, no organomegaly        Extremities:   Extremities normal, atraumatic, no cyanosis or edema  Pulses:   2+ and symmetric all extremities  Skin:   Skin color, texture, turgor normal, no rashes or lesions  Lymph nodes:   Cervical, supraclavicular, and axillary nodes normal  Neurologic:   CNII-XII intact, normal strength, sensation and reflexes    throughout    LABORATORY DATA:  Results for orders placed or performed in visit on 06/24/16 (from the past 48 hour(s))  CBC w/Diff     Status: Abnormal   Collection Time: 06/24/16  11:40 AM  Result Value Ref Range   WBC 6.2 3.9 - 10.0 10e3/uL   RBC 3.87 3.70 - 5.32 10e6/uL   HGB 9.2 (L) 11.6 - 15.9 g/dL   HCT 29.7 (L) 34.8 - 46.6 %   MCV 77 (L) 81 - 101 fL   MCH 23.8 (L) 26.0 - 34.0 pg   MCHC 31.0 (L) 32.0 - 36.0 g/dL   RDW 17.8 (H) 11.1 - 15.7 %   Platelets 304 145 - 400 10e3/uL   NEUT# 3.2 1.5 - 6.5 10e3/uL   LYMPH# 2.4 0.9 - 3.3 10e3/uL   MONO# 0.5 0.1 - 0.9  10e3/uL   Eosinophils Absolute 0.2 0.0 - 0.5 10e3/uL   BASO# 0.0 0.0 - 0.2 10e3/uL   NEUT% 50.8 39.6 - 80.0 %   LYMPH% 39.1 14.0 - 48.0 %   MONO% 7.4 0.0 - 13.0 %   EOS% 2.4 0.0 - 7.0 %   BASO% 0.3 0.0 - 2.0 %  Smear     Status: None   Collection Time: 06/24/16 11:40 AM  Result Value Ref Range   Smear Result Smear Available       RADIOGRAPHY: No results found.     PATHOLOGY: None  ASSESSMENT/PLAN: [name] is a very pleasant 60 yo African American female with history of microcytic anemia. She is symptomatic at this time with chills, fatigue and weakness. Her Hgb is 9.2 with an MCV of 77.  Her blood smear, viewed by Dr. Marin Olp, was indicative of alpha thalassemia. She will go ahead and start taking folic acid 1 mg daily.  Iron studies, erythropoietin, retic count and LDH look good. We will see what her lab work shows. Once we have all her results we will schedule her follow-up.   All questions were answered. She will contact our office with any problems, questions or concerns. We can certainly see her much sooner if necessary.  She was discussed with and also seen by Dr. Marin Olp and he is in agreement with the aforementioned.   [facility]     Addendum:  I saw and examined the patient with [name]. I looked at her blood smear. She had some microcytic red blood cells. She has some target cells. I do not see any nucleated red blood cells. There were no schistocytes or spherocytes. White blood cells appeared mature. Platelets looked okay.  She certainly is quite anemic. I am somewhat  surprised that her iron studies looked okay. Her ferritin is 130. Her percent saturation is 31%.  I don't think she is hemolyzing.  She does have a quite low erythropoietin level. As such, this might be the problem.  She has a normal hemoglobin electrophoresis. 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  I think what is very telling is a fact that her corrected reticulocyte count is about 1%. I think this really shows that she is not able to mount an erythroid response because of her low erythropoietin level.  We probably will get her started on Aranesp. I think this might be the best way to try to get her blood count up.  We spent about 45 minutes with her. This is a true interesting situation.  We'll plan to get her back in a few weeks for follow-up. Again, we will get her started on Aranesp.  Pete E.  We probably will get her started

## 2016-06-25 LAB — IRON AND TIBC
%SAT: 31 % (ref 21–57)
Iron: 63 ug/dL (ref 41–142)
TIBC: 203 ug/dL — AB (ref 236–444)
UIBC: 139 ug/dL (ref 120–384)

## 2016-06-25 LAB — HEMOGLOBINOPATHY EVALUATION
HEMOGLOBIN A2 QUANTITATION: 1.8 % (ref 0.7–3.1)
HEMOGLOBIN F QUANTITATION: 0 % (ref 0.0–2.0)
HGB A: 98.2 % — AB (ref 94.0–98.0)
HGB C: 0 %
HGB S: 0 %

## 2016-06-25 LAB — RETICULOCYTES: Reticulocyte Count: 1.9 % (ref 0.6–2.6)

## 2016-06-25 LAB — FERRITIN: Ferritin: 130 ng/ml (ref 9–269)

## 2016-06-25 LAB — ERYTHROPOIETIN: Erythropoietin: 12.4 m[IU]/mL (ref 2.6–18.5)

## 2016-06-26 ENCOUNTER — Encounter: Payer: Self-pay | Admitting: Family

## 2016-06-26 ENCOUNTER — Other Ambulatory Visit: Payer: Self-pay | Admitting: Family

## 2016-06-26 DIAGNOSIS — D631 Anemia in chronic kidney disease: Secondary | ICD-10-CM

## 2016-06-26 DIAGNOSIS — D560 Alpha thalassemia: Secondary | ICD-10-CM | POA: Insufficient documentation

## 2016-06-26 DIAGNOSIS — D563 Thalassemia minor: Secondary | ICD-10-CM

## 2016-06-26 HISTORY — DX: Anemia in chronic kidney disease: D63.1

## 2016-06-26 HISTORY — DX: Thalassemia minor: D56.3

## 2016-07-01 LAB — ALPHA-THALASSEMIA GENOTYPR: PDF: 0

## 2016-07-10 ENCOUNTER — Ambulatory Visit: Payer: 59

## 2016-07-10 ENCOUNTER — Other Ambulatory Visit: Payer: 59

## 2016-07-10 ENCOUNTER — Ambulatory Visit: Payer: 59 | Admitting: Family

## 2016-07-12 ENCOUNTER — Other Ambulatory Visit: Payer: Self-pay

## 2016-07-12 ENCOUNTER — Other Ambulatory Visit: Payer: Self-pay | Admitting: Family

## 2016-07-12 DIAGNOSIS — D631 Anemia in chronic kidney disease: Secondary | ICD-10-CM

## 2016-07-12 DIAGNOSIS — D563 Thalassemia minor: Secondary | ICD-10-CM

## 2016-07-15 ENCOUNTER — Other Ambulatory Visit (HOSPITAL_BASED_OUTPATIENT_CLINIC_OR_DEPARTMENT_OTHER): Payer: 59

## 2016-07-15 ENCOUNTER — Ambulatory Visit (HOSPITAL_BASED_OUTPATIENT_CLINIC_OR_DEPARTMENT_OTHER): Payer: 59

## 2016-07-15 ENCOUNTER — Ambulatory Visit (HOSPITAL_BASED_OUTPATIENT_CLINIC_OR_DEPARTMENT_OTHER): Payer: 59 | Admitting: Family

## 2016-07-15 ENCOUNTER — Encounter: Payer: Self-pay | Admitting: Family

## 2016-07-15 VITALS — BP 158/91 | HR 62 | Temp 97.8°F | Resp 18 | Ht 64.0 in | Wt 129.1 lb

## 2016-07-15 DIAGNOSIS — D631 Anemia in chronic kidney disease: Secondary | ICD-10-CM

## 2016-07-15 DIAGNOSIS — N189 Chronic kidney disease, unspecified: Secondary | ICD-10-CM | POA: Diagnosis not present

## 2016-07-15 DIAGNOSIS — D563 Thalassemia minor: Secondary | ICD-10-CM

## 2016-07-15 LAB — CBC WITH DIFFERENTIAL (CANCER CENTER ONLY)
BASO#: 0 10*3/uL (ref 0.0–0.2)
BASO%: 0.6 % (ref 0.0–2.0)
EOS ABS: 0.2 10*3/uL (ref 0.0–0.5)
EOS%: 4.3 % (ref 0.0–7.0)
HCT: 32.5 % — ABNORMAL LOW (ref 34.8–46.6)
HGB: 10.3 g/dL — ABNORMAL LOW (ref 11.6–15.9)
LYMPH#: 2.5 10*3/uL (ref 0.9–3.3)
LYMPH%: 51.3 % — AB (ref 14.0–48.0)
MCH: 23.9 pg — AB (ref 26.0–34.0)
MCHC: 31.7 g/dL — AB (ref 32.0–36.0)
MCV: 75 fL — AB (ref 81–101)
MONO#: 0.4 10*3/uL (ref 0.1–0.9)
MONO%: 8.1 % (ref 0.0–13.0)
NEUT#: 1.8 10*3/uL (ref 1.5–6.5)
NEUT%: 35.7 % — AB (ref 39.6–80.0)
PLATELETS: 202 10*3/uL (ref 145–400)
RBC: 4.31 10*6/uL (ref 3.70–5.32)
RDW: 15.1 % (ref 11.1–15.7)
WBC: 4.9 10*3/uL (ref 3.9–10.0)

## 2016-07-15 LAB — COMPREHENSIVE METABOLIC PANEL
ANION GAP: 8 meq/L (ref 3–11)
AST: 30 U/L (ref 5–34)
Albumin: 3.7 g/dL (ref 3.5–5.0)
Alkaline Phosphatase: 72 U/L (ref 40–150)
BILIRUBIN TOTAL: 0.51 mg/dL (ref 0.20–1.20)
BUN: 5.9 mg/dL — ABNORMAL LOW (ref 7.0–26.0)
CALCIUM: 9.7 mg/dL (ref 8.4–10.4)
CHLORIDE: 103 meq/L (ref 98–109)
CO2: 30 mEq/L — ABNORMAL HIGH (ref 22–29)
CREATININE: 1.1 mg/dL (ref 0.6–1.1)
EGFR: 64 mL/min/{1.73_m2} — AB (ref >90)
Glucose: 73 mg/dl (ref 70–140)
Potassium: 3.6 mEq/L (ref 3.5–5.1)
Sodium: 141 mEq/L (ref 136–145)
Total Protein: 7.9 g/dL (ref 6.4–8.3)

## 2016-07-15 LAB — CHCC SATELLITE - SMEAR

## 2016-07-15 MED ORDER — DARBEPOETIN ALFA 300 MCG/0.6ML IJ SOSY
PREFILLED_SYRINGE | INTRAMUSCULAR | Status: AC
  Filled 2016-07-15: qty 0.6

## 2016-07-15 MED ORDER — DARBEPOETIN ALFA 300 MCG/0.6ML IJ SOSY
300.0000 ug | PREFILLED_SYRINGE | Freq: Once | INTRAMUSCULAR | Status: AC
  Administered 2016-07-15: 300 ug via SUBCUTANEOUS

## 2016-07-15 NOTE — Progress Notes (Addendum)
Hematology and [name] Follow Up Visit  [name] 05/30/1956 60 y.o. 07/15/2016   Principle Diagnosis:  Erythropoietin deficiency anemia  Current Therapy:   Aranesp 300 mcg SQ for Hgb < 11 Folic acid 1 mg PO daily     Interim History:  [name] is here today with her son for a follow-up. She is still having a good bit of fatigue. Her corrected retic count was lower as was her erythropoietin earlier this month. We have since gotten her PA approval for Aranesp.  She is taking her folic acid daily as prescribed. She was positive for alpha thalassemia.  No fever, chills, n/v, cough, rash, dizziness, SOB, chest pain, palpitations, abdominal pain or changes in bowel or bladder habits.  No lymphadenopathy found on exam. No bleeding, bruising or petechiae.  No swelling, tenderness, numbness or tingling in her extremities. No c/o joint aches or bone pain.  Her appetite comes and goes. She does states that she is staying hydrated. She will try supplementing between meals 2-3 times daily.   Medications:    Medication List       Accurate as of 07/15/16 12:27 PM. Always use your most recent med list.          amLODipine 10 MG tablet Commonly known as:  NORVASC Take 1 tablet (10 mg total) by mouth daily.   atorvastatin 40 MG tablet Commonly known as:  LIPITOR Take 1 tablet (40 mg total) by mouth daily.   folic acid 1 MG tablet Commonly known as:  FOLVITE Take 1 tablet (1 mg total) by mouth daily.   metoCLOPramide 10 MG tablet Commonly known as:  REGLAN 1 tab PO TID   ondansetron 8 MG disintegrating tablet Commonly known as:  ZOFRAN ODT Take 1 tablet (8 mg total) by mouth every 8 (eight) hours as needed for nausea or vomiting.   pantoprazole 40 MG tablet Commonly known as:  PROTONIX Take 1 tablet (40 mg total) by mouth 2 (two) times daily.   potassium chloride 10 MEQ tablet Commonly known as:  K-DUR,KLOR-CON Take 1 tablet (10 mEq total) by mouth 2 (two)  times daily.       Allergies:  Allergies  Allergen Reactions  . Isovue [Iopamidol] Hives    Pt broke out in several facial hives, one on her back.  She will need full premeds in the future.  J Bohm No allergy demonstrated to  Orally ingested Iodinated agent. (Gastrographin CT Scan 05-12-16) Pt broke out in several facial hives, one on her back.  She will need full premeds in the future.  Alfonse Alpers    Past Medical History, Surgical history, Social history, and Family History were reviewed and updated.  Review of Systems: All other 10 point review of systems is negative.   Physical Exam:  height is 5\' 4"  (1.626 m) and weight is 129 lb 1.9 oz (58.6 kg). Her oral temperature is 97.8 F (36.6 C). Her blood pressure is 158/91 (abnormal) and her pulse is 62. Her respiration is 18.   Wt Readings from Last 3 Encounters:  07/15/16 129 lb 1.9 oz (58.6 kg)  06/10/16 134 lb (60.8 kg)  05/29/16 134 lb (60.8 kg)    Ocular: Sclerae unicteric, pupils equal, round and reactive to light Ear-nose-throat: Oropharynx clear, dentition fair Lymphatic: No cervical supraclavicular or axillary adenopathy Lungs no rales or rhonchi, good excursion bilaterally Heart regular rate and rhythm, no murmur appreciated Abd soft, nontender, positive bowel sounds, no liver or spleen tip palpated  on exam, no fluid wave MSK no focal spinal tenderness, no joint edema Neuro: non-focal, well-oriented, appropriate affect Breasts: Deferred  Lab Results  Component Value Date   WBC 4.9 07/15/2016   HGB 10.3 (L) 07/15/2016   HCT 32.5 (L) 07/15/2016   MCV 75 (L) 07/15/2016   PLT 202 07/15/2016   Lab Results  Component Value Date   FERRITIN 130 06/24/2016   IRON 63 06/24/2016   TIBC 203 (L) 06/24/2016   UIBC 139 06/24/2016   IRONPCTSAT 31 06/24/2016   Lab Results  Component Value Date   RETICCTPCT <0.4 (L) 05/20/2016   RBC 4.31 07/15/2016   No results found for: KPAFRELGTCHN, LAMBDASER, KAPLAMBRATIO No results  found for: IGGSERUM, IGA, IGMSERUM No results found for: Odetta Pink, SPEI   Chemistry      Component Value Date/Time   NA 141 06/24/2016 1140   K 3.2 (L) 06/24/2016 1140   CL 104 06/10/2016 1509   CO2 32 (H) 06/24/2016 1140   BUN 13.5 06/24/2016 1140   CREATININE 1.0 06/24/2016 1140      Component Value Date/Time   CALCIUM 9.8 06/24/2016 1140   ALKPHOS 70 06/24/2016 1140   AST 24 06/24/2016 1140   ALT <9 06/24/2016 1140   BILITOT 0.33 06/24/2016 1140     Impression and Plan: [name] is a very pleasant 60 yo African American female with history of erythropoietin deficiency anemia and alpha thalassemia. Corrected retic count and erythropoietin were both lower earlier this month. Her Hgb is slightly improved today. She is taking her folic acid daily.  We will proceed with her Aranesp 300 mcg injection today per Dr. Marin Olp to get her Hgb above 11.  We will plan to see her back in 6 weeks to repeat labs and re-evaluate her progress.  Both she and her son know to contact our office with any questions or concerns. We can certainly see her sooner if need be.   Eliezer Bottom, NP 7/31/201712:27 PM

## 2016-07-15 NOTE — Patient Instructions (Signed)
Darbepoetin Alfa injection What is this medicine? DARBEPOETIN ALFA (dar be POE e tin AL fa) helps your body make more red blood cells. It is used to treat anemia caused by chronic kidney failure and chemotherapy. This medicine may be used for other purposes; ask your health care provider or pharmacist if you have questions. What should I tell my health care provider before I take this medicine? They need to know if you have any of these conditions: -blood clotting disorders or history of blood clots -cancer patient not on chemotherapy -cystic fibrosis -heart disease, such as angina, heart failure, or a history of a heart attack -hemoglobin level of 12 g/dL or greater -high blood pressure -low levels of folate, iron, or vitamin B12 -seizures -an unusual or allergic reaction to darbepoetin, erythropoietin, albumin, hamster proteins, latex, other medicines, foods, dyes, or preservatives -pregnant or trying to get pregnant -breast-feeding How should I use this medicine? This medicine is for injection into a vein or under the skin. It is usually given by a health care professional in a hospital or clinic setting. If you get this medicine at home, you will be taught how to prepare and give this medicine. Do not shake the solution before you withdraw a dose. Use exactly as directed. Take your medicine at regular intervals. Do not take your medicine more often than directed. It is important that you put your used needles and syringes in a special sharps container. Do not put them in a trash can. If you do not have a sharps container, call your pharmacist or healthcare provider to get one. Talk to your pediatrician regarding the use of this medicine in children. While this medicine may be used in children as young as 1 year for selected conditions, precautions do apply. Overdosage: If you think you have taken too much of this medicine contact a poison control center or emergency room at once. NOTE:  This medicine is only for you. Do not share this medicine with others. What if I miss a dose? If you miss a dose, take it as soon as you can. If it is almost time for your next dose, take only that dose. Do not take double or extra doses. What may interact with this medicine? Do not take this medicine with any of the following medications: -epoetin alfa This list may not describe all possible interactions. Give your health care provider a list of all the medicines, herbs, non-prescription drugs, or dietary supplements you use. Also tell them if you smoke, drink alcohol, or use illegal drugs. Some items may interact with your medicine. What should I watch for while using this medicine? Visit your prescriber or health care professional for regular checks on your progress and for the needed blood tests and blood pressure measurements. It is especially important for the doctor to make sure your hemoglobin level is in the desired range, to limit the risk of potential side effects and to give you the best benefit. Keep all appointments for any recommended tests. Check your blood pressure as directed. Ask your doctor what your blood pressure should be and when you should contact him or her. As your body makes more red blood cells, you may need to take iron, folic acid, or vitamin B supplements. Ask your doctor or health care provider which products are right for you. If you have kidney disease continue dietary restrictions, even though this medication can make you feel better. Talk with your doctor or health care professional about the   foods you eat and the vitamins that you take. What side effects may I notice from receiving this medicine? Side effects that you should report to your doctor or health care professional as soon as possible: -allergic reactions like skin rash, itching or hives, swelling of the face, lips, or tongue -breathing problems -changes in vision -chest pain -confusion, trouble speaking  or understanding -feeling faint or lightheaded, falls -high blood pressure -muscle aches or pains -pain, swelling, warmth in the leg -rapid weight gain -severe headaches -sudden numbness or weakness of the face, arm or leg -trouble walking, dizziness, loss of balance or coordination -seizures (convulsions) -swelling of the ankles, feet, hands -unusually weak or tired Side effects that usually do not require medical attention (report to your doctor or health care professional if they continue or are bothersome): -diarrhea -fever, chills (flu-like symptoms) -headaches -nausea, vomiting -redness, stinging, or swelling at site where injected This list may not describe all possible side effects. Call your doctor for medical advice about side effects. You may report side effects to FDA at 1-800-FDA-1088. Where should I keep my medicine? Keep out of the reach of children. Store in a refrigerator between 2 and 8 degrees C (36 and 46 degrees F). Do not freeze. Do not shake. Throw away any unused portion if using a single-dose vial. Throw away any unused medicine after the expiration date. NOTE: This sheet is a summary. It may not cover all possible information. If you have questions about this medicine, talk to your doctor, pharmacist, or health care provider.    2016, Elsevier/Gold Standard. (2008-11-15 10:23:57)  

## 2016-07-22 ENCOUNTER — Encounter (HOSPITAL_COMMUNITY)
Admission: RE | Disposition: A | Discharge status: Home / Self Care | Payer: Self-pay | Source: Ambulatory Visit | Attending: Gastroenterology

## 2016-07-22 ENCOUNTER — Encounter (HOSPITAL_COMMUNITY): Payer: Self-pay

## 2016-07-22 ENCOUNTER — Ambulatory Visit (HOSPITAL_COMMUNITY)
Admission: RE | Admit: 2016-07-22 | Discharge: 2016-07-22 | Disposition: A | Discharge status: Home / Self Care | Payer: 59 | Source: Ambulatory Visit | Attending: Gastroenterology | Admitting: Gastroenterology

## 2016-07-22 ENCOUNTER — Other Ambulatory Visit: Payer: Self-pay | Admitting: Family

## 2016-07-22 DIAGNOSIS — Z79899 Other long term (current) drug therapy: Secondary | ICD-10-CM | POA: Insufficient documentation

## 2016-07-22 DIAGNOSIS — I10 Essential (primary) hypertension: Secondary | ICD-10-CM | POA: Diagnosis not present

## 2016-07-22 DIAGNOSIS — R112 Nausea with vomiting, unspecified: Secondary | ICD-10-CM | POA: Insufficient documentation

## 2016-07-22 DIAGNOSIS — D649 Anemia, unspecified: Secondary | ICD-10-CM | POA: Diagnosis not present

## 2016-07-22 DIAGNOSIS — R109 Unspecified abdominal pain: Secondary | ICD-10-CM | POA: Insufficient documentation

## 2016-07-22 DIAGNOSIS — E785 Hyperlipidemia, unspecified: Secondary | ICD-10-CM | POA: Insufficient documentation

## 2016-07-22 DIAGNOSIS — R634 Abnormal weight loss: Secondary | ICD-10-CM | POA: Insufficient documentation

## 2016-07-22 HISTORY — PX: GIVENS CAPSULE STUDY: SHX5432

## 2016-07-22 SURGERY — IMAGING PROCEDURE, GI TRACT, INTRALUMINAL, VIA CAPSULE
Anesthesia: LOCAL

## 2016-07-22 SURGICAL SUPPLY — 1 items: TOWEL COTTON PACK 4EA (MISCELLANEOUS) ×4 IMPLANT

## 2016-07-22 NOTE — Progress Notes (Signed)
Pt arrived at 8:32.  Informed pt about procedure and provided instructions to follow after capsule is ingested.  Pt swallowed capsule at 8:52am.  Instructed to come back to hospital with supplies when study complete.

## 2016-07-23 ENCOUNTER — Encounter (HOSPITAL_COMMUNITY): Payer: Self-pay | Admitting: Gastroenterology

## 2016-07-24 NOTE — H&P (Signed)
  [name] HPI: The patient was previously identified to have a jejunitis.  Despite treatment and endoscopic evaluation she continues with severe nausea and vomiting, which has resulted in weight loss.  A second opinion consultation was obtained from [facility] and a colonoscopy as well as a capsule endoscopy was recommended.  She desires to undergo the capsule endoscopy here in [place], but she wants [facility] to perform the colonoscopy.  Past Medical History:  Diagnosis Date  . Abdominal pain    persistent  . Alpha thalassemia minor trait 06/26/2016  . 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 Hyperlipidemia   . Hypertension     Past Surgical History:  Procedure Laterality Date  . CHOLECYSTECTOMY N/A 05/21/2016   Procedure: LAPAROSCOPIC CHOLECYSTECTOMY;  Surgeon: Erroll Luna, MD;  Location: [place];  Service: General;  Laterality: N/A;  . COLONOSCOPY    . GIVENS CAPSULE STUDY N/A 07/22/2016   Procedure: GIVENS CAPSULE STUDY;  Surgeon: Carol Ada, MD;  Location: [place];  Service: Endoscopy;  Laterality: N/A;    Family History  Problem Relation Age of Onset  . Stroke Mother   . Hyperlipidemia Mother   . Diabetes Mother     Social History:  reports that she has never smoked. She has never used smokeless tobacco. She reports that she does not drink alcohol or use drugs.  Allergies:  Allergies  Allergen Reactions  . Isovue [Iopamidol] Hives    Pt broke out in several facial hives, one on her back.  She will need full premeds in the future.  J Bohm No allergy demonstrated to  Orally ingested Iodinated agent. (Gastrographin CT Scan 05-12-16) Pt broke out in several facial hives, one on her back.  She will need full premeds in the future.  J Bohm    Medications: Scheduled: Continuous:  No results found for this or any previous visit (from the past 24 hour(s)).   No results found.  ROS:  As stated above in the HPI otherwise negative.  Height 5\' 4"  (1.626  m), weight 58.5 kg (129 lb).    PE: Nursing procedure.  Assessment/Plan: 1) Capsule endoscopy.  Mira Balon D 07/24/2016, 8:35 AM

## 2016-07-26 ENCOUNTER — Other Ambulatory Visit: Payer: Self-pay | Admitting: Family

## 2016-07-31 ENCOUNTER — Ambulatory Visit: Payer: 59

## 2016-07-31 ENCOUNTER — Other Ambulatory Visit: Payer: 59

## 2016-08-05 ENCOUNTER — Other Ambulatory Visit: Payer: 59

## 2016-08-05 ENCOUNTER — Ambulatory Visit: Payer: 59

## 2016-08-14 DIAGNOSIS — D500 Iron deficiency anemia secondary to blood loss (chronic): Secondary | ICD-10-CM | POA: Insufficient documentation

## 2016-08-20 ENCOUNTER — Encounter: Payer: Self-pay | Admitting: Hematology & Oncology

## 2016-08-21 ENCOUNTER — Other Ambulatory Visit: Payer: 59

## 2016-08-21 ENCOUNTER — Ambulatory Visit: Payer: 59

## 2016-08-26 ENCOUNTER — Encounter: Payer: Self-pay | Admitting: Oncology

## 2016-08-26 ENCOUNTER — Ambulatory Visit: Payer: 59

## 2016-08-26 ENCOUNTER — Other Ambulatory Visit: Payer: 59

## 2016-08-26 ENCOUNTER — Ambulatory Visit (HOSPITAL_BASED_OUTPATIENT_CLINIC_OR_DEPARTMENT_OTHER): Payer: 59 | Admitting: Family

## 2016-08-26 ENCOUNTER — Other Ambulatory Visit (HOSPITAL_BASED_OUTPATIENT_CLINIC_OR_DEPARTMENT_OTHER): Payer: 59

## 2016-08-26 ENCOUNTER — Encounter: Payer: Self-pay | Admitting: Family

## 2016-08-26 VITALS — BP 122/88 | HR 85 | Temp 97.7°F | Resp 16 | Ht 64.0 in | Wt 130.0 lb

## 2016-08-26 DIAGNOSIS — D631 Anemia in chronic kidney disease: Secondary | ICD-10-CM

## 2016-08-26 DIAGNOSIS — D563 Thalassemia minor: Secondary | ICD-10-CM

## 2016-08-26 DIAGNOSIS — N189 Chronic kidney disease, unspecified: Secondary | ICD-10-CM

## 2016-08-26 DIAGNOSIS — D560 Alpha thalassemia: Secondary | ICD-10-CM | POA: Diagnosis not present

## 2016-08-26 DIAGNOSIS — D509 Iron deficiency anemia, unspecified: Secondary | ICD-10-CM

## 2016-08-26 LAB — CBC WITH DIFFERENTIAL (CANCER CENTER ONLY)
BASO#: 0 10*3/uL (ref 0.0–0.2)
BASO%: 0.4 % (ref 0.0–2.0)
EOS ABS: 0.2 10*3/uL (ref 0.0–0.5)
EOS%: 3.2 % (ref 0.0–7.0)
HEMATOCRIT: 37.1 % (ref 34.8–46.6)
HEMOGLOBIN: 12 g/dL (ref 11.6–15.9)
LYMPH#: 2 10*3/uL (ref 0.9–3.3)
LYMPH%: 39.9 % (ref 14.0–48.0)
MCH: 23.4 pg — AB (ref 26.0–34.0)
MCHC: 32.3 g/dL (ref 32.0–36.0)
MCV: 72 fL — ABNORMAL LOW (ref 81–101)
MONO#: 0.3 10*3/uL (ref 0.1–0.9)
MONO%: 5.7 % (ref 0.0–13.0)
NEUT%: 50.8 % (ref 39.6–80.0)
NEUTROS ABS: 2.6 10*3/uL (ref 1.5–6.5)
Platelets: 215 10*3/uL (ref 145–400)
RBC: 5.13 10*6/uL (ref 3.70–5.32)
RDW: 14.7 % (ref 11.1–15.7)
WBC: 5.1 10*3/uL (ref 3.9–10.0)

## 2016-08-26 LAB — COMPREHENSIVE METABOLIC PANEL
ALBUMIN: 3.9 g/dL (ref 3.5–5.0)
ALK PHOS: 88 U/L (ref 40–150)
ALT: 10 U/L (ref 0–55)
ANION GAP: 10 meq/L (ref 3–11)
AST: 36 U/L — ABNORMAL HIGH (ref 5–34)
BILIRUBIN TOTAL: 0.52 mg/dL (ref 0.20–1.20)
BUN: 9.3 mg/dL (ref 7.0–26.0)
CO2: 28 mEq/L (ref 22–29)
CREATININE: 1 mg/dL (ref 0.6–1.1)
Calcium: 9.9 mg/dL (ref 8.4–10.4)
Chloride: 104 mEq/L (ref 98–109)
EGFR: 66 mL/min/{1.73_m2} — AB (ref >90)
GLUCOSE: 66 mg/dL — AB (ref 70–140)
Potassium: 3 mEq/L — CL (ref 3.5–5.1)
SODIUM: 142 meq/L (ref 136–145)
TOTAL PROTEIN: 8.7 g/dL — AB (ref 6.4–8.3)

## 2016-08-26 LAB — CHCC SATELLITE - SMEAR

## 2016-08-26 MED ORDER — FOLIC ACID 1 MG PO TABS: 2.0000 mg | ORAL_TABLET | Freq: Every day | ORAL | 4 refills | Status: DC

## 2016-08-26 NOTE — Progress Notes (Signed)
Hematology and Oncology Follow Up Visit  [name] [id number] 1956-05-16 60 y.o. 08/26/2016   Principle Diagnosis:  Erythropoietin deficiency anemia Alpha-Thalassemia   Current Therapy:   Aranesp 300 mcg SQ for Hgb < 11 Folic acid 1 mg PO daily     Interim History:  [name] is here today with her son for a follow-up. She is doing well and feels that her energy is improving since receiving the Aranesp at her last visit. Her Hgb is now 12.0. Her MCV is still low at 72. She has been taking folic acid 1 mg daily.  No fever, chills, n/v, cough, rash, dizziness, SOB, chest pain, palpitations, abdominal pain or changes in bowel or bladder habits.  No lymphadenopathy found on exam. No bleeding, bruising or petechiae.  No swelling, tenderness, numbness or tingling in her extremities. No c/o joint aches or bone pain.  She still does not have a good appetite but is eating anyway. She states that she could be drinking more water. Her weight is stable at 130 lbs.   Medications:    Medication List       Accurate as of 08/26/16 11:34 AM. Always use your most recent med list.          amLODipine 10 MG tablet Commonly known as:  NORVASC Take 1 tablet (10 mg total) by mouth daily.   atorvastatin 40 MG tablet Commonly known as:  LIPITOR Take 1 tablet (40 mg total) by mouth daily.   folic acid 1 MG tablet Commonly known as:  FOLVITE Take 1 tablet (1 mg total) by mouth daily.   potassium chloride 10 MEQ tablet Commonly known as:  K-DUR,KLOR-CON Take 1 tablet (10 mEq total) by mouth 2 (two) times daily.       Allergies:  Allergies  Allergen Reactions  . Isovue [Iopamidol] Hives    Pt broke out in several facial hives, one on her back.  She will need full premeds in the future.  [name] No allergy demonstrated to  Orally ingested Iodinated agent. (Gastrographin CT Scan 05-12-16) Pt broke out in several facial hives, one on her back.  She will need full premeds in the future.  [name]    Past Medical History, Surgical history, Social history, and Family History were reviewed and updated.  Review of Systems: All other 10 point review of systems is negative.   Physical Exam:  height is 5\' 4"  (1.626 m) and weight is 130 lb (59 kg). Her temperature is 97.7 F (36.5 C). Her blood pressure is 122/88 and her pulse is 85. Her respiration is 16.   Wt Readings from Last 3 Encounters:  08/26/16 130 lb (59 kg)  07/22/16 129 lb (58.5 kg)  07/15/16 129 lb 1.9 oz (58.6 kg)    Ocular: Sclerae unicteric, pupils equal, round and reactive to light Ear-nose-throat: Oropharynx clear, dentition fair Lymphatic: No cervical supraclavicular or axillary adenopathy Lungs no rales or rhonchi, good excursion bilaterally Heart regular rate and rhythm, no murmur appreciated Abd soft, nontender, positive bowel sounds, no liver or spleen tip palpated on exam, no fluid wave MSK no focal spinal tenderness, no joint edema Neuro: non-focal, well-oriented, appropriate affect Breasts: Deferred  Lab Results  Component Value Date   WBC 5.1 08/26/2016   HGB 12.0 08/26/2016   HCT 37.1 08/26/2016   MCV 72 (L) 08/26/2016   PLT 215 08/26/2016   Lab Results  Component Value Date   FERRITIN 130 06/24/2016   IRON 63 06/24/2016  TIBC 203 (L) 06/24/2016   UIBC 139 06/24/2016   IRONPCTSAT 31 06/24/2016   Lab Results  Component Value Date   RETICCTPCT <0.4 (L) 05/20/2016   RBC 5.13 08/26/2016   No results found for: KPAFRELGTCHN, LAMBDASER, KAPLAMBRATIO No results found for: IGGSERUM, IGA, IGMSERUM No results found for: [name], SPEI   Chemistry      Component Value Date/Time   NA 141 07/15/2016 1125   K 3.6 07/15/2016 1125   CL 104 06/10/2016 1509   CO2 30 (H) 07/15/2016 1125   BUN 5.9 (L) 07/15/2016 1125   CREATININE 1.1 07/15/2016 1125      Component Value Date/Time   CALCIUM 9.7 07/15/2016 1125   ALKPHOS 72 07/15/2016  1125   AST 30 07/15/2016 1125   ALT <9 07/15/2016 1125   BILITOT 0.51 07/15/2016 1125     Impression and Plan: [name] is a very pleasant 60 yo African American female with history of erythropoietin deficiency anemia and alpha thalassemia. She had a nice response to Aranesp and her Hgb is now 12.0. No Aranesp injection needed today.  Her MCV however is still low at 72 so we will increase her Folic acid to 2 mg daily.  We will plan to see her back in 6 weeks for repeat lab work and follow-up.  Both she and her son know to contact our office with any questions or concerns. We can certainly see her sooner if need be.   Lori Bottom, NP 9/11/201711:34 AM

## 2016-09-11 ENCOUNTER — Other Ambulatory Visit: Payer: 59

## 2016-09-11 ENCOUNTER — Ambulatory Visit: Payer: 59

## 2016-09-16 ENCOUNTER — Ambulatory Visit (HOSPITAL_BASED_OUTPATIENT_CLINIC_OR_DEPARTMENT_OTHER): Payer: 59

## 2016-09-16 ENCOUNTER — Other Ambulatory Visit (HOSPITAL_BASED_OUTPATIENT_CLINIC_OR_DEPARTMENT_OTHER): Payer: 59

## 2016-09-16 VITALS — BP 127/81 | HR 75 | Temp 98.0°F | Resp 14

## 2016-09-16 DIAGNOSIS — D631 Anemia in chronic kidney disease: Secondary | ICD-10-CM

## 2016-09-16 DIAGNOSIS — N189 Chronic kidney disease, unspecified: Secondary | ICD-10-CM

## 2016-09-16 LAB — CBC WITH DIFFERENTIAL (CANCER CENTER ONLY)
BASO#: 0 10*3/uL (ref 0.0–0.2)
BASO%: 0.6 % (ref 0.0–2.0)
EOS%: 7.2 % — AB (ref 0.0–7.0)
Eosinophils Absolute: 0.4 10*3/uL (ref 0.0–0.5)
HEMATOCRIT: 32.6 % — AB (ref 34.8–46.6)
HEMOGLOBIN: 10.6 g/dL — AB (ref 11.6–15.9)
LYMPH#: 2.1 10*3/uL (ref 0.9–3.3)
LYMPH%: 39.3 % (ref 14.0–48.0)
MCH: 23.4 pg — ABNORMAL LOW (ref 26.0–34.0)
MCHC: 32.5 g/dL (ref 32.0–36.0)
MCV: 72 fL — ABNORMAL LOW (ref 81–101)
MONO#: 0.5 10*3/uL (ref 0.1–0.9)
MONO%: 8.3 % (ref 0.0–13.0)
NEUT%: 44.6 % (ref 39.6–80.0)
NEUTROS ABS: 2.4 10*3/uL (ref 1.5–6.5)
Platelets: 242 10*3/uL (ref 145–400)
RBC: 4.53 10*6/uL (ref 3.70–5.32)
RDW: 14.9 % (ref 11.1–15.7)
WBC: 5.5 10*3/uL (ref 3.9–10.0)

## 2016-09-16 MED ORDER — DARBEPOETIN ALFA 300 MCG/0.6ML IJ SOSY
PREFILLED_SYRINGE | INTRAMUSCULAR | Status: AC
  Filled 2016-09-16: qty 0.6

## 2016-09-16 MED ORDER — DARBEPOETIN ALFA 300 MCG/0.6ML IJ SOSY
300.0000 ug | PREFILLED_SYRINGE | Freq: Once | INTRAMUSCULAR | Status: AC
  Administered 2016-09-16: 300 ug via SUBCUTANEOUS

## 2016-09-16 NOTE — Patient Instructions (Signed)
Darbepoetin Alfa injection What is this medicine? DARBEPOETIN ALFA (dar be POE e tin AL fa) helps your body make more red blood cells. It is used to treat anemia caused by chronic kidney failure and chemotherapy. This medicine may be used for other purposes; ask your health care provider or pharmacist if you have questions. What should I tell my health care provider before I take this medicine? They need to know if you have any of these conditions: -blood clotting disorders or history of blood clots -cancer patient not on chemotherapy -cystic fibrosis -heart disease, such as angina, heart failure, or a history of a heart attack -hemoglobin level of 12 g/dL or greater -high blood pressure -low levels of folate, iron, or vitamin B12 -seizures -an unusual or allergic reaction to darbepoetin, erythropoietin, albumin, hamster proteins, latex, other medicines, foods, dyes, or preservatives -pregnant or trying to get pregnant -breast-feeding How should I use this medicine? This medicine is for injection into a vein or under the skin. It is usually given by a health care professional in a hospital or clinic setting. If you get this medicine at home, you will be taught how to prepare and give this medicine. Do not shake the solution before you withdraw a dose. Use exactly as directed. Take your medicine at regular intervals. Do not take your medicine more often than directed. It is important that you put your used needles and syringes in a special sharps container. Do not put them in a trash can. If you do not have a sharps container, call your pharmacist or healthcare provider to get one. Talk to your pediatrician regarding the use of this medicine in children. While this medicine may be used in children as young as 1 year for selected conditions, precautions do apply. Overdosage: If you think you have taken too much of this medicine contact a poison control center or emergency room at once. NOTE:  This medicine is only for you. Do not share this medicine with others. What if I miss a dose? If you miss a dose, take it as soon as you can. If it is almost time for your next dose, take only that dose. Do not take double or extra doses. What may interact with this medicine? Do not take this medicine with any of the following medications: -epoetin alfa This list may not describe all possible interactions. Give your health care provider a list of all the medicines, herbs, non-prescription drugs, or dietary supplements you use. Also tell them if you smoke, drink alcohol, or use illegal drugs. Some items may interact with your medicine. What should I watch for while using this medicine? Visit your prescriber or health care professional for regular checks on your progress and for the needed blood tests and blood pressure measurements. It is especially important for the doctor to make sure your hemoglobin level is in the desired range, to limit the risk of potential side effects and to give you the best benefit. Keep all appointments for any recommended tests. Check your blood pressure as directed. Ask your doctor what your blood pressure should be and when you should contact him or her. As your body makes more red blood cells, you may need to take iron, folic acid, or vitamin B supplements. Ask your doctor or health care provider which products are right for you. If you have kidney disease continue dietary restrictions, even though this medication can make you feel better. Talk with your doctor or health care professional about the   foods you eat and the vitamins that you take. What side effects may I notice from receiving this medicine? Side effects that you should report to your doctor or health care professional as soon as possible: -allergic reactions like skin rash, itching or hives, swelling of the face, lips, or tongue -breathing problems -changes in vision -chest pain -confusion, trouble speaking  or understanding -feeling faint or lightheaded, falls -high blood pressure -muscle aches or pains -pain, swelling, warmth in the leg -rapid weight gain -severe headaches -sudden numbness or weakness of the face, arm or leg -trouble walking, dizziness, loss of balance or coordination -seizures (convulsions) -swelling of the ankles, feet, hands -unusually weak or tired Side effects that usually do not require medical attention (report to your doctor or health care professional if they continue or are bothersome): -diarrhea -fever, chills (flu-like symptoms) -headaches -nausea, vomiting -redness, stinging, or swelling at site where injected This list may not describe all possible side effects. Call your doctor for medical advice about side effects. You may report side effects to FDA at 1-800-FDA-1088. Where should I keep my medicine? Keep out of the reach of children. Store in a refrigerator between 2 and 8 degrees C (36 and 46 degrees F). Do not freeze. Do not shake. Throw away any unused portion if using a single-dose vial. Throw away any unused medicine after the expiration date. NOTE: This sheet is a summary. It may not cover all possible information. If you have questions about this medicine, talk to your doctor, pharmacist, or health care provider.    2016, Elsevier/Gold Standard. (2008-11-15 10:23:57)  

## 2016-10-01 LAB — HM COLONOSCOPY

## 2016-10-04 ENCOUNTER — Encounter: Payer: Self-pay | Admitting: Sports Medicine

## 2016-10-07 ENCOUNTER — Ambulatory Visit: Payer: 59

## 2016-10-07 ENCOUNTER — Other Ambulatory Visit (HOSPITAL_BASED_OUTPATIENT_CLINIC_OR_DEPARTMENT_OTHER): Payer: 59

## 2016-10-07 DIAGNOSIS — N189 Chronic kidney disease, unspecified: Secondary | ICD-10-CM | POA: Diagnosis not present

## 2016-10-07 DIAGNOSIS — D509 Iron deficiency anemia, unspecified: Secondary | ICD-10-CM

## 2016-10-07 DIAGNOSIS — D630 Anemia in neoplastic disease: Secondary | ICD-10-CM | POA: Diagnosis not present

## 2016-10-07 DIAGNOSIS — D560 Alpha thalassemia: Secondary | ICD-10-CM | POA: Diagnosis not present

## 2016-10-07 DIAGNOSIS — D631 Anemia in chronic kidney disease: Secondary | ICD-10-CM

## 2016-10-07 DIAGNOSIS — D563 Thalassemia minor: Secondary | ICD-10-CM

## 2016-10-07 LAB — CBC WITH DIFFERENTIAL (CANCER CENTER ONLY)
BASO#: 0 10*3/uL (ref 0.0–0.2)
BASO%: 0.9 % (ref 0.0–2.0)
EOS%: 4.7 % (ref 0.0–7.0)
Eosinophils Absolute: 0.2 10*3/uL (ref 0.0–0.5)
HEMATOCRIT: 36.7 % (ref 34.8–46.6)
HGB: 11.6 g/dL (ref 11.6–15.9)
LYMPH#: 2 10*3/uL (ref 0.9–3.3)
LYMPH%: 45.2 % (ref 14.0–48.0)
MCH: 23 pg — ABNORMAL LOW (ref 26.0–34.0)
MCHC: 31.6 g/dL — AB (ref 32.0–36.0)
MCV: 73 fL — AB (ref 81–101)
MONO#: 0.3 10*3/uL (ref 0.1–0.9)
MONO%: 7.6 % (ref 0.0–13.0)
NEUT#: 1.9 10*3/uL (ref 1.5–6.5)
NEUT%: 41.6 % (ref 39.6–80.0)
PLATELETS: 238 10*3/uL (ref 145–400)
RBC: 5.05 10*6/uL (ref 3.70–5.32)
RDW: 16.6 % — AB (ref 11.1–15.7)
WBC: 4.5 10*3/uL (ref 3.9–10.0)

## 2016-10-07 LAB — COMPREHENSIVE METABOLIC PANEL
ALT: 9 U/L (ref 0–55)
ANION GAP: 8 meq/L (ref 3–11)
AST: 28 U/L (ref 5–34)
Albumin: 3.7 g/dL (ref 3.5–5.0)
Alkaline Phosphatase: 86 U/L (ref 40–150)
BUN: 15.1 mg/dL (ref 7.0–26.0)
CALCIUM: 9.6 mg/dL (ref 8.4–10.4)
CHLORIDE: 104 meq/L (ref 98–109)
CO2: 28 meq/L (ref 22–29)
Creatinine: 1.1 mg/dL (ref 0.6–1.1)
EGFR: 61 mL/min/{1.73_m2} — ABNORMAL LOW (ref >90)
Glucose: 65 mg/dl — ABNORMAL LOW (ref 70–140)
POTASSIUM: 3.7 meq/L (ref 3.5–5.1)
Sodium: 141 mEq/L (ref 136–145)
Total Bilirubin: 0.65 mg/dL (ref 0.20–1.20)
Total Protein: 7.9 g/dL (ref 6.4–8.3)

## 2016-10-07 LAB — IRON AND TIBC
%SAT: 45 % (ref 21–57)
IRON: 104 ug/dL (ref 41–142)
TIBC: 233 ug/dL — AB (ref 236–444)
UIBC: 129 ug/dL (ref 120–384)

## 2016-10-07 LAB — FERRITIN: FERRITIN: 33 ng/mL (ref 9–269)

## 2016-10-08 ENCOUNTER — Telehealth: Payer: Self-pay | Admitting: *Deleted

## 2016-10-08 LAB — ERYTHROPOIETIN: ERYTHROPOIETIN: 23.1 m[IU]/mL — AB (ref 2.6–18.5)

## 2016-10-08 LAB — RETICULOCYTES: Reticulocyte Count: 1.1 % (ref 0.6–2.6)

## 2016-10-08 NOTE — Telephone Encounter (Addendum)
Patient aware of results  ----- Message from Eliezer Bottom, NP sent at 10/07/2016  4:02 PM EDT ----- Regarding: Iron Iron studies look good. No infusion needed at this time. Thank you!  Sarah  ----- Message ----- From: Interface, Lab In Three Zero One Sent: 10/07/2016  12:13 PM To: Eliezer Bottom, NP

## 2016-10-25 ENCOUNTER — Other Ambulatory Visit: Payer: Self-pay | Admitting: *Deleted

## 2016-10-25 DIAGNOSIS — D563 Thalassemia minor: Secondary | ICD-10-CM

## 2016-10-25 DIAGNOSIS — D631 Anemia in chronic kidney disease: Secondary | ICD-10-CM

## 2016-10-28 ENCOUNTER — Other Ambulatory Visit (HOSPITAL_BASED_OUTPATIENT_CLINIC_OR_DEPARTMENT_OTHER): Payer: 59

## 2016-10-28 ENCOUNTER — Encounter: Payer: Self-pay | Admitting: Hematology & Oncology

## 2016-10-28 ENCOUNTER — Ambulatory Visit: Payer: 59

## 2016-10-28 ENCOUNTER — Ambulatory Visit (HOSPITAL_BASED_OUTPATIENT_CLINIC_OR_DEPARTMENT_OTHER): Payer: 59 | Admitting: Hematology & Oncology

## 2016-10-28 VITALS — BP 161/88 | HR 64 | Temp 97.8°F | Wt 130.1 lb

## 2016-10-28 DIAGNOSIS — D563 Thalassemia minor: Secondary | ICD-10-CM

## 2016-10-28 DIAGNOSIS — D631 Anemia in chronic kidney disease: Secondary | ICD-10-CM | POA: Diagnosis not present

## 2016-10-28 DIAGNOSIS — N189 Chronic kidney disease, unspecified: Secondary | ICD-10-CM

## 2016-10-28 DIAGNOSIS — D560 Alpha thalassemia: Secondary | ICD-10-CM

## 2016-10-28 LAB — COMPREHENSIVE METABOLIC PANEL
ALT: 9 U/L (ref 0–55)
ANION GAP: 10 meq/L (ref 3–11)
AST: 32 U/L (ref 5–34)
Albumin: 3.6 g/dL (ref 3.5–5.0)
Alkaline Phosphatase: 88 U/L (ref 40–150)
BUN: 12.4 mg/dL (ref 7.0–26.0)
CALCIUM: 9.6 mg/dL (ref 8.4–10.4)
CHLORIDE: 104 meq/L (ref 98–109)
CO2: 26 mEq/L (ref 22–29)
CREATININE: 1 mg/dL (ref 0.6–1.1)
EGFR: 67 mL/min/{1.73_m2} — ABNORMAL LOW (ref >90)
Glucose: 72 mg/dl (ref 70–140)
POTASSIUM: 3.3 meq/L — AB (ref 3.5–5.1)
Sodium: 140 mEq/L (ref 136–145)
Total Bilirubin: 0.55 mg/dL (ref 0.20–1.20)
Total Protein: 7.9 g/dL (ref 6.4–8.3)

## 2016-10-28 LAB — CBC WITH DIFFERENTIAL (CANCER CENTER ONLY)
BASO#: 0 10*3/uL (ref 0.0–0.2)
BASO%: 0.6 % (ref 0.0–2.0)
EOS ABS: 0.2 10*3/uL (ref 0.0–0.5)
EOS%: 3.8 % (ref 0.0–7.0)
HEMATOCRIT: 32.6 % — AB (ref 34.8–46.6)
HGB: 10.5 g/dL — ABNORMAL LOW (ref 11.6–15.9)
LYMPH#: 2.3 10*3/uL (ref 0.9–3.3)
LYMPH%: 46.1 % (ref 14.0–48.0)
MCH: 23.3 pg — AB (ref 26.0–34.0)
MCHC: 32.2 g/dL (ref 32.0–36.0)
MCV: 72 fL — AB (ref 81–101)
MONO#: 0.5 10*3/uL (ref 0.1–0.9)
MONO%: 9.1 % (ref 0.0–13.0)
NEUT#: 2 10*3/uL (ref 1.5–6.5)
NEUT%: 40.4 % (ref 39.6–80.0)
Platelets: 204 10*3/uL (ref 145–400)
RBC: 4.51 10*6/uL (ref 3.70–5.32)
RDW: 15.6 % (ref 11.1–15.7)
WBC: 5.1 10*3/uL (ref 3.9–10.0)

## 2016-10-29 LAB — IRON AND TIBC
%SAT: 41 % (ref 21–57)
Iron: 89 ug/dL (ref 41–142)
TIBC: 219 ug/dL — ABNORMAL LOW (ref 236–444)
UIBC: 129 ug/dL (ref 120–384)

## 2016-10-29 LAB — RETICULOCYTES: Reticulocyte Count: 0.4 % — ABNORMAL LOW (ref 0.6–2.6)

## 2016-10-29 LAB — FERRITIN: Ferritin: 58 ng/ml (ref 9–269)

## 2016-10-29 NOTE — Progress Notes (Signed)
Hematology and Oncology Follow Up Visit  [name] [id number] 1956/03/24 60 y.o. 10/29/2016   Principle Diagnosis:  Erythropoietin deficiency anemia Alpha-Thalassemia   Current Therapy:   Aranesp 300 mcg SQ for Hgb < 11 Folic acid 1 mg PO daily     Interim History:  [name] is here today  for a follow-up. She is doing okay. She has had no specific complaints.  Back in October, her iron studies showed ferritin of 33 with an iron saturation of 45%.  She does well with Aranesp. She generally responds nicely.  She's had no issues with bleeding or bruising.  Her appetite is good. She's had no nausea or vomiting.  This been no leg swelling. She's had no rashes.  His been no change in her medications.   Overall, her performance status is ECOG 1. Medications:    Medication List       Accurate as of 10/28/16 11:59 PM. Always use your most recent med list.          amLODipine 10 MG tablet Commonly known as:  NORVASC Take 1 tablet (10 mg total) by mouth daily.   atorvastatin 40 MG tablet Commonly known as:  LIPITOR Take 1 tablet (40 mg total) by mouth daily.   folic acid 1 MG tablet Commonly known as:  FOLVITE Take 2 tablets (2 mg total) by mouth daily.   Magnesium 100 MG Caps Take 1 tablet by mouth daily after breakfast.       Allergies:  Allergies  Allergen Reactions  . Isovue [Iopamidol] Hives    Pt broke out in several facial hives, one on her back.  She will need full premeds in the future.  [name] No allergy demonstrated to  Orally ingested Iodinated agent. (Gastrographin CT Scan 05-12-16) Pt broke out in several facial hives, one on her back.  She will need full premeds in the future.  [name]    Past Medical History, Surgical history, Social history, and Family History were reviewed and updated.  Review of Systems: All other 10 point review of systems is negative.   Physical Exam:  weight is 130 lb 1.9 oz (59 kg). Her oral temperature is  97.8 F (36.6 C). Her blood pressure is 161/88 (abnormal) and her pulse is 64.   Wt Readings from Last 3 Encounters:  10/28/16 130 lb 1.9 oz (59 kg)  08/26/16 130 lb (59 kg)  07/22/16 129 lb (58.5 kg)    Ocular: Sclerae unicteric, pupils equal, round and reactive to light Ear-nose-throat: Oropharynx clear, dentition fair Lymphatic: No cervical supraclavicular or axillary adenopathy Lungs no rales or rhonchi, good excursion bilaterally Heart regular rate and rhythm, no murmur appreciated Abd soft, nontender, positive bowel sounds, no liver or spleen tip palpated on exam, no fluid wave MSK no focal spinal tenderness, no joint edema Neuro: non-focal, well-oriented, appropriate affect Breasts: Deferred  Lab Results  Component Value Date   WBC 5.1 10/28/2016   HGB 10.5 (L) 10/28/2016   HCT 32.6 (L) 10/28/2016   MCV 72 (L) 10/28/2016   PLT 204 10/28/2016   Lab Results  Component Value Date   FERRITIN 33 10/07/2016   IRON 104 10/07/2016   TIBC 233 (L) 10/07/2016   UIBC 129 10/07/2016   IRONPCTSAT 45 10/07/2016   Lab Results  Component Value Date   RETICCTPCT <0.4 (L) 05/20/2016   RBC 4.51 10/28/2016   No results found for: KPAFRELGTCHN, LAMBDASER, KAPLAMBRATIO No results found for: IGGSERUM, IGA, IGMSERUM No results found  for: [name], A1GS, [name], SPEI   Chemistry      Component Value Date/Time   NA 140 10/28/2016 1127   K 3.3 (L) 10/28/2016 1127   CL 104 06/10/2016 1509   CO2 26 10/28/2016 1127   BUN 12.4 10/28/2016 1127   CREATININE 1.0 10/28/2016 1127      Component Value Date/Time   CALCIUM 9.6 10/28/2016 1127   ALKPHOS 88 10/28/2016 1127   AST 32 10/28/2016 1127   ALT 9 10/28/2016 1127   BILITOT 0.55 10/28/2016 1127     Impression and Plan: [name] is a very pleasant 60 yo African American female with history of erythropoietin deficiency anemia and alpha thalassemia.   She needs Aranesp but her blood  pressures too high for this. As such, we will hold off on Aranesp for right now until her blood pressure is under a little bit better control. She is not symptomatic with the anemia so I feel that we can hold off for right now.  I'll like to get her back in about 2 months. I'm sure that she will definitely need Aranesp at that point. Hopefully, her blood pressure will be under better control. Lori Napoleon, MD 11/14/20177:15 AM

## 2016-12-30 ENCOUNTER — Ambulatory Visit: Payer: 59

## 2016-12-30 ENCOUNTER — Ambulatory Visit: Payer: 59 | Admitting: Hematology & Oncology

## 2016-12-30 ENCOUNTER — Other Ambulatory Visit: Payer: 59

## 2017-01-06 ENCOUNTER — Ambulatory Visit (HOSPITAL_BASED_OUTPATIENT_CLINIC_OR_DEPARTMENT_OTHER): Payer: 59 | Admitting: Family

## 2017-01-06 ENCOUNTER — Ambulatory Visit (HOSPITAL_BASED_OUTPATIENT_CLINIC_OR_DEPARTMENT_OTHER): Payer: 59

## 2017-01-06 ENCOUNTER — Other Ambulatory Visit (HOSPITAL_BASED_OUTPATIENT_CLINIC_OR_DEPARTMENT_OTHER): Payer: 59

## 2017-01-06 VITALS — BP 123/74 | HR 71 | Temp 98.1°F | Resp 16 | Wt 130.0 lb

## 2017-01-06 DIAGNOSIS — D631 Anemia in chronic kidney disease: Secondary | ICD-10-CM | POA: Diagnosis not present

## 2017-01-06 DIAGNOSIS — D563 Thalassemia minor: Secondary | ICD-10-CM

## 2017-01-06 DIAGNOSIS — D560 Alpha thalassemia: Secondary | ICD-10-CM

## 2017-01-06 DIAGNOSIS — N189 Chronic kidney disease, unspecified: Secondary | ICD-10-CM

## 2017-01-06 LAB — CBC WITH DIFFERENTIAL (CANCER CENTER ONLY)
BASO#: 0 10*3/uL (ref 0.0–0.2)
BASO%: 0.7 % (ref 0.0–2.0)
EOS%: 4.1 % (ref 0.0–7.0)
Eosinophils Absolute: 0.2 10*3/uL (ref 0.0–0.5)
HCT: 32.6 % — ABNORMAL LOW (ref 34.8–46.6)
HGB: 10.2 g/dL — ABNORMAL LOW (ref 11.6–15.9)
LYMPH#: 2 10*3/uL (ref 0.9–3.3)
LYMPH%: 44.5 % (ref 14.0–48.0)
MCH: 23.5 pg — ABNORMAL LOW (ref 26.0–34.0)
MCHC: 31.3 g/dL — ABNORMAL LOW (ref 32.0–36.0)
MCV: 75 fL — ABNORMAL LOW (ref 81–101)
MONO#: 0.3 10*3/uL (ref 0.1–0.9)
MONO%: 6.1 % (ref 0.0–13.0)
NEUT#: 2 10*3/uL (ref 1.5–6.5)
NEUT%: 44.6 % (ref 39.6–80.0)
Platelets: 203 10*3/uL (ref 145–400)
RBC: 4.34 10*6/uL (ref 3.70–5.32)
RDW: 15.3 % (ref 11.1–15.7)
WBC: 4.6 10*3/uL (ref 3.9–10.0)

## 2017-01-06 LAB — CHCC SATELLITE - SMEAR

## 2017-01-06 MED ORDER — DARBEPOETIN ALFA 300 MCG/0.6ML IJ SOSY
300.0000 ug | PREFILLED_SYRINGE | Freq: Once | INTRAMUSCULAR | Status: AC
  Administered 2017-01-06: 300 ug via SUBCUTANEOUS

## 2017-01-06 MED ORDER — DARBEPOETIN ALFA 300 MCG/0.6ML IJ SOSY
PREFILLED_SYRINGE | INTRAMUSCULAR | Status: AC
  Filled 2017-01-06: qty 0.6

## 2017-01-06 NOTE — Progress Notes (Signed)
Hematology and Oncology Follow Up Visit  [name] [id number] 05/01/1956 61 y.o. 01/06/2017   Principle Diagnosis:  Erythropoietin deficiency anemia Alpha-Thalassemia   Current Therapy:   Aranesp 300 mcg SQ for Hgb < 11 Folic acid 1 mg PO daily     Interim History:  [name] is here today with her son for a follow-up. She states that she is feeling better and that her energy is improving. Her Hgb is 10.2 with an MCV of 75. She verbalized that she is taking her folic acid 2 mg PO daily.  No fever, chills, n/v, cough, rash, dizziness, SOB, chest pain, palpitations, abdominal pain or changes in bowel or bladder habits.  No lymphadenopathy found on exam. No bleeding, bruising or petechiae.  No swelling, tenderness, numbness or tingling in her extremities. No c/o joint aches or bone pain.   Medications:  Allergies as of 01/06/2017      Reactions   Isovue [iopamidol] Hives   Pt broke out in several facial hives, one on her back.  She will need full premeds in the future.  J Bohm No allergy demonstrated to  Orally ingested Iodinated agent. (Gastrographin CT Scan 05-12-16) Pt broke out in several facial hives, one on her back.  She will need full premeds in the future.  J Bohm      Medication List       Accurate as of 01/06/17 12:24 PM. Always use your most recent med list.          amLODipine 10 MG tablet Commonly known as:  NORVASC Take 1 tablet (10 mg total) by mouth daily.   atorvastatin 40 MG tablet Commonly known as:  LIPITOR Take 1 tablet (40 mg total) by mouth daily.   folic acid 1 MG tablet Commonly known as:  FOLVITE Take 2 tablets (2 mg total) by mouth daily.   Magnesium 100 MG Caps Take 1 tablet by mouth daily after breakfast.       Allergies:  Allergies  Allergen Reactions  . Isovue [Iopamidol] Hives    Pt broke out in several facial hives, one on her back.  She will need full premeds in the future.  J Bohm No allergy demonstrated to  Orally  ingested Iodinated agent. (Gastrographin CT Scan 05-12-16) Pt broke out in several facial hives, one on her back.  She will need full premeds in the future.  Alfonse Alpers    Past Medical History, Surgical history, Social history, and Family History were reviewed and updated.  Review of Systems: All other 10 point review of systems is negative.   Physical Exam:  vitals were not taken for this visit.  Wt Readings from Last 3 Encounters:  10/28/16 130 lb 1.9 oz (59 kg)  08/26/16 130 lb (59 kg)  07/22/16 129 lb (58.5 kg)    Ocular: Sclerae unicteric, pupils equal, round and reactive to light Ear-nose-throat: Oropharynx clear, dentition fair Lymphatic: No cervical supraclavicular or axillary adenopathy Lungs no rales or rhonchi, good excursion bilaterally Heart regular rate and rhythm, no murmur appreciated Abd soft, nontender, positive bowel sounds, no liver or spleen tip palpated on exam, no fluid wave MSK no focal spinal tenderness, no joint edema Neuro: non-focal, well-oriented, appropriate affect Breasts: Deferred  Lab Results  Component Value Date   WBC 4.6 01/06/2017   HGB 10.2 (L) 01/06/2017   HCT 32.6 (L) 01/06/2017   MCV 75 (L) 01/06/2017   PLT 203 01/06/2017   Lab Results  Component Value Date  FERRITIN 58 10/28/2016   IRON 89 10/28/2016   TIBC 219 (L) 10/28/2016   UIBC 129 10/28/2016   IRONPCTSAT 41 10/28/2016   Lab Results  Component Value Date   RETICCTPCT <0.4 (L) 05/20/2016   RBC 4.34 01/06/2017   No results found for: KPAFRELGTCHN, LAMBDASER, KAPLAMBRATIO No results found for: IGGSERUM, IGA, IGMSERUM No results found for: Ronnald Ramp, A1GS, A2GS, Tillman Sers, SPEI   Chemistry      Component Value Date/Time   NA 140 10/28/2016 1127   K 3.3 (L) 10/28/2016 1127   CL 104 06/10/2016 1509   CO2 26 10/28/2016 1127   BUN 12.4 10/28/2016 1127   CREATININE 1.0 10/28/2016 1127      Component Value Date/Time   CALCIUM 9.6  10/28/2016 1127   ALKPHOS 88 10/28/2016 1127   AST 32 10/28/2016 1127   ALT 9 10/28/2016 1127   BILITOT 0.55 10/28/2016 1127     Impression and Plan: [name] is a very pleasant 61 yo African American female with history of erythropoietin deficiency anemia and alpha thalassemia. She has responded nicely to Aranesp. Her counts are stable. Hgb is 10.2 with an MCV of 75. She is doing well and asymptomatic at this time.  She will receive Aranesp today as planned.  We will plan to see her back in another 2 months for repeat lab work and follow-up.  Both she and her son know to contact our office with any questions or concerns. We can certainly see her sooner if need be.   Eliezer Bottom, NP 1/22/201812:24 PM

## 2017-01-06 NOTE — Patient Instructions (Signed)

## 2017-01-07 LAB — IRON AND TIBC
%SAT: 34 % (ref 21–57)
Iron: 78 ug/dL (ref 41–142)
TIBC: 226 ug/dL — AB (ref 236–444)
UIBC: 148 ug/dL (ref 120–384)

## 2017-01-07 LAB — FERRITIN: Ferritin: 89 ng/ml (ref 9–269)

## 2017-01-07 LAB — RETICULOCYTES: Reticulocyte Count: 0.9 % (ref 0.6–2.6)

## 2017-01-13 ENCOUNTER — Ambulatory Visit (INDEPENDENT_AMBULATORY_CARE_PROVIDER_SITE_OTHER): Payer: 59 | Admitting: Sports Medicine

## 2017-01-13 ENCOUNTER — Encounter: Payer: Self-pay | Admitting: Sports Medicine

## 2017-01-13 DIAGNOSIS — Z0000 Encounter for general adult medical examination without abnormal findings: Secondary | ICD-10-CM | POA: Diagnosis not present

## 2017-01-13 DIAGNOSIS — D500 Iron deficiency anemia secondary to blood loss (chronic): Secondary | ICD-10-CM | POA: Diagnosis not present

## 2017-01-13 MED ORDER — AMLODIPINE BESYLATE 10 MG PO TABS: 10.0000 mg | ORAL_TABLET | Freq: Every day | ORAL | 3 refills | Status: DC

## 2017-01-13 NOTE — Assessment & Plan Note (Signed)
Adding anemia panel, reticulocyte counts.

## 2017-01-13 NOTE — Progress Notes (Signed)
  Subjective:    CC: CPE  HPI:  This is a pleasant 61 year old female, she is here for her physical. Really has no complaints. She and her son do not believe in the shingles vaccine so we will exclude her from this. Risks, benefits, alternatives explained, patient verbalizes her understanding.  Past medical history:  Negative.  See flowsheet/record as well for more information.  Surgical history: Negative.  See flowsheet/record as well for more information.  Family history: Negative.  See flowsheet/record as well for more information.  Social history: Negative.  See flowsheet/record as well for more information.  Allergies, and medications have been entered into the medical record, reviewed, and no changes needed.    Review of Systems: No headache, visual changes, nausea, vomiting, diarrhea, constipation, dizziness, abdominal pain, skin rash, fevers, chills, night sweats, swollen lymph nodes, weight loss, chest pain, body aches, joint swelling, muscle aches, shortness of breath, mood changes, visual or auditory hallucinations.  Objective:    General: Well Developed, well nourished, and in no acute distress.  Neuro: Alert and oriented x3, extra-ocular muscles intact, sensation grossly intact. Cranial nerves II through XII are intact, motor, sensory, and coordinative functions are all intact. HEENT: Normocephalic, atraumatic, pupils equal round reactive to light, neck supple, no masses, no lymphadenopathy, thyroid nonpalpable. Oropharynx, nasopharynx, external ear canals are unremarkable. Skin: Warm and dry, no rashes noted.  Cardiac: Regular rate and rhythm, no murmurs rubs or gallops.  Respiratory: Clear to auscultation bilaterally. Not using accessory muscles, speaking in full sentences.  Abdominal: Soft, nontender, nondistended, positive bowel sounds, no masses, no organomegaly.  Musculoskeletal: Shoulder, elbow, wrist, hip, knee, ankle stable, and with full range of motion.  Impression  and Recommendations:    The patient was counselled, risk factors were discussed, anticipatory guidance given.  Annual physical exam Unremarkable physical, ordering routine blood work, referral for mammogram and to Sherlie Ban PA-C for cervical cancer screening. There were some benign-appearing architectural distortions and calcifications on a previous mammogram in 2015, a screening only mammogram was recommended one-year  Iron deficiency anemia due to chronic blood loss Adding anemia panel, reticulocyte counts.

## 2017-01-13 NOTE — Assessment & Plan Note (Addendum)
Unremarkable physical, ordering routine blood work, referral for mammogram and to Sherlie Ban PA-C for cervical cancer screening. There were some benign-appearing architectural distortions and calcifications on a previous mammogram in 2015, a screening only mammogram was recommended one-year

## 2017-01-14 ENCOUNTER — Ambulatory Visit: Payer: 59

## 2017-03-03 ENCOUNTER — Encounter: Payer: Self-pay | Admitting: *Deleted

## 2017-03-03 ENCOUNTER — Ambulatory Visit: Payer: 59

## 2017-03-03 ENCOUNTER — Other Ambulatory Visit (HOSPITAL_BASED_OUTPATIENT_CLINIC_OR_DEPARTMENT_OTHER): Payer: 59

## 2017-03-03 ENCOUNTER — Ambulatory Visit (HOSPITAL_BASED_OUTPATIENT_CLINIC_OR_DEPARTMENT_OTHER): Payer: 59 | Admitting: Hematology & Oncology

## 2017-03-03 ENCOUNTER — Encounter: Payer: Self-pay | Admitting: Hematology & Oncology

## 2017-03-03 VITALS — BP 148/78 | HR 74 | Temp 97.8°F | Resp 18 | Wt 132.0 lb

## 2017-03-03 DIAGNOSIS — D560 Alpha thalassemia: Secondary | ICD-10-CM | POA: Diagnosis not present

## 2017-03-03 DIAGNOSIS — N189 Chronic kidney disease, unspecified: Secondary | ICD-10-CM | POA: Diagnosis not present

## 2017-03-03 DIAGNOSIS — D631 Anemia in chronic kidney disease: Secondary | ICD-10-CM | POA: Diagnosis not present

## 2017-03-03 DIAGNOSIS — D563 Thalassemia minor: Secondary | ICD-10-CM

## 2017-03-03 LAB — CBC WITH DIFFERENTIAL (CANCER CENTER ONLY)
BASO#: 0 10*3/uL (ref 0.0–0.2)
BASO%: 0.4 % (ref 0.0–2.0)
EOS ABS: 0.3 10*3/uL (ref 0.0–0.5)
EOS%: 3.7 % (ref 0.0–7.0)
HCT: 35.5 % (ref 34.8–46.6)
HEMOGLOBIN: 11 g/dL — AB (ref 11.6–15.9)
LYMPH#: 3.1 10*3/uL (ref 0.9–3.3)
LYMPH%: 43.8 % (ref 14.0–48.0)
MCH: 23.6 pg — AB (ref 26.0–34.0)
MCHC: 31 g/dL — AB (ref 32.0–36.0)
MCV: 76 fL — ABNORMAL LOW (ref 81–101)
MONO#: 0.5 10*3/uL (ref 0.1–0.9)
MONO%: 6.5 % (ref 0.0–13.0)
NEUT%: 45.6 % (ref 39.6–80.0)
NEUTROS ABS: 3.2 10*3/uL (ref 1.5–6.5)
PLATELETS: 222 10*3/uL (ref 145–400)
RBC: 4.67 10*6/uL (ref 3.70–5.32)
RDW: 14.3 % (ref 11.1–15.7)
WBC: 7 10*3/uL (ref 3.9–10.0)

## 2017-03-03 LAB — CHCC SATELLITE - SMEAR

## 2017-03-03 NOTE — Progress Notes (Signed)
Hematology and Oncology Follow Up Visit  [name] [id number] 1956-11-12 61 y.o. 03/03/2017   Principle Diagnosis:  Erythropoietin deficiency anemia Alpha-Thalassemia   Current Therapy:   Aranesp 300 mcg SQ for Hgb < 11 Folic acid 1 mg PO daily     Interim History:  [name] is here today  for a follow-up. She is doing okay. She has had no specific complaints.  We last saw her back in November. At that point, she did not get a booster shot. I think her blood pressure was up a little too much.  Her iron studies have been doing pretty well. Her ferritin was 89 with iron saturation of 34%. Her serum iron was 78.   Her appetite is doing okay. She's had no nausea or vomiting.   She's had no problems with bleeding. Has been no change in bowel or bladder habits. She said that she does not feel like she can empty her bladder.   Overall, her performance status is ECOG 1.  Medications:  Allergies as of 03/03/2017      Reactions   Isovue [iopamidol] Hives   Pt broke out in several facial hives, one on her back.  She will need full premeds in the future.  J Bohm No allergy demonstrated to  Orally ingested Iodinated agent. (Gastrographin CT Scan 05-12-16) Pt broke out in several facial hives, one on her back.  She will need full premeds in the future.  J Bohm   Iodine-131 Rash      Medication List       Accurate as of 03/03/17  1:04 PM. Always use your most recent med list.          amLODipine 10 MG tablet Commonly known as:  NORVASC Take 1 tablet (10 mg total) by mouth daily.   atorvastatin 40 MG tablet Commonly known as:  LIPITOR Take 1 tablet (40 mg total) by mouth daily.   folic acid 1 MG tablet Commonly known as:  FOLVITE Take 2 tablets (2 mg total) by mouth daily.   Magnesium 100 MG Caps Take 1 tablet by mouth daily after breakfast.       Allergies:  Allergies  Allergen Reactions  . Isovue [Iopamidol] Hives    Pt broke out in several facial hives, one  on her back.  She will need full premeds in the future.  J Bohm No allergy demonstrated to  Orally ingested Iodinated agent. (Gastrographin CT Scan 05-12-16) Pt broke out in several facial hives, one on her back.  She will need full premeds in the future.  J Bohm  . Iodine-131 Rash    Past Medical History, Surgical history, Social history, and Family History were reviewed and updated.  Review of Systems: All other 10 point review of systems is negative.   Physical Exam:  weight is 132 lb (59.9 kg). Her oral temperature is 97.8 F (36.6 C). Her blood pressure is 148/78 (abnormal) and her pulse is 74. Her respiration is 18 and oxygen saturation is 100%.   Wt Readings from Last 3 Encounters:  03/03/17 132 lb (59.9 kg)  01/13/17 130 lb (59 kg)  01/06/17 130 lb (59 kg)    Ocular: Sclerae unicteric, pupils equal, round and reactive to light Ear-nose-throat: Oropharynx clear, dentition fair Lymphatic: No cervical supraclavicular or axillary adenopathy Lungs no rales or rhonchi, good excursion bilaterally Heart regular rate and rhythm, no murmur appreciated Abd soft, nontender, positive bowel sounds, no liver or spleen tip palpated on exam, no  fluid wave MSK no focal spinal tenderness, no joint edema Neuro: non-focal, well-oriented, appropriate affect Breasts: Deferred  Lab Results  Component Value Date   WBC 7.0 03/03/2017   HGB 11.0 (L) 03/03/2017   HCT 35.5 03/03/2017   MCV 76 (L) 03/03/2017   PLT 222 03/03/2017   Lab Results  Component Value Date   FERRITIN 89 01/06/2017   IRON 78 01/06/2017   TIBC 226 (L) 01/06/2017   UIBC 148 01/06/2017   IRONPCTSAT 34 01/06/2017   Lab Results  Component Value Date   RETICCTPCT <0.4 (L) 05/20/2016   RBC 4.67 03/03/2017   No results found for: KPAFRELGTCHN, LAMBDASER, KAPLAMBRATIO No results found for: IGGSERUM, IGA, IGMSERUM No results found for: Ronnald Ramp, A1GS, Nelida Meuse, SPEI   Chemistry        Component Value Date/Time   NA 140 10/28/2016 1127   K 3.3 (L) 10/28/2016 1127   CL 104 06/10/2016 1509   CO2 26 10/28/2016 1127   BUN 12.4 10/28/2016 1127   CREATININE 1.0 10/28/2016 1127      Component Value Date/Time   CALCIUM 9.6 10/28/2016 1127   ALKPHOS 88 10/28/2016 1127   AST 32 10/28/2016 1127   ALT 9 10/28/2016 1127   BILITOT 0.55 10/28/2016 1127     Impression and Plan: [name] is a very pleasant 61 yo African American female with history of erythropoietin deficiency anemia and alpha thalassemia.   Her blood count is doing pretty well today. As such, she does not need to be given Aranesp.  I do want to get her back in 2 months. I suspect that she probably will need Aranesp at that point.  She is still taking the folic acid which is important.   Volanda Napoleon, MD 3/19/20181:04 PM

## 2017-03-04 LAB — RETICULOCYTES: RETICULOCYTE COUNT: 0.7 % (ref 0.6–2.6)

## 2017-03-05 IMAGING — NM NM HEPATOBILIARY IMAGE, INC GB
2 series · 12 of 12 positions shown · non-contrast
Comparison: Ultrasound and CT Abdomen and Pelvis 05/12/2016

CLINICAL DATA: 60-year-old female with nausea vomiting and right
upper quadrant pain. Abnormal right upper quadrant ultrasound on
05/12/2016 demonstrating possible gallstone and sludge, but no
strong evidence of acute cholecystitis at that time. Initial
encounter.

EXAM:
NUCLEAR MEDICINE HEPATOBILIARY IMAGING
TECHNIQUE: Sequential images of the abdomen were obtained [DATE] minutes
following intravenous administration of radiopharmaceutical.
RADIOPHARMACEUTICALS:  6.4 mCi Ec-BBm  Choletec IV

[Series 1: biliary · 4.14mm/px · 6 of 60 frames shown (1 of 2)]
[frame 6/60]
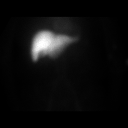
[frame 16/60]
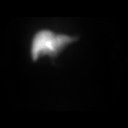
[frame 26/60]
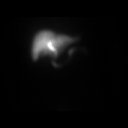
[frame 36/60]
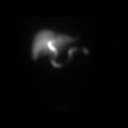
[frame 46/60]
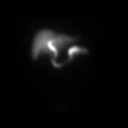
[frame 56/60]
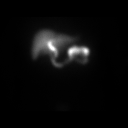

[Series 3: biliary · 4.14mm/px · 6 of 30 frames shown (2 of 2)]
[frame 3/30]
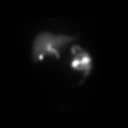
[frame 8/30]
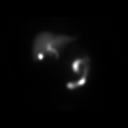
[frame 13/30]
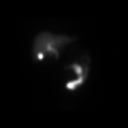
[frame 18/30]
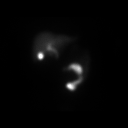
[frame 23/30]
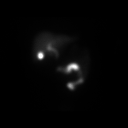
[frame 28/30]
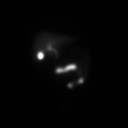

[12 of 12 positions shown; findings below may reference images not displayed]

FINDINGS: Prompt radiotracer uptake by the liver and clearance of the blood
pool. Prompt CBD activity by 20 minutes. Small bowel activity by
25-30 minutes which increases over the first hour of the study. No
gallbladder activity occurred at 1 hour.

The study then was augmented with 2.3 mg IV morphine and additional
imaging performed. There is gallbladder activity on the [DATE] of
the additional images, which increases to the completion of the
exam.
IMPRESSION: 1. Patent cystic duct, but gallbladder activity visualized only
after augmentation with morphine suggesting chronic cholecystitis.
2. Patent CBD.

## 2017-03-08 IMAGING — NM NM HEPATOBILIARY IMAGE, INC GB
2 series · 12 of 12 positions shown · non-contrast
Comparison: 05/20/2016

CLINICAL DATA: Status post cholecystectomy on 05/21/2016, nausea/
vomiting, evaluate for bile leak

EXAM:
NUCLEAR MEDICINE HEPATOBILIARY IMAGING
TECHNIQUE: Sequential images of the abdomen were obtained [DATE] minutes
following intravenous administration of radiopharmaceutical.
RADIOPHARMACEUTICALS:  5.2 mCi Xc-DDm  Choletec IV

[he hepatobiliary · 3.43mm/px · 6 of 60 frames shown (1 of 2)]
[frame 6/60]
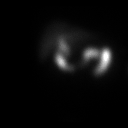
[frame 16/60]
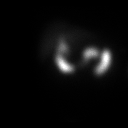
[frame 26/60]
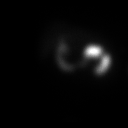
[frame 36/60]
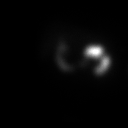
[frame 46/60]
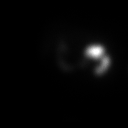
[frame 56/60]
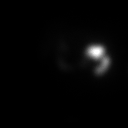

[he hepatobiliary · 3.43mm/px · 6 of 60 frames shown (2 of 2)]
[frame 6/60]
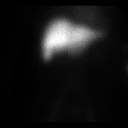
[frame 16/60]
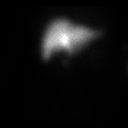
[frame 26/60]
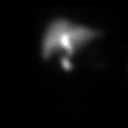
[frame 36/60]
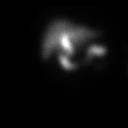
[frame 46/60]
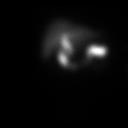
[frame 56/60]
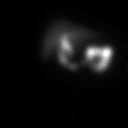

[12 of 12 positions shown; findings below may reference images not displayed]

FINDINGS: Normal hepatic excretion.

Visualization of small bowel within 15 minutes.

No evidence of bile leak.
IMPRESSION: No evidence of bile leak.

## 2017-04-28 ENCOUNTER — Ambulatory Visit (HOSPITAL_BASED_OUTPATIENT_CLINIC_OR_DEPARTMENT_OTHER): Payer: 59

## 2017-04-28 ENCOUNTER — Other Ambulatory Visit (HOSPITAL_BASED_OUTPATIENT_CLINIC_OR_DEPARTMENT_OTHER): Payer: 59

## 2017-04-28 ENCOUNTER — Ambulatory Visit (HOSPITAL_BASED_OUTPATIENT_CLINIC_OR_DEPARTMENT_OTHER): Payer: 59 | Admitting: Hematology & Oncology

## 2017-04-28 VITALS — BP 144/78 | HR 65 | Temp 98.0°F | Resp 16 | Wt 132.8 lb

## 2017-04-28 DIAGNOSIS — D631 Anemia in chronic kidney disease: Secondary | ICD-10-CM

## 2017-04-28 DIAGNOSIS — R5383 Other fatigue: Secondary | ICD-10-CM | POA: Diagnosis not present

## 2017-04-28 DIAGNOSIS — N189 Chronic kidney disease, unspecified: Secondary | ICD-10-CM

## 2017-04-28 DIAGNOSIS — D560 Alpha thalassemia: Secondary | ICD-10-CM | POA: Diagnosis not present

## 2017-04-28 DIAGNOSIS — D563 Thalassemia minor: Secondary | ICD-10-CM

## 2017-04-28 LAB — CBC WITH DIFFERENTIAL (CANCER CENTER ONLY)
BASO#: 0 10*3/uL (ref 0.0–0.2)
BASO%: 0.4 % (ref 0.0–2.0)
EOS%: 3 % (ref 0.0–7.0)
Eosinophils Absolute: 0.2 10*3/uL (ref 0.0–0.5)
HCT: 31.5 % — ABNORMAL LOW (ref 34.8–46.6)
HGB: 10 g/dL — ABNORMAL LOW (ref 11.6–15.9)
LYMPH#: 2.6 10*3/uL (ref 0.9–3.3)
LYMPH%: 49.5 % — ABNORMAL HIGH (ref 14.0–48.0)
MCH: 24 pg — ABNORMAL LOW (ref 26.0–34.0)
MCHC: 31.7 g/dL — AB (ref 32.0–36.0)
MCV: 76 fL — ABNORMAL LOW (ref 81–101)
MONO#: 0.3 10*3/uL (ref 0.1–0.9)
MONO%: 6.2 % (ref 0.0–13.0)
NEUT#: 2.2 10*3/uL (ref 1.5–6.5)
NEUT%: 40.9 % (ref 39.6–80.0)
PLATELETS: 215 10*3/uL (ref 145–400)
RBC: 4.16 10*6/uL (ref 3.70–5.32)
RDW: 14.9 % (ref 11.1–15.7)
WBC: 5.3 10*3/uL (ref 3.9–10.0)

## 2017-04-28 MED ORDER — DARBEPOETIN ALFA 300 MCG/0.6ML IJ SOSY
PREFILLED_SYRINGE | INTRAMUSCULAR | Status: AC
  Filled 2017-04-28: qty 0.6

## 2017-04-28 MED ORDER — DARBEPOETIN ALFA 300 MCG/0.6ML IJ SOSY
300.0000 ug | PREFILLED_SYRINGE | Freq: Once | INTRAMUSCULAR | Status: AC
  Administered 2017-04-28: 300 ug via SUBCUTANEOUS

## 2017-04-28 NOTE — Patient Instructions (Signed)

## 2017-04-28 NOTE — Progress Notes (Signed)
Hematology and Oncology Follow Up Visit  [name] [id number] February 03, 1956 61 y.o. 04/28/2017   Principle Diagnosis:  Erythropoietin deficiency anemia Alpha-Thalassemia   Current Therapy:   Aranesp 300 mcg SQ for Hgb < 11 Folic acid 1 mg PO daily     Interim History:  [name] is here today  for a follow-up. She is doing okay. She has had no specific complaints.  She is a very nice Mother's Day weekend. She went out to brunch.  She's had no problems with bleeding or bruising. She's had no issues with fatigue or weakness. There's been no cough. She's had no leg swelling. She's had no rashes.  She has had no issues with infections or influenza.  We last saw her back in November. At that point, she did not get a booster shot. I think her blood pressure was up a little too much.  Her appetite is doing okay. She's had no nausea or vomiting.   There has been no change in bowel or bladder habits. She said that she does not feel like she can empty her bladder. She'll speak to her family doctor about this.  Overall, her performance status is ECOG 1.  Medications:  Allergies as of 04/28/2017      Reactions   Isovue [iopamidol] Hives   Pt broke out in several facial hives, one on her back.  She will need full premeds in the future.  J Bohm No allergy demonstrated to  Orally ingested Iodinated agent. (Gastrographin CT Scan 05-12-16) Pt broke out in several facial hives, one on her back.  She will need full premeds in the future.  J Bohm   Iodine-131 Rash      Medication List       Accurate as of 04/28/17 12:23 PM. Always use your most recent med list.          amLODipine 10 MG tablet Commonly known as:  NORVASC Take 1 tablet (10 mg total) by mouth daily.   atorvastatin 40 MG tablet Commonly known as:  LIPITOR Take 1 tablet (40 mg total) by mouth daily.   folic acid 1 MG tablet Commonly known as:  FOLVITE Take 2 tablets (2 mg total) by mouth daily.   Magnesium 100 MG  Caps Take 1 tablet by mouth daily after breakfast.       Allergies:  Allergies  Allergen Reactions  . Isovue [Iopamidol] Hives    Pt broke out in several facial hives, one on her back.  She will need full premeds in the future.  J Bohm No allergy demonstrated to  Orally ingested Iodinated agent. (Gastrographin CT Scan 05-12-16) Pt broke out in several facial hives, one on her back.  She will need full premeds in the future.  J Bohm  . Iodine-131 Rash    Past Medical History, Surgical history, Social history, and Family History were reviewed and updated.  Review of Systems: All other 10 point review of systems is negative.   Physical Exam:  weight is 132 lb 12.8 oz (60.2 kg). Her oral temperature is 98 F (36.7 C). Her blood pressure is 144/78 (abnormal) and her pulse is 65. Her respiration is 16 and oxygen saturation is 100%.   Wt Readings from Last 3 Encounters:  04/28/17 132 lb 12.8 oz (60.2 kg)  03/03/17 132 lb (59.9 kg)  01/13/17 130 lb (59 kg)    Ocular: Sclerae unicteric, pupils equal, round and reactive to light Ear-nose-throat: Oropharynx clear, dentition fair Lymphatic: No cervical  supraclavicular or axillary adenopathy Lungs no rales or rhonchi, good excursion bilaterally Heart regular rate and rhythm, no murmur appreciated Abd soft, nontender, positive bowel sounds, no liver or spleen tip palpated on exam, no fluid wave MSK no focal spinal tenderness, no joint edema Neuro: non-focal, well-oriented, appropriate affect Breasts: Deferred  Lab Results  Component Value Date   WBC 5.3 04/28/2017   HGB 10.0 (L) 04/28/2017   HCT 31.5 (L) 04/28/2017   MCV 76 (L) 04/28/2017   PLT 215 04/28/2017   Lab Results  Component Value Date   FERRITIN 89 01/06/2017   IRON 78 01/06/2017   TIBC 226 (L) 01/06/2017   UIBC 148 01/06/2017   IRONPCTSAT 34 01/06/2017   Lab Results  Component Value Date   RETICCTPCT <0.4 (L) 05/20/2016   RBC 4.16 04/28/2017   No results  found for: KPAFRELGTCHN, LAMBDASER, KAPLAMBRATIO No results found for: IGGSERUM, IGA, IGMSERUM No results found for: Ronnald Ramp, A1GS, A2GS, Tillman Sers, SPEI   Chemistry      Component Value Date/Time   NA 140 10/28/2016 1127   K 3.3 (L) 10/28/2016 1127   CL 104 06/10/2016 1509   CO2 26 10/28/2016 1127   BUN 12.4 10/28/2016 1127   CREATININE 1.0 10/28/2016 1127      Component Value Date/Time   CALCIUM 9.6 10/28/2016 1127   ALKPHOS 88 10/28/2016 1127   AST 32 10/28/2016 1127   ALT 9 10/28/2016 1127   BILITOT 0.55 10/28/2016 1127     Impression and Plan: [name] is a very pleasant 61 yo African American female with history of erythropoietin deficiency anemia and alpha thalassemia.   We will go ahead and give her a dose of Aranesp today. Her hemoglobin is dropping. I do not want to see drop any further. She really is not that symptomatic right now and I would prefer if she did not have any symptoms.   Volanda Napoleon, MD 5/14/201812:23 PM

## 2017-04-29 LAB — FERRITIN: Ferritin: 93 ng/ml (ref 9–269)

## 2017-04-29 LAB — IRON AND TIBC
%SAT: 31 % (ref 21–57)
IRON: 66 ug/dL (ref 41–142)
TIBC: 211 ug/dL — ABNORMAL LOW (ref 236–444)
UIBC: 145 ug/dL (ref 120–384)

## 2017-04-29 LAB — RETICULOCYTES: RETICULOCYTE COUNT: 0.9 % (ref 0.6–2.6)

## 2017-04-30 ENCOUNTER — Telehealth: Payer: Self-pay | Admitting: *Deleted

## 2017-04-30 NOTE — Telephone Encounter (Addendum)
Message left  ----- Message from Volanda Napoleon, MD sent at 04/29/2017 10:21 AM EDT ----- Call - iron level is good!! pete

## 2017-06-30 ENCOUNTER — Ambulatory Visit: Payer: 59 | Admitting: Family

## 2017-06-30 ENCOUNTER — Other Ambulatory Visit: Payer: 59

## 2017-06-30 ENCOUNTER — Ambulatory Visit: Payer: 59

## 2017-10-21 ENCOUNTER — Ambulatory Visit: Payer: 59 | Admitting: Family

## 2017-10-21 ENCOUNTER — Ambulatory Visit: Payer: 59

## 2017-10-21 ENCOUNTER — Other Ambulatory Visit: Payer: 59

## 2017-10-24 ENCOUNTER — Ambulatory Visit: Payer: 59

## 2017-10-24 ENCOUNTER — Other Ambulatory Visit: Payer: 59

## 2017-10-24 ENCOUNTER — Ambulatory Visit: Payer: 59 | Admitting: Hematology & Oncology

## 2017-11-05 ENCOUNTER — Ambulatory Visit: Payer: 59 | Admitting: Hematology & Oncology

## 2017-11-05 ENCOUNTER — Ambulatory Visit: Payer: 59

## 2017-11-05 ENCOUNTER — Other Ambulatory Visit: Payer: 59

## 2017-11-28 ENCOUNTER — Ambulatory Visit: Payer: 59

## 2017-11-28 ENCOUNTER — Ambulatory Visit (HOSPITAL_BASED_OUTPATIENT_CLINIC_OR_DEPARTMENT_OTHER): Payer: 59 | Admitting: Hematology & Oncology

## 2017-11-28 ENCOUNTER — Other Ambulatory Visit: Payer: Self-pay

## 2017-11-28 ENCOUNTER — Encounter: Payer: Self-pay | Admitting: Hematology & Oncology

## 2017-11-28 ENCOUNTER — Other Ambulatory Visit (HOSPITAL_BASED_OUTPATIENT_CLINIC_OR_DEPARTMENT_OTHER): Payer: 59

## 2017-11-28 ENCOUNTER — Encounter (INDEPENDENT_AMBULATORY_CARE_PROVIDER_SITE_OTHER): Payer: Self-pay

## 2017-11-28 VITALS — BP 156/90 | HR 72 | Temp 97.9°F | Resp 18 | Wt 133.1 lb

## 2017-11-28 DIAGNOSIS — N189 Chronic kidney disease, unspecified: Secondary | ICD-10-CM

## 2017-11-28 DIAGNOSIS — D563 Thalassemia minor: Secondary | ICD-10-CM

## 2017-11-28 DIAGNOSIS — D560 Alpha thalassemia: Secondary | ICD-10-CM | POA: Diagnosis not present

## 2017-11-28 DIAGNOSIS — R110 Nausea: Secondary | ICD-10-CM

## 2017-11-28 DIAGNOSIS — D631 Anemia in chronic kidney disease: Secondary | ICD-10-CM

## 2017-11-28 LAB — CBC WITH DIFFERENTIAL (CANCER CENTER ONLY)
BASO#: 0 10*3/uL (ref 0.0–0.2)
BASO%: 0.4 % (ref 0.0–2.0)
EOS%: 5.1 % (ref 0.0–7.0)
Eosinophils Absolute: 0.3 10*3/uL (ref 0.0–0.5)
HEMATOCRIT: 32.5 % — AB (ref 34.8–46.6)
HGB: 10.1 g/dL — ABNORMAL LOW (ref 11.6–15.9)
LYMPH#: 2.9 10*3/uL (ref 0.9–3.3)
LYMPH%: 50.7 % — AB (ref 14.0–48.0)
MCH: 23.6 pg — ABNORMAL LOW (ref 26.0–34.0)
MCHC: 31.1 g/dL — AB (ref 32.0–36.0)
MCV: 76 fL — ABNORMAL LOW (ref 81–101)
MONO#: 0.3 10*3/uL (ref 0.1–0.9)
MONO%: 4.8 % (ref 0.0–13.0)
NEUT#: 2.2 10*3/uL (ref 1.5–6.5)
NEUT%: 39 % — AB (ref 39.6–80.0)
Platelets: 205 10*3/uL (ref 145–400)
RBC: 4.28 10*6/uL (ref 3.70–5.32)
RDW: 13.9 % (ref 11.1–15.7)
WBC: 5.7 10*3/uL (ref 3.9–10.0)

## 2017-11-28 LAB — CMP (CANCER CENTER ONLY)
ALT(SGPT): 19 U/L (ref 10–47)
AST: 30 U/L (ref 11–38)
Albumin: 3.5 g/dL (ref 3.3–5.5)
Alkaline Phosphatase: 83 U/L (ref 26–84)
BILIRUBIN TOTAL: 0.6 mg/dL (ref 0.20–1.60)
BUN, Bld: 19 mg/dL (ref 7–22)
CALCIUM: 9.5 mg/dL (ref 8.0–10.3)
CO2: 31 meq/L (ref 18–33)
Chloride: 99 mEq/L (ref 98–108)
Creat: 1.1 mg/dl (ref 0.6–1.2)
GLUCOSE: 92 mg/dL (ref 73–118)
Potassium: 3.9 mEq/L (ref 3.3–4.7)
Sodium: 139 mEq/L (ref 128–145)
Total Protein: 7.5 g/dL (ref 6.4–8.1)

## 2017-11-28 NOTE — Progress Notes (Signed)
Hematology and Oncology Follow Up Visit  [name] [id number] May 03, 1956 61 y.o. 11/28/2017   Principle Diagnosis:  Erythropoietin deficiency anemia Alpha-Thalassemia   Current Therapy:   Aranesp 300 mcg SQ for Hgb < 11 Folic acid 1 mg PO daily     Interim History:  [name] is here today  for a follow-up.  Her main complaint has been this nausea.  She has had this for a couple months.  It is not associated with any emesis.  She has had no diarrhea.  She may have a little bit of constipation.  She did have her gallbladder taken out back in June 2017.  The pathology report does show some chronic cholecystitis.  She has had no fever.  She has had no dysuria.  There is no hematuria.  She has been seen by gastroenterology.  In the past, she has been seen by Dr. Carol Ada.  He has done a workup on her which was not conclusive.  Otherwise, she seems to be doing okay.  She last got Aranesp I think back in May.  Her iron studies back in May showed a ferritin of 93 with an iron saturation of 31%.  She has had no cough.  She has had no shortness of breath.  There has been no bleeding.  She had a decent Thanksgiving.  She will be with family for Christmas.  Medications:  Allergies as of 11/28/2017      Reactions   Isovue [iopamidol] Hives   Pt broke out in several facial hives, one on her back.  She will need full premeds in the future.  J Bohm No allergy demonstrated to  Orally ingested Iodinated agent. (Gastrographin CT Scan 05-12-16) Pt broke out in several facial hives, one on her back.  She will need full premeds in the future.  J Bohm   Iodine-131 Rash      Medication List        Accurate as of 11/28/17  3:38 PM. Always use your most recent med list.          amLODipine 10 MG tablet Commonly known as:  NORVASC Take 1 tablet (10 mg total) by mouth daily.   atorvastatin 40 MG tablet Commonly known as:  LIPITOR Take 1 tablet (40 mg total) by mouth daily.     folic acid 1 MG tablet Commonly known as:  FOLVITE Take 2 tablets (2 mg total) by mouth daily.   Magnesium 100 MG Caps Take 1 tablet by mouth daily after breakfast.       Allergies:  Allergies  Allergen Reactions  . Isovue [Iopamidol] Hives    Pt broke out in several facial hives, one on her back.  She will need full premeds in the future.  J Bohm No allergy demonstrated to  Orally ingested Iodinated agent. (Gastrographin CT Scan 05-12-16) Pt broke out in several facial hives, one on her back.  She will need full premeds in the future.  J Bohm  . Iodine-131 Rash    Past Medical History, Surgical history, Social history, and Family History were reviewed and updated.  Review of Systems: As stated in the interim history  Physical Exam:  weight is 133 lb 1.9 oz (60.4 kg). Her oral temperature is 97.9 F (36.6 C). Her blood pressure is 156/90 (abnormal) and her pulse is 72. Her respiration is 18 and oxygen saturation is 98%.   Wt Readings from Last 3 Encounters:  11/28/17 133 lb 1.9 oz (60.4 kg)  04/28/17 132 lb 12.8 oz (60.2 kg)  03/03/17 132 lb (59.9 kg)    Well-developed and well-nourished African-American female.  Head and neck exam shows no ocular or oral lesions.  She has no palpable cervical or supra clavicular lymph nodes.  There is no scleral icterus.  Lungs are clear bilaterally.  Cardiac exam regular rate and rhythm with no murmurs, rubs or bruits.  Abdomen is soft.  Bowel sounds are decreased.  There is no guarding or rebound tenderness.  She has no fluid wave.  There is no palpable liver or spleen tip.  Back exam shows no tenderness over the spine, ribs or hips.  Extremities shows no clubbing, cyanosis or edema.  Neurological exam shows no focal neurological deficits.  Skin exam shows no rashes, ecchymoses or petechia.  Lab Results  Component Value Date   WBC 5.7 11/28/2017   HGB 10.1 (L) 11/28/2017   HCT 32.5 (L) 11/28/2017   MCV 76 (L) 11/28/2017   PLT 205  11/28/2017   Lab Results  Component Value Date   FERRITIN 93 04/28/2017   IRON 66 04/28/2017   TIBC 211 (L) 04/28/2017   UIBC 145 04/28/2017   IRONPCTSAT 31 04/28/2017   Lab Results  Component Value Date   RETICCTPCT <0.4 (L) 05/20/2016   RBC 4.28 11/28/2017   No results found for: KPAFRELGTCHN, LAMBDASER, KAPLAMBRATIO No results found for: IGGSERUM, IGA, IGMSERUM No results found for: Odetta Pink, SPEI   Chemistry      Component Value Date/Time   NA 139 11/28/2017 1450   NA 140 10/28/2016 1127   K 3.9 11/28/2017 1450   K 3.3 (L) 10/28/2016 1127   CL 99 11/28/2017 1450   CO2 31 11/28/2017 1450   CO2 26 10/28/2016 1127   BUN 19 11/28/2017 1450   BUN 12.4 10/28/2016 1127   CREATININE 1.1 11/28/2017 1450   CREATININE 1.0 10/28/2016 1127      Component Value Date/Time   CALCIUM 9.5 11/28/2017 1450   CALCIUM 9.6 10/28/2016 1127   ALKPHOS 83 11/28/2017 1450   ALKPHOS 88 10/28/2016 1127   AST 30 11/28/2017 1450   AST 32 10/28/2016 1127   ALT 19 11/28/2017 1450   ALT 9 10/28/2016 1127   BILITOT 0.60 11/28/2017 1450   BILITOT 0.55 10/28/2016 1127     Impression and Plan: [name] is a very pleasant 61 yo African American female with history of erythropoietin deficiency anemia and alpha thalassemia.   I am not sure exactly what is going on with this nausea.  I offered to do a CT scan or an upper GI with small bowel follow-through.  She wants to hold off on these.  I also offered to call in a antiemetic for her.  I thought this might help.  Again, she wants to hold off on this.  I will see about having her gastroenterologist see her.  Hopefully this will make a difference.  I would like to see her back in 3 months.  I will not give her any Aranesp today.  She is not symptomatic with her anemia.  I spent about 35 minutes with her today.  I was try to do my best to sort out this abdominal issue.  Marland Kitchen Volanda Napoleon, MD 12/14/20183:38 PM

## 2017-11-29 LAB — RETICULOCYTES: Reticulocyte Count: 0.9 % (ref 0.6–2.6)

## 2017-12-01 LAB — FERRITIN: FERRITIN: 110 ng/mL (ref 9–269)

## 2017-12-01 LAB — IRON AND TIBC
%SAT: 19 % — ABNORMAL LOW (ref 21–57)
IRON: 42 ug/dL (ref 41–142)
TIBC: 223 ug/dL — AB (ref 236–444)
UIBC: 180 ug/dL (ref 120–384)

## 2017-12-02 ENCOUNTER — Telehealth: Payer: Self-pay | Admitting: *Deleted

## 2017-12-02 NOTE — Telephone Encounter (Addendum)
Spoke to son. He will have patient call to schedule an iron infusion.   ----- Message from Volanda Napoleon, MD sent at 12/01/2017  5:54 PM EST ----- Call - iron is low!!  Please st up for feraheme next week!!  Laurey Arrow

## 2017-12-12 ENCOUNTER — Other Ambulatory Visit: Payer: Self-pay | Admitting: Family

## 2017-12-12 ENCOUNTER — Ambulatory Visit (HOSPITAL_BASED_OUTPATIENT_CLINIC_OR_DEPARTMENT_OTHER): Payer: 59

## 2017-12-12 ENCOUNTER — Other Ambulatory Visit: Payer: Self-pay

## 2017-12-12 VITALS — BP 186/98 | HR 60 | Temp 97.8°F | Resp 18

## 2017-12-12 DIAGNOSIS — N189 Chronic kidney disease, unspecified: Secondary | ICD-10-CM

## 2017-12-12 DIAGNOSIS — D631 Anemia in chronic kidney disease: Secondary | ICD-10-CM | POA: Diagnosis not present

## 2017-12-12 MED ORDER — SODIUM CHLORIDE 0.9 % IV SOLN
Freq: Once | INTRAVENOUS | Status: AC
  Administered 2017-12-12: 15:00:00 via INTRAVENOUS

## 2017-12-12 MED ORDER — SODIUM CHLORIDE 0.9 % IV SOLN
510.0000 mg | Freq: Once | INTRAVENOUS | Status: AC
  Administered 2017-12-12: 510 mg via INTRAVENOUS
  Filled 2017-12-12: qty 17

## 2017-12-12 NOTE — Progress Notes (Signed)
Patient states she has not taken her blood pressure medication today. Patent denies dizziness, headache, or visual disturbances. Patient states she will take her blood pressure medication when she gets home.

## 2017-12-12 NOTE — Patient Instructions (Signed)

## 2018-01-20 ENCOUNTER — Ambulatory Visit: Payer: 59 | Admitting: Physician Assistant

## 2018-01-27 ENCOUNTER — Ambulatory Visit: Payer: 59 | Admitting: Physician Assistant

## 2018-01-27 DIAGNOSIS — Z0189 Encounter for other specified special examinations: Secondary | ICD-10-CM

## 2018-02-17 ENCOUNTER — Ambulatory Visit (INDEPENDENT_AMBULATORY_CARE_PROVIDER_SITE_OTHER): Payer: 59 | Admitting: Sports Medicine

## 2018-02-17 ENCOUNTER — Encounter: Payer: Self-pay | Admitting: Sports Medicine

## 2018-02-17 DIAGNOSIS — E785 Hyperlipidemia, unspecified: Secondary | ICD-10-CM

## 2018-02-17 DIAGNOSIS — Z0000 Encounter for general adult medical examination without abnormal findings: Secondary | ICD-10-CM | POA: Diagnosis not present

## 2018-02-17 DIAGNOSIS — I10 Essential (primary) hypertension: Secondary | ICD-10-CM | POA: Diagnosis not present

## 2018-02-17 DIAGNOSIS — H6122 Impacted cerumen, left ear: Secondary | ICD-10-CM | POA: Diagnosis not present

## 2018-02-17 MED ORDER — LISINOPRIL 20 MG PO TABS: 20.0000 mg | ORAL_TABLET | Freq: Every day | ORAL | 3 refills | Status: DC

## 2018-02-17 NOTE — Progress Notes (Signed)
Subjective:    CC: Annual physical exam  HPI:  Hypertension: Blood pressure is elevated today, has been elevated on multiple previous occasions, no headaches, visual changes, chest pain.  Only taking amlodipine low-dose.  Preventive measures: Due for blood work, cervical cancer screening, breast cancer screening, she does have a breast mass that is being monitored at the breast center in [place].  Hyperlipidemia: Due for recheck.  I reviewed the past medical history, family history, social history, surgical history, and allergies today and no changes were needed.  Please see the problem list section below in epic for further details.  Past Medical History: Past Medical History:  Diagnosis Date  . Abdominal pain    persistent  . Alpha thalassemia minor trait 06/26/2016  . 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 Hyperlipidemia   . Hypertension    Past Surgical History: Past Surgical History:  Procedure Laterality Date  . CHOLECYSTECTOMY N/A 05/21/2016   Procedure: LAPAROSCOPIC CHOLECYSTECTOMY;  Surgeon: Erroll Luna, MD;  Location: [facility];  Service: General;  Laterality: N/A;  . COLONOSCOPY    . GIVENS CAPSULE STUDY N/A 07/22/2016   Procedure: GIVENS CAPSULE STUDY;  Surgeon: Carol Ada, MD;  Location: [facility];  Service: Endoscopy;  Laterality: N/A;   Social History: Social History   Socioeconomic History  . Marital status: Divorced    Spouse name: None  . Number of children: None  . Years of education: None  . Highest education level: None  Social Needs  . Financial resource strain: None  . Food insecurity - worry: None  . Food insecurity - inability: None  . Transportation needs - medical: None  . Transportation needs - non-medical: None  Occupational History  . None  Tobacco Use  . Smoking status: Never Smoker  . Smokeless tobacco: Never Used  Substance and Sexual Activity  . Alcohol use: No    Alcohol/week: 0.0 oz  . Drug use: No  . Sexual  activity: No  Other Topics Concern  . None  Social History Narrative  . None   Family History: Family History  Problem Relation Age of Onset  . Stroke Mother   . Hyperlipidemia Mother   . Diabetes Mother    Allergies: Allergies  Allergen Reactions  . Isovue [Iopamidol] Hives    Pt broke out in several facial hives, one on her back.  She will need full premeds in the future.  J Bohm No allergy demonstrated to  Orally ingested Iodinated agent. (Gastrographin CT Scan 05-12-16) Pt broke out in several facial hives, one on her back.  She will need full premeds in the future.  J Bohm  . Iodine-131 Rash   Medications: See med rec.  Review of Systems: No headache, visual changes, nausea, vomiting, diarrhea, constipation, dizziness, abdominal pain, skin rash, fevers, chills, night sweats, swollen lymph nodes, weight loss, chest pain, body aches, joint swelling, muscle aches, shortness of breath, mood changes, visual or auditory hallucinations.  Objective:    General: Well Developed, well nourished, and in no acute distress.  Neuro: Alert and oriented x3, extra-ocular muscles intact, sensation grossly intact. Cranial nerves II through XII are intact, motor, sensory, and coordinative functions are all intact. HEENT: Normocephalic, atraumatic, pupils equal round reactive to light, neck supple, no masses, no lymphadenopathy, thyroid nonpalpable. Oropharynx, nasopharynx, external ear canals are unremarkable.  Left-sided cerumen impaction Skin: Warm and dry, no rashes noted.  Cardiac: Regular rate and rhythm, no murmurs rubs or gallops.  Respiratory: Clear to auscultation bilaterally. Not using  accessory muscles, speaking in full sentences.  Abdominal: Soft, nontender, nondistended, positive bowel sounds, no masses, no organomegaly.  Musculoskeletal: Shoulder, elbow, wrist, hip, knee, ankle stable, and with full range of motion.  Indication: Cerumen impaction of the left ear(s) Medical  necessity statement: On physical examination, cerumen impairs clinically significant portions of the external auditory canal, and tympanic membrane. Noted obstructive, copious cerumen that cannot be removed without magnification and instrumentations requiring physician skills Consent: Discussed benefits and risks of procedure and verbal consent obtained Procedure: Patient was prepped for the procedure. Utilized an otoscope to assess and take note of the ear canal, the tympanic membrane, and the presence, amount, and placement of the cerumen. Gentle water irrigation and soft plastic curette was utilized to remove cerumen.  Post procedure examination: shows cerumen was completely removed. Patient tolerated procedure well. The patient is made aware that they may experience temporary vertigo, temporary hearing loss, and temporary discomfort. If these symptom last for more than 24 hours to call the clinic or proceed to the ED.  Impression and Recommendations:    The patient was counselled, risk factors were discussed, anticipatory guidance given.  Annual physical exam Annual physical, routine labs. Mammogram, this will be done at the breast center due to a pre-existing mass that is being monitored. DEXA. Referral to Nelson Chimes PA-C for cervical cancer screening.  Hypertension Blood pressure has been elevated on several occasions now, continue amlodipine, adding lisinopril 20. Return in 2 weeks for a blood pressure check.  Hyperlipidemia with target LDL less than 100 Rechecking lipids.  ___________________________________________ Gwen Her. Dianah Field, M.D., ABFM., CAQSM. Primary Care and [place] In[employer] of [employer] [facility] [employer] [facility]

## 2018-02-17 NOTE — Assessment & Plan Note (Addendum)
Annual physical, routine labs. Mammogram, this will be done at the breast center due to a pre-existing mass that is being monitored. DEXA. Referral to Nelson Chimes PA-C for cervical cancer screening.

## 2018-02-17 NOTE — Assessment & Plan Note (Signed)
Rechecking lipids. 

## 2018-02-17 NOTE — Assessment & Plan Note (Signed)
Blood pressure has been elevated on several occasions now, continue amlodipine, adding lisinopril 20. Return in 2 weeks for a blood pressure check.

## 2018-02-18 ENCOUNTER — Other Ambulatory Visit: Payer: Self-pay | Admitting: Sports Medicine

## 2018-02-18 ENCOUNTER — Other Ambulatory Visit: Payer: Self-pay | Admitting: *Deleted

## 2018-02-18 DIAGNOSIS — E785 Hyperlipidemia, unspecified: Secondary | ICD-10-CM

## 2018-02-18 LAB — LIPID PANEL W/REFLEX DIRECT LDL
Cholesterol: 256 mg/dL — ABNORMAL HIGH (ref <200)
HDL: 61 mg/dL (ref >50)
LDL Cholesterol (Calc): 173 mg/dL (calc) — ABNORMAL HIGH
Non-HDL Cholesterol (Calc): 195 mg/dL (calc) — ABNORMAL HIGH (ref <130)
Total CHOL/HDL Ratio: 4.2 (calc) (ref <5.0)
Triglycerides: 99 mg/dL (ref <150)

## 2018-02-18 LAB — COMPREHENSIVE METABOLIC PANEL
AG Ratio: 1.5 (calc) (ref 1.0–2.5)
ALT: 7 U/L (ref 6–29)
AST: 29 U/L (ref 10–35)
Alkaline phosphatase (APISO): 78 U/L (ref 33–130)
BUN/Creatinine Ratio: 14 (calc) (ref 6–22)
CO2: 28 mmol/L (ref 20–32)
Creat: 1.13 mg/dL — ABNORMAL HIGH (ref 0.50–0.99)
Glucose, Bld: 52 mg/dL — ABNORMAL LOW (ref 65–99)
Sodium: 140 mmol/L (ref 135–146)

## 2018-02-18 LAB — COMPREHENSIVE METABOLIC PANEL WITH GFR
Albumin: 4.1 g/dL (ref 3.6–5.1)
BUN: 16 mg/dL (ref 7–25)
Calcium: 9.9 mg/dL (ref 8.6–10.4)
Chloride: 105 mmol/L (ref 98–110)
Globulin: 2.8 g/dL (ref 1.9–3.7)
Potassium: 4 mmol/L (ref 3.5–5.3)
Total Bilirubin: 0.4 mg/dL (ref 0.2–1.2)
Total Protein: 6.9 g/dL (ref 6.1–8.1)

## 2018-02-18 LAB — CBC
HCT: 32.6 % — ABNORMAL LOW (ref 35.0–45.0)
Hemoglobin: 9.9 g/dL — ABNORMAL LOW (ref 11.7–15.5)
MCH: 23.2 pg — ABNORMAL LOW (ref 27.0–33.0)
MCHC: 30.4 g/dL — ABNORMAL LOW (ref 32.0–36.0)
MCV: 76.3 fL — ABNORMAL LOW (ref 80.0–100.0)
MPV: 10 fL (ref 7.5–12.5)
Platelets: 218 10*3/uL (ref 140–400)
RBC: 4.27 Million/uL (ref 3.80–5.10)
RDW: 13.8 % (ref 11.0–15.0)
WBC: 4.8 10*3/uL (ref 3.8–10.8)

## 2018-02-18 LAB — HIV ANTIBODY (ROUTINE TESTING W REFLEX): HIV 1&2 Ab, 4th Generation: NONREACTIVE

## 2018-02-18 LAB — HEPATITIS C ANTIBODY
Hepatitis C Ab: NONREACTIVE
SIGNAL TO CUT-OFF: 0.03 (ref <1.00)

## 2018-02-18 LAB — TSH: TSH: 2.82 m[IU]/L (ref 0.40–4.50)

## 2018-02-18 MED ORDER — ATORVASTATIN CALCIUM 40 MG PO TABS: 40.0000 mg | ORAL_TABLET | Freq: Every day | ORAL | 3 refills | Status: DC

## 2018-02-18 NOTE — Progress Notes (Signed)
Cholesterol refill sent to pharmacy per pt request. KG LPN

## 2018-02-26 ENCOUNTER — Ambulatory Visit: Payer: 59 | Admitting: Sports Medicine

## 2018-03-03 ENCOUNTER — Ambulatory Visit: Payer: 59

## 2018-03-04 ENCOUNTER — Ambulatory Visit: Payer: 59

## 2018-03-04 ENCOUNTER — Other Ambulatory Visit: Payer: 59

## 2018-03-04 ENCOUNTER — Ambulatory Visit: Payer: 59 | Admitting: Physician Assistant

## 2018-03-12 ENCOUNTER — Ambulatory Visit (INDEPENDENT_AMBULATORY_CARE_PROVIDER_SITE_OTHER): Payer: 59 | Admitting: Physician Assistant

## 2018-03-12 ENCOUNTER — Other Ambulatory Visit (HOSPITAL_COMMUNITY)
Admission: RE | Admit: 2018-03-12 | Discharge: 2018-03-12 | Disposition: A | Discharge status: Home / Self Care | Payer: 59 | Source: Ambulatory Visit | Attending: Physician Assistant | Admitting: Physician Assistant

## 2018-03-12 ENCOUNTER — Encounter: Payer: Self-pay | Admitting: Physician Assistant

## 2018-03-12 VITALS — BP 133/82 | HR 66 | Wt 125.0 lb

## 2018-03-12 DIAGNOSIS — Z124 Encounter for screening for malignant neoplasm of cervix: Secondary | ICD-10-CM | POA: Insufficient documentation

## 2018-03-12 DIAGNOSIS — Z780 Asymptomatic menopausal state: Secondary | ICD-10-CM

## 2018-03-12 MED ORDER — CALCIUM CARBONATE-VITAMIN D 600-400 MG-UNIT PO TABS: 1.0000 | ORAL_TABLET | Freq: Two times a day (BID) | ORAL | 11 refills | Status: DC

## 2018-03-12 NOTE — Patient Instructions (Addendum)
Preventing Osteoporosis, Adult Osteoporosis is a condition that causes the bones to get weaker. With osteoporosis, the bones become thinner, and the normal spaces in bone tissue become larger. This can make the bones weak and cause them to break more easily. People who have osteoporosis are more likely to break their wrist, spine, or hip. Even a minor accident or injury can be enough to break weak bones. Osteoporosis can occur with aging. Your body constantly replaces old bone tissue with new tissue. As you get older, you may lose bone tissue more quickly, or it may be replaced more slowly. Osteoporosis is more likely to develop if you have poor nutrition or do not get enough calcium or vitamin D. Other lifestyle factors can also play a role. By making some diet and lifestyle changes, you can help to keep your bones healthy and help to prevent osteoporosis. What nutrition changes can be made? Nutrition plays an important role in maintaining healthy, strong bones.  Make sure you get enough calcium every day from food or from calcium supplements. ? If you are age 50 or younger, aim to get 1,000 mg of calcium every day. ? If you are older than age 50, aim to get 1,200 mg of calcium every day.  Try to get enough vitamin D every day. ? If you are age 70 or younger, aim to get 600 international units (IU) every day. ? If you are older than age 70, aim to get 800 international units every day.  Follow a healthy diet. Eat plenty of foods that contain calcium and vitamin D. ? Calcium is in milk, cheese, yogurt, and other dairy products. Some fish and vegetables are also good sources of calcium. Many foods such as cereals and breads have had calcium added to them (are fortified). Check nutrition labels to see how much calcium is in a food or drink. ? Foods that contain vitamin D include milk, cereals, salmon, and tuna. Your body also makes vitamin D when you are out in the sun. Bare skin exposure to the sun on  your face, arms, legs, or back for no more than 30 minutes a day, 2 times per week is more than enough. Beyond that, it is important to use sunblock to protect your skin from sunburn, which increases your risk for skin cancer.  What lifestyle changes can be made? Making changes in your everyday life can also play an important role in preventing osteoporosis.  Stay active and get exercise every day. Ask your health care provider what types of exercise are best for you.  Do not use any products that contain nicotine or tobacco, such as cigarettes and e-cigarettes. If you need help quitting, ask your health care provider.  Limit alcohol intake to no more than 1 drink a day for nonpregnant women and 2 drinks a day for men. One drink equals 12 oz of beer, 5 oz of wine, or 1 oz of hard liquor.  Why are these changes important? Making these nutrition and lifestyle changes can:  Help you develop and maintain healthy, strong bones.  Prevent loss of bone mass and the problems that are caused by that loss, such as broken bones and delayed healing.  Make you feel better mentally and physically.  What can happen if changes are not made? Problems that can result from osteoporosis can be very serious. These may include:  A higher risk of broken bones that are painful and do not heal well.  Physical malformations, such as   a collapsed spine or a hunched back.  Problems with movement.  Where to find support: If you need help making changes to prevent osteoporosis, talk with your health care provider. You can ask for a referral to a diet and nutrition specialist (dietitian) and a physical therapist. Where to find more information: Learn more about osteoporosis from:  NIH Osteoporosis and Related Bone Diseases National Resource Center: www.niams.nih.gov/health_info/bone/osteoporosis/osteoporosis_ff.asp  U.S. Office on Women's Health:  www.womenshealth.gov/publications/our-publications/fact-sheet/osteoporosis.html  National Osteoporosis Foundation: www.nof.org/patients/what-is-osteoporosis/  Summary  Osteoporosis is a condition that causes weak bones that are more likely to break.  Eating a healthy diet and making sure you get enough calcium and vitamin D can help prevent osteoporosis.  Other ways to reduce your risk of osteoporosis include getting regular exercise and avoiding alcohol and products that contain nicotine or tobacco. This information is not intended to replace advice given to you by your health care provider. Make sure you discuss any questions you have with your health care provider. Document Released: 12/17/2015 Document Revised: 08/12/2016 Document Reviewed: 08/12/2016 Elsevier Interactive Patient Education  2018 [employer]  

## 2018-03-12 NOTE — Progress Notes (Signed)
HPI:                                                                [name] is a 62 y.o. female who presents to [facility] today for Pap smear only   GYN/Sexual Health  Obstetrics: G2P0010  Menstrual status: post-menopausal  LMP: n/a  Menses: n/a  Last pap smear: 10/2012, NILM  History of abnormal pap smears:  Sexually active: not currently  Current contraception: none needed  History of STI: no  Health Maintenance Health Maintenance  Topic Date Due  . MAMMOGRAM  08/21/2014  . PAP SMEAR  11/09/2017  . COLONOSCOPY  10/01/2021  . TETANUS/TDAP  10/08/2022  . Hepatitis C Screening  Completed  . HIV Screening  Completed    Past Medical History:  Diagnosis Date  . Abdominal pain    persistent  . Alpha thalassemia minor trait 06/26/2016  . 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 Hyperlipidemia   . Hypertension    Past Surgical History:  Procedure Laterality Date  . CHOLECYSTECTOMY N/A 05/21/2016   Procedure: LAPAROSCOPIC CHOLECYSTECTOMY;  Surgeon: Erroll Luna, MD;  Location: [place] [facility];  Service: General;  Laterality: N/A;  . COLONOSCOPY    . GIVENS CAPSULE STUDY N/A 07/22/2016   Procedure: GIVENS CAPSULE STUDY;  Surgeon: Carol Ada, MD;  Location: [facility];  Service: Endoscopy;  Laterality: N/A;   Social History   Tobacco Use  . Smoking status: Never Smoker  . Smokeless tobacco: Never Used  Substance Use Topics  . Alcohol use: No    Alcohol/week: 0.0 oz   family history includes Diabetes in her mother; Hyperlipidemia in her mother; Stroke in her mother.  ROS: negative except as noted in the HPI  Medications: Current Outpatient Medications  Medication Sig Dispense Refill  . amLODipine (NORVASC) 10 MG tablet Take 1 tablet (10 mg total) by mouth daily. 90 tablet 3  . atorvastatin (LIPITOR) 40 MG tablet Take 1 tablet (40 mg total) by mouth daily. 90 tablet 3  . folic acid (FOLVITE) 1 MG  tablet Take 2 tablets (2 mg total) by mouth daily. 60 tablet 4  . lisinopril (PRINIVIL,ZESTRIL) 20 MG tablet Take 1 tablet (20 mg total) by mouth daily. 90 tablet 3  . Magnesium 100 MG CAPS Take 1 tablet by mouth daily after breakfast.     No current facility-administered medications for this visit.    Allergies  Allergen Reactions  . Isovue [Iopamidol] Hives    Pt broke out in several facial hives, one on her back.  She will need full premeds in the future.  J Bohm No allergy demonstrated to  Orally ingested Iodinated agent. (Gastrographin CT Scan 05-12-16) Pt broke out in several facial hives, one on her back.  She will need full premeds in the future.  J Bohm  . Iodine-131 Rash       Objective:  BP 133/82   Pulse 66   Wt 125 lb (56.7 kg)   BMI 21.46 kg/m  GU: vulva without rashes or lesions, normal introitus and urethral meatus, vaginal mucosa without erythema, mild vaginal atrophic changes, no cervical discharge, cervix non-friable without lesions  A chaperone was used for the GU portion of the exam, Izell Maumelle, CMA.  No results found for this or any previous visit (from the past 72 hour(s)). No results found.    Assessment and Plan: 62 y.o. female with    Encounter for Papanicolaou smear of cervix - Plan: Cytology - PAP  Postmenopausal - Plan: Calcium Carbonate-Vitamin D 600-400 MG-UNIT tablet   Patient education and anticipatory guidance given Patient agrees with treatment plan Follow-up based on Pap results or sooner as needed  Darlyne Russian PA-C

## 2018-03-13 ENCOUNTER — Inpatient Hospital Stay: Payer: 59

## 2018-03-13 ENCOUNTER — Inpatient Hospital Stay: Payer: 59 | Admitting: Hematology & Oncology

## 2018-03-17 LAB — CYTOLOGY - PAP
DIAGNOSIS: NEGATIVE
HPV: NOT DETECTED

## 2018-03-17 NOTE — Progress Notes (Signed)
Normal pap smear No additional cervical cancer screening needed

## 2018-03-18 ENCOUNTER — Ambulatory Visit (INDEPENDENT_AMBULATORY_CARE_PROVIDER_SITE_OTHER): Payer: 59

## 2018-03-18 DIAGNOSIS — Z1231 Encounter for screening mammogram for malignant neoplasm of breast: Secondary | ICD-10-CM | POA: Diagnosis not present

## 2018-03-18 DIAGNOSIS — M8588 Other specified disorders of bone density and structure, other site: Secondary | ICD-10-CM

## 2018-09-16 ENCOUNTER — Inpatient Hospital Stay: Payer: 59

## 2018-09-16 ENCOUNTER — Encounter (INDEPENDENT_AMBULATORY_CARE_PROVIDER_SITE_OTHER): Payer: Self-pay

## 2018-09-16 ENCOUNTER — Other Ambulatory Visit: Payer: Self-pay

## 2018-09-16 ENCOUNTER — Inpatient Hospital Stay: Payer: 59 | Attending: Family | Admitting: Family

## 2018-09-16 ENCOUNTER — Encounter: Payer: Self-pay | Admitting: Family

## 2018-09-16 DIAGNOSIS — D560 Alpha thalassemia: Secondary | ICD-10-CM | POA: Insufficient documentation

## 2018-09-16 DIAGNOSIS — D563 Thalassemia minor: Secondary | ICD-10-CM

## 2018-09-16 DIAGNOSIS — D631 Anemia in chronic kidney disease: Secondary | ICD-10-CM

## 2018-09-16 DIAGNOSIS — Z79899 Other long term (current) drug therapy: Secondary | ICD-10-CM | POA: Diagnosis not present

## 2018-09-16 DIAGNOSIS — D509 Iron deficiency anemia, unspecified: Secondary | ICD-10-CM

## 2018-09-16 LAB — CBC WITH DIFFERENTIAL (CANCER CENTER ONLY)
Basophils Absolute: 0 10*3/uL (ref 0.0–0.1)
Basophils Relative: 1 %
Eosinophils Absolute: 0.2 10*3/uL (ref 0.0–0.5)
Eosinophils Relative: 4 %
HCT: 29.6 % — ABNORMAL LOW (ref 34.8–46.6)
Hemoglobin: 9.2 g/dL — ABNORMAL LOW (ref 11.6–15.9)
LYMPHS ABS: 2.3 10*3/uL (ref 0.9–3.3)
LYMPHS PCT: 47 %
MCH: 23.5 pg — ABNORMAL LOW (ref 26.0–34.0)
MCHC: 31.1 g/dL — ABNORMAL LOW (ref 32.0–36.0)
MCV: 75.7 fL — ABNORMAL LOW (ref 81.0–101.0)
MONO ABS: 0.3 10*3/uL (ref 0.1–0.9)
Monocytes Relative: 6 %
Neutro Abs: 2.1 10*3/uL (ref 1.5–6.5)
Neutrophils Relative %: 42 %
PLATELETS: 203 10*3/uL (ref 145–400)
RBC: 3.91 MIL/uL (ref 3.70–5.32)
RDW: 13.6 % (ref 11.1–15.7)
WBC: 4.9 10*3/uL (ref 3.9–10.0)

## 2018-09-16 LAB — CMP (CANCER CENTER ONLY)
ALK PHOS: 85 U/L — AB (ref 26–84)
ALT: 19 U/L (ref 10–47)
AST: 42 U/L — AB (ref 11–38)
Albumin: 3.4 g/dL — ABNORMAL LOW (ref 3.5–5.0)
Anion gap: 2 — ABNORMAL LOW (ref 5–15)
BUN: 23 mg/dL — AB (ref 7–22)
CALCIUM: 9.7 mg/dL (ref 8.0–10.3)
CO2: 33 mmol/L (ref 18–33)
CREATININE: 1.4 mg/dL — AB (ref 0.60–1.20)
Chloride: 109 mmol/L — ABNORMAL HIGH (ref 98–108)
GLUCOSE: 86 mg/dL (ref 73–118)
Potassium: 4.4 mmol/L (ref 3.3–4.7)
SODIUM: 144 mmol/L (ref 128–145)
Total Bilirubin: 0.5 mg/dL (ref 0.2–1.6)
Total Protein: 6.8 g/dL (ref 6.4–8.1)

## 2018-09-16 LAB — RETICULOCYTES
RBC.: 3.89 MIL/uL (ref 3.70–5.45)
RETIC CT PCT: 0.8 % (ref 0.7–2.1)
Retic Count, Absolute: 31.1 10*3/uL — ABNORMAL LOW (ref 33.7–90.7)

## 2018-09-16 MED ORDER — FOLIC ACID 1 MG PO TABS: 2.0000 mg | ORAL_TABLET | Freq: Every day | ORAL | 4 refills | Status: DC

## 2018-09-16 MED ORDER — DARBEPOETIN ALFA 300 MCG/0.6ML IJ SOSY
PREFILLED_SYRINGE | INTRAMUSCULAR | Status: AC
  Filled 2018-09-16: qty 0.6

## 2018-09-16 MED ORDER — DARBEPOETIN ALFA 300 MCG/0.6ML IJ SOSY
300.0000 ug | PREFILLED_SYRINGE | Freq: Once | INTRAMUSCULAR | Status: AC
  Administered 2018-09-16: 300 ug via SUBCUTANEOUS

## 2018-09-16 NOTE — Progress Notes (Signed)
Hematology and Oncology Follow Up Visit  Coda Filler [id number] 05-05-1956 62 y.o. 09/16/2018   Principle Diagnosis:  Erythropoietin deficiency anemia Alpha-Thalassemia   Current Therapy:   Aranesp 300 mcg SQ for Hgb < 11 Folic acid 1 mg PO daily   Interim History:  Ms. Michaelson is here today with her son for follow-up. She tried to apply for life insurance and was denied due to her anemia. They want her to have "normal counts" for 1 year prior to approval.  Hgb today is 9.2, MCV 75.7.  She is symptomatic at times with fatigue.  She sleeps well during the day and works nights at the [employer].  She has had no issue with infection. No fever, chills, cough, rash, dizziness, SOB, chest pain, palpitations, abdominal pain or changes in bowel or bladder habits.  She has found a supplement that helps with her nausea and this has improved quite a bit. She has not had any recent episodes of vomiting.  Her appetite is improving and she states that she is staying well hydrated. Her weight is stable.  No bleeding, bruising or petechiae.  No swelling, tenderness, numbness or tingling in her extremities. No c/o pain.   ECOG Performance Status: 1 - Symptomatic but completely ambulatory  Medications:  Allergies as of 09/16/2018      Reactions   Isovue [iopamidol] Hives   Pt broke out in several facial hives, one on her back.  She will need full premeds in the future.  J Bohm No allergy demonstrated to  Orally ingested Iodinated agent. (Gastrographin CT Scan 05-12-16) Pt broke out in several facial hives, one on her back.  She will need full premeds in the future.  J Bohm   Iodine-131 Rash      Medication List        Accurate as of 09/16/18  1:19 PM. Always use your most recent med list.          amLODipine 10 MG tablet Commonly known as:  NORVASC Take 1 tablet (10 mg total) by mouth daily.   atorvastatin 40 MG tablet Commonly known as:  LIPITOR Take 1 tablet (40 mg total) by mouth  daily.   Calcium Carbonate-Vitamin D 600-400 MG-UNIT tablet Take 1 tablet by mouth 2 (two) times daily.   folic acid 1 MG tablet Commonly known as:  FOLVITE Take 2 tablets (2 mg total) by mouth daily.   lisinopril 20 MG tablet Commonly known as:  PRINIVIL,ZESTRIL Take 1 tablet (20 mg total) by mouth daily.   Magnesium 100 MG Caps Take 1 tablet by mouth daily after breakfast.       Allergies:  Allergies  Allergen Reactions  . Isovue [Iopamidol] Hives    Pt broke out in several facial hives, one on her back.  She will need full premeds in the future.  J Bohm No allergy demonstrated to  Orally ingested Iodinated agent. (Gastrographin CT Scan 05-12-16) Pt broke out in several facial hives, one on her back.  She will need full premeds in the future.  J Bohm  . Iodine-131 Rash    Past Medical History, Surgical history, Social history, and Family History were reviewed and updated.  Review of Systems: All other 10 point review of systems is negative.   Physical Exam:  vitals were not taken for this visit.   Wt Readings from Last 3 Encounters:  03/12/18 125 lb (56.7 kg)  02/17/18 128 lb (58.1 kg)  11/28/17 133 lb 1.9 oz (60.4 kg)  Ocular: Sclerae unicteric, pupils equal, round and reactive to light Ear-nose-throat: Oropharynx clear, dentition fair Lymphatic: No cervical, supraclavicular or axillary adenopathy Lungs no rales or rhonchi, good excursion bilaterally Heart regular rate and rhythm, no murmur appreciated Abd soft, nontender, positive bowel sounds, no liver or spleen tip palpated on exam, no fluid wave  MSK no focal spinal tenderness, no joint edema Neuro: non-focal, well-oriented, appropriate affect Breasts: Deferred   Lab Results  Component Value Date   WBC 4.9 09/16/2018   HGB 9.2 (L) 09/16/2018   HCT 29.6 (L) 09/16/2018   MCV 75.7 (L) 09/16/2018   PLT 203 09/16/2018   Lab Results  Component Value Date   FERRITIN 110 11/28/2017   IRON 42 11/28/2017     TIBC 223 (L) 11/28/2017   UIBC 180 11/28/2017   IRONPCTSAT 19 (L) 11/28/2017   Lab Results  Component Value Date   RETICCTPCT <0.4 (L) 05/20/2016   RBC 3.91 09/16/2018   No results found for: KPAFRELGTCHN, LAMBDASER, KAPLAMBRATIO No results found for: IGGSERUM, IGA, IGMSERUM No results found for: Odetta Pink, SPEI   Chemistry      Component Value Date/Time   NA 140 02/17/2018 1519   NA 139 11/28/2017 1450   NA 140 10/28/2016 1127   K 4.0 02/17/2018 1519   K 3.9 11/28/2017 1450   K 3.3 (L) 10/28/2016 1127   CL 105 02/17/2018 1519   CL 99 11/28/2017 1450   CO2 28 02/17/2018 1519   CO2 31 11/28/2017 1450   CO2 26 10/28/2016 1127   BUN 16 02/17/2018 1519   BUN 19 11/28/2017 1450   BUN 12.4 10/28/2016 1127   CREATININE 1.13 (H) 02/17/2018 1519   CREATININE 1.0 10/28/2016 1127      Component Value Date/Time   CALCIUM 9.9 02/17/2018 1519   CALCIUM 9.5 11/28/2017 1450   CALCIUM 9.6 10/28/2016 1127   ALKPHOS 83 11/28/2017 1450   ALKPHOS 88 10/28/2016 1127   AST 29 02/17/2018 1519   AST 30 11/28/2017 1450   AST 32 10/28/2016 1127   ALT 7 02/17/2018 1519   ALT 19 11/28/2017 1450   ALT 9 10/28/2016 1127   BILITOT 0.4 02/17/2018 1519   BILITOT 0.60 11/28/2017 1450   BILITOT 0.55 10/28/2016 1127      Impression and Plan: Ms. Singleton is a very pleasant 62 yo African American female with erythropoietin deficiency anemia as well as alpha thalassemia. She has had some intermittent fatigue.  Hgb is 9.2 with an MCV of 75. She was given Aranesp today.  We will plan to check her labs and do an injection (if needed) monthly with follow-up in 3 months.  They are in agreement with the plan and will contact our office with any questions or concerns. We can certainly see her sooner if need be.   Laverna Peace, NP 10/2/20191:19 PM

## 2018-09-16 NOTE — Patient Instructions (Signed)

## 2018-09-17 LAB — IRON AND TIBC
Iron: 48 ug/dL (ref 41–142)
SATURATION RATIOS: 25 % (ref 21–57)
TIBC: 194 ug/dL — ABNORMAL LOW (ref 236–444)
UIBC: 146 ug/dL

## 2018-09-17 LAB — FERRITIN: Ferritin: 283 ng/mL (ref 11–307)

## 2018-10-21 ENCOUNTER — Ambulatory Visit: Payer: 59

## 2018-10-21 ENCOUNTER — Other Ambulatory Visit: Payer: 59

## 2018-11-16 ENCOUNTER — Telehealth: Payer: Self-pay | Admitting: Family

## 2018-11-16 NOTE — Telephone Encounter (Signed)
cxled per pt 12/2...Lori Kitchenpt says she only need lab/inj appt when she needs it only

## 2018-11-18 ENCOUNTER — Other Ambulatory Visit: Payer: 59

## 2018-11-18 ENCOUNTER — Ambulatory Visit: Payer: 59

## 2018-12-18 ENCOUNTER — Ambulatory Visit: Payer: 59

## 2018-12-18 ENCOUNTER — Other Ambulatory Visit: Payer: 59

## 2018-12-18 ENCOUNTER — Ambulatory Visit: Payer: 59 | Admitting: Family

## 2020-11-08 ENCOUNTER — Ambulatory Visit (INDEPENDENT_AMBULATORY_CARE_PROVIDER_SITE_OTHER): Payer: 59 | Admitting: Sports Medicine

## 2020-11-08 ENCOUNTER — Other Ambulatory Visit: Payer: Self-pay

## 2020-11-08 DIAGNOSIS — N611 Abscess of the breast and nipple: Secondary | ICD-10-CM

## 2020-11-08 DIAGNOSIS — Z1231 Encounter for screening mammogram for malignant neoplasm of breast: Secondary | ICD-10-CM

## 2020-11-08 DIAGNOSIS — Z0000 Encounter for general adult medical examination without abnormal findings: Secondary | ICD-10-CM

## 2020-11-08 MED ORDER — HYDROCODONE-ACETAMINOPHEN 5-325 MG PO TABS: 1.0000 | ORAL_TABLET | Freq: Three times a day (TID) | ORAL | 0 refills | Status: DC | PRN

## 2020-11-08 MED ORDER — DOXYCYCLINE HYCLATE 100 MG PO TABS: 100.0000 mg | ORAL_TABLET | Freq: Two times a day (BID) | ORAL | 0 refills | Status: AC

## 2020-11-08 NOTE — Addendum Note (Signed)
Addended by: Silverio Decamp on: 11/08/2020 02:50 PM   Modules accepted: Orders

## 2020-11-08 NOTE — Assessment & Plan Note (Signed)
Noted abscess in the right inframammary fold, examined procedure conducted with female chaperone, incision and drainage as above, dressed, hydrocodone and doxycycline. Aftercare advice, return to see me in 1 to 2 weeks for incision check. No packing done.

## 2020-11-08 NOTE — Progress Notes (Signed)
    Procedures performed today:    Simple incision and drainage of abscess. Risks, benefits, and alternatives explained and consent obtained. Time out conducted. Surface cleaned with alcohol. 5 cc lidocaine with epinephine infiltrated around abscess. Adequate anesthesia ensured. Area prepped and draped in a sterile fashion. #11 blade used to make a stab incision into abscess. Pus expressed with pressure. Curved hemostat used to explore 4 quadrants and loculations broken up. Further purulence expressed. Hemostasis achieved. Pt stable. Aftercare and follow-up advised.  Independent interpretation of notes and tests performed by another provider:   None.  Brief History, Exam, Impression, and Recommendations:    Abscess of skin of right inframammary fold Noted abscess in the right inframammary fold, examined procedure conducted with female chaperone, incision and drainage as above, dressed, hydrocodone and doxycycline. Aftercare advice, return to see me in 1 to 2 weeks for incision check. No packing done.    ___________________________________________ Gwen Her. Dianah Field, M.D., ABFM., CAQSM. Primary Care and [place] Instructor of [place] of [facility] of Medicine

## 2020-11-08 NOTE — Patient Instructions (Signed)
Incision and Drainage, Care After This sheet gives you information about how to care for yourself after your procedure. Your health care provider may also give you more specific instructions. If you have problems or questions, contact your health care provider. What can I expect after the procedure? After the procedure, it is common to have:  Pain or discomfort around the incision site.  Blood, fluid, or pus (drainage) from the incision.  Redness and firm skin around the incision site. Follow these instructions at home: Medicines  Take over-the-counter and prescription medicines only as told by your health care provider.  If you were prescribed an antibiotic medicine, use or take it as told by your health care provider. Do not stop using the antibiotic even if you start to feel better. Wound care Follow instructions from your health care provider about how to take care of your wound. Make sure you:  Wash your hands with soap and water before and after you change your bandage (dressing). If soap and water are not available, use hand sanitizer.  Change your dressing and packing as told by your health care provider. ? If your dressing is dry or stuck when you try to remove it, moisten or wet the dressing with saline or water so that it can be removed without harming your skin or tissues. ? If your wound is packed, leave it in place until your health care provider tells you to remove it. To remove the packing, moisten or wet the packing with saline or water so that it can be removed without harming your skin or tissues.  Leave stitches (sutures), skin glue, or adhesive strips in place. These skin closures may need to stay in place for 2 weeks or longer. If adhesive strip edges start to loosen and curl up, you may trim the loose edges. Do not remove adhesive strips completely unless your health care provider tells you to do that. Check your wound every day for signs of infection. Check  for:  More redness, swelling, or pain.  More fluid or blood.  Warmth.  Pus or a bad smell. If you were sent home with a drain tube in place, follow instructions from your health care provider about:  How to empty it.  How to care for it at home.  General instructions  Rest the affected area.  Do not take baths, swim, or use a hot tub until your health care provider approves. Ask your health care provider if you may take showers. You may only be allowed to take sponge baths.  Return to your normal activities as told by your health care provider. Ask your health care provider what activities are safe for you. Your health care provider may put you on activity or lifting restrictions.  The incision will continue to drain. It is normal to have some clear or slightly bloody drainage. The amount of drainage should lessen each day.  Do not apply any creams, ointments, or liquids unless you have been told to by your health care provider.  Keep all follow-up visits as told by your health care provider. This is important. Contact a health care provider if:  Your cyst or abscess returns.  You have a fever or chills.  You have more redness, swelling, or pain around your incision.  You have more fluid or blood coming from your incision.  Your incision feels warm to the touch.  You have pus or a bad smell coming from your incision.  You have red streaks   above or below the incision site. Get help right away if:  You have severe pain or bleeding.  You cannot eat or drink without vomiting.  You have decreased urine output.  You become short of breath.  You have chest pain.  You cough up blood.  The affected area becomes numb or starts to tingle. These symptoms may represent a serious problem that is an emergency. Do not wait to see if the symptoms will go away. Get medical help right away. Call your local emergency services (911 in the [place] Do not drive yourself to the  hospital. Summary  After this procedure, it is common to have fluid, blood, or pus coming from the surgery site.  Follow all home care instructions. You will be told how to take care of your incision, how to check for infection, and how to take medicines.  If you were prescribed an antibiotic medicine, take it as told by your health care provider. Do not stop taking the antibiotic even if you start to feel better.  Contact a health care provider if you have increased redness, swelling, or pain around your incision. Get help right away if you have chest pain, you vomit, you cough up blood, or you have shortness of breath.  Keep all follow-up visits as told by your health care provider. This is important. This information is not intended to replace advice given to you by your health care provider. Make sure you discuss any questions you have with your health care provider. Document Revised: 11/02/2018 Document Reviewed: 11/02/2018 Elsevier Patient Education  2020 [employer]vier Inc.   

## 2020-11-15 ENCOUNTER — Ambulatory Visit: Payer: 59 | Admitting: Sports Medicine

## 2020-11-21 ENCOUNTER — Other Ambulatory Visit: Payer: Self-pay

## 2020-11-21 ENCOUNTER — Encounter: Payer: Self-pay | Admitting: Sports Medicine

## 2020-11-21 ENCOUNTER — Ambulatory Visit (INDEPENDENT_AMBULATORY_CARE_PROVIDER_SITE_OTHER): Payer: 59 | Admitting: Sports Medicine

## 2020-11-21 DIAGNOSIS — N611 Abscess of the breast and nipple: Secondary | ICD-10-CM

## 2020-11-21 MED ORDER — SULFAMETHOXAZOLE-TRIMETHOPRIM 800-160 MG PO TABS: 1.0000 | ORAL_TABLET | Freq: Two times a day (BID) | ORAL | 0 refills | Status: DC

## 2020-11-21 NOTE — Assessment & Plan Note (Signed)
At the last visit we did an incision and drainage of an inframammary fold abscess. Today it still present with a bit of drainage, we expressed some of the purulence, redressed it, I am going to add 10 days of sulfamethoxazole and trimethoprim which should have good efficacy against MRSA, she will do warm compresses, massage and return to see me in about a week or 2.

## 2020-11-21 NOTE — Progress Notes (Signed)
    Procedures performed today:    None.  Independent interpretation of notes and tests performed by another provider:   None.  Brief History, Exam, Impression, and Recommendations:    Abscess of skin of right inframammary fold At the last visit we did an incision and drainage of an inframammary fold abscess. Today it still present with a bit of drainage, we expressed some of the purulence, redressed it, I am going to add 10 days of sulfamethoxazole and trimethoprim which should have good efficacy against MRSA, she will do warm compresses, massage and return to see me in about a week or 2.    ___________________________________________ Gwen Her. Dianah Field, M.D., ABFM., CAQSM. Primary Care and [place] Instructor of [place] of [facility] of Medicine

## 2020-12-05 ENCOUNTER — Ambulatory Visit: Payer: 59 | Admitting: Sports Medicine

## 2020-12-12 ENCOUNTER — Other Ambulatory Visit: Payer: Self-pay

## 2020-12-12 ENCOUNTER — Ambulatory Visit (INDEPENDENT_AMBULATORY_CARE_PROVIDER_SITE_OTHER): Payer: 59 | Admitting: Sports Medicine

## 2020-12-12 DIAGNOSIS — Z0000 Encounter for general adult medical examination without abnormal findings: Secondary | ICD-10-CM

## 2020-12-12 DIAGNOSIS — E785 Hyperlipidemia, unspecified: Secondary | ICD-10-CM

## 2020-12-12 DIAGNOSIS — N611 Abscess of the breast and nipple: Secondary | ICD-10-CM

## 2020-12-12 NOTE — Assessment & Plan Note (Addendum)
Routine labs ordered, she does need to get her breast cancer screening. She also needs to return for an annual routine physical exam.

## 2020-12-12 NOTE — Progress Notes (Signed)
    Procedures performed today:    None.  Independent interpretation of notes and tests performed by another provider:   None.  Brief History, Exam, Impression, and Recommendations:    Abscess of skin of right inframammary fold At the last visit this pleasant 64 year old female who had had an I&D was still having some purulent drainage from an abscess under her right inframammary fold. We expressed further purulence and added 10 days of sulfamethoxazole and trimethoprim, she returns today with near complete resolution, there is a knot in the location of the abscess, she will massage this every day multiple times a day, I do not think she needs any other antibiotics, return as needed. Exam conducted with a female chaperone.  Annual physical exam Routine labs ordered, she does need to get her breast cancer screening. She also needs to return for an annual routine physical exam.    ___________________________________________ Gwen Her. Dianah Field, M.D., ABFM., CAQSM. Primary Care and [place] Instructor of [place] of [facility] of Medicine

## 2020-12-12 NOTE — Assessment & Plan Note (Signed)
At the last visit this pleasant 64 year old female who had had an I&D was still having some purulent drainage from an abscess under her right inframammary fold. We expressed further purulence and added 10 days of sulfamethoxazole and trimethoprim, she returns today with near complete resolution, there is a knot in the location of the abscess, she will massage this every day multiple times a day, I do not think she needs any other antibiotics, return as needed. Exam conducted with a female chaperone.

## 2021-01-25 ENCOUNTER — Ambulatory Visit: Payer: 59

## 2022-03-27 ENCOUNTER — Encounter: Payer: 59 | Admitting: Sports Medicine

## 2022-05-22 ENCOUNTER — Telehealth: Payer: Self-pay

## 2022-05-22 DIAGNOSIS — R112 Nausea with vomiting, unspecified: Secondary | ICD-10-CM

## 2022-05-22 NOTE — Telephone Encounter (Signed)
Patient's son called requesting a referral for a gastro at [facility].

## 2022-05-23 ENCOUNTER — Inpatient Hospital Stay (HOSPITAL_BASED_OUTPATIENT_CLINIC_OR_DEPARTMENT_OTHER)
Admission: EM | Admit: 2022-05-23 | Discharge: 2022-05-26 | DRG: 683 | Disposition: A | Discharge status: Home / Self Care | Payer: 59 | Attending: Internal Medicine | Admitting: Internal Medicine

## 2022-05-23 ENCOUNTER — Other Ambulatory Visit: Payer: Self-pay

## 2022-05-23 ENCOUNTER — Emergency Department (HOSPITAL_BASED_OUTPATIENT_CLINIC_OR_DEPARTMENT_OTHER): Payer: 59

## 2022-05-23 ENCOUNTER — Encounter (HOSPITAL_BASED_OUTPATIENT_CLINIC_OR_DEPARTMENT_OTHER): Payer: Self-pay | Admitting: Emergency Medicine

## 2022-05-23 DIAGNOSIS — Z79899 Other long term (current) drug therapy: Secondary | ICD-10-CM | POA: Diagnosis not present

## 2022-05-23 DIAGNOSIS — E860 Dehydration: Secondary | ICD-10-CM | POA: Diagnosis not present

## 2022-05-23 DIAGNOSIS — E876 Hypokalemia: Secondary | ICD-10-CM | POA: Diagnosis not present

## 2022-05-23 DIAGNOSIS — R4182 Altered mental status, unspecified: Secondary | ICD-10-CM | POA: Diagnosis present

## 2022-05-23 DIAGNOSIS — I129 Hypertensive chronic kidney disease with stage 1 through stage 4 chronic kidney disease, or unspecified chronic kidney disease: Secondary | ICD-10-CM | POA: Diagnosis present

## 2022-05-23 DIAGNOSIS — D631 Anemia in chronic kidney disease: Secondary | ICD-10-CM | POA: Diagnosis present

## 2022-05-23 DIAGNOSIS — E871 Hypo-osmolality and hyponatremia: Secondary | ICD-10-CM

## 2022-05-23 DIAGNOSIS — Z83438 Family history of other disorder of lipoprotein metabolism and other lipidemia: Secondary | ICD-10-CM

## 2022-05-23 DIAGNOSIS — I10 Essential (primary) hypertension: Secondary | ICD-10-CM | POA: Diagnosis present

## 2022-05-23 DIAGNOSIS — E162 Hypoglycemia, unspecified: Secondary | ICD-10-CM | POA: Diagnosis not present

## 2022-05-23 DIAGNOSIS — E785 Hyperlipidemia, unspecified: Secondary | ICD-10-CM | POA: Diagnosis not present

## 2022-05-23 DIAGNOSIS — C7951 Secondary malignant neoplasm of bone: Secondary | ICD-10-CM | POA: Diagnosis not present

## 2022-05-23 DIAGNOSIS — Z681 Body mass index (BMI) 19 or less, adult: Secondary | ICD-10-CM | POA: Diagnosis not present

## 2022-05-23 DIAGNOSIS — N179 Acute kidney failure, unspecified: Secondary | ICD-10-CM | POA: Diagnosis not present

## 2022-05-23 DIAGNOSIS — D696 Thrombocytopenia, unspecified: Secondary | ICD-10-CM | POA: Diagnosis not present

## 2022-05-23 DIAGNOSIS — D509 Iron deficiency anemia, unspecified: Secondary | ICD-10-CM | POA: Diagnosis present

## 2022-05-23 DIAGNOSIS — Z823 Family history of stroke: Secondary | ICD-10-CM | POA: Diagnosis not present

## 2022-05-23 DIAGNOSIS — R197 Diarrhea, unspecified: Secondary | ICD-10-CM | POA: Diagnosis present

## 2022-05-23 DIAGNOSIS — Z833 Family history of diabetes mellitus: Secondary | ICD-10-CM

## 2022-05-23 DIAGNOSIS — R918 Other nonspecific abnormal finding of lung field: Secondary | ICD-10-CM | POA: Diagnosis not present

## 2022-05-23 DIAGNOSIS — C799 Secondary malignant neoplasm of unspecified site: Secondary | ICD-10-CM | POA: Diagnosis present

## 2022-05-23 DIAGNOSIS — R636 Underweight: Secondary | ICD-10-CM | POA: Diagnosis present

## 2022-05-23 DIAGNOSIS — N184 Chronic kidney disease, stage 4 (severe): Secondary | ICD-10-CM | POA: Diagnosis present

## 2022-05-23 DIAGNOSIS — D563 Thalassemia minor: Secondary | ICD-10-CM | POA: Diagnosis not present

## 2022-05-23 DIAGNOSIS — N1832 Chronic kidney disease, stage 3b: Secondary | ICD-10-CM | POA: Insufficient documentation

## 2022-05-23 DIAGNOSIS — N1831 Chronic kidney disease, stage 3a: Secondary | ICD-10-CM

## 2022-05-23 DIAGNOSIS — D560 Alpha thalassemia: Secondary | ICD-10-CM

## 2022-05-23 DIAGNOSIS — J189 Pneumonia, unspecified organism: Secondary | ICD-10-CM

## 2022-05-23 DIAGNOSIS — M899 Disorder of bone, unspecified: Secondary | ICD-10-CM

## 2022-05-23 LAB — CBC WITH DIFFERENTIAL/PLATELET
Abs Immature Granulocytes: 0.03 10*3/uL (ref 0.00–0.07)
Basophils Absolute: 0 10*3/uL (ref 0.0–0.1)
Basophils Relative: 0 %
Eosinophils Absolute: 0 10*3/uL (ref 0.0–0.5)
Eosinophils Relative: 0 %
HCT: 39.2 % (ref 36.0–46.0)
Hemoglobin: 12.4 g/dL (ref 12.0–15.0)
Immature Granulocytes: 0 %
Lymphocytes Relative: 14 %
Lymphs Abs: 1.1 10*3/uL (ref 0.7–4.0)
MCH: 22.9 pg — ABNORMAL LOW (ref 26.0–34.0)
MCHC: 31.6 g/dL (ref 30.0–36.0)
MCV: 72.3 fL — ABNORMAL LOW (ref 80.0–100.0)
Monocytes Absolute: 0.3 10*3/uL (ref 0.1–1.0)
Monocytes Relative: 4 %
Neutro Abs: 6.4 10*3/uL (ref 1.7–7.7)
Neutrophils Relative %: 82 %
Platelets: 126 10*3/uL — ABNORMAL LOW (ref 150–400)
RBC: 5.42 MIL/uL — ABNORMAL HIGH (ref 3.87–5.11)
RDW: 12.8 % (ref 11.5–15.5)
Smear Review: NORMAL
WBC: 7.9 10*3/uL (ref 4.0–10.5)
nRBC: 0 % (ref 0.0–0.2)

## 2022-05-23 LAB — URINALYSIS, ROUTINE W REFLEX MICROSCOPIC
Bilirubin Urine: NEGATIVE
Glucose, UA: NEGATIVE mg/dL
Hgb urine dipstick: NEGATIVE
Ketones, ur: 40 mg/dL — AB
Leukocytes,Ua: NEGATIVE
Nitrite: NEGATIVE
Protein, ur: 30 mg/dL — AB
Specific Gravity, Urine: 1.018 (ref 1.005–1.030)
pH: 5.5 (ref 5.0–8.0)

## 2022-05-23 LAB — COMPREHENSIVE METABOLIC PANEL
ALT: 13 U/L (ref 0–44)
AST: 68 U/L — ABNORMAL HIGH (ref 15–41)
Albumin: 4.3 g/dL (ref 3.5–5.0)
Alkaline Phosphatase: 66 U/L (ref 38–126)
Anion gap: 19 — ABNORMAL HIGH (ref 5–15)
BUN: 17 mg/dL (ref 8–23)
CO2: 24 mmol/L (ref 22–32)
Calcium: 9.7 mg/dL (ref 8.9–10.3)
Chloride: 87 mmol/L — ABNORMAL LOW (ref 98–111)
Creatinine, Ser: 1.3 mg/dL — ABNORMAL HIGH (ref 0.44–1.00)
GFR, Estimated: 45 mL/min — ABNORMAL LOW (ref >60)
Glucose, Bld: 41 mg/dL — CL (ref 70–99)
Potassium: 4.5 mmol/L (ref 3.5–5.1)
Sodium: 130 mmol/L — ABNORMAL LOW (ref 135–145)
Total Bilirubin: 1.3 mg/dL — ABNORMAL HIGH (ref 0.3–1.2)
Total Protein: 8.6 g/dL — ABNORMAL HIGH (ref 6.5–8.1)

## 2022-05-23 LAB — CBG MONITORING, ED
Glucose-Capillary: 199 mg/dL — ABNORMAL HIGH (ref 70–99)
Glucose-Capillary: 94 mg/dL (ref 70–99)

## 2022-05-23 LAB — LIPASE, BLOOD: Lipase: <10 U/L — ABNORMAL LOW (ref 11–51)

## 2022-05-23 LAB — MAGNESIUM: Magnesium: 1.7 mg/dL (ref 1.7–2.4)

## 2022-05-23 MED ORDER — SODIUM CHLORIDE 0.9 % IV SOLN
500.0000 mg | Freq: Once | INTRAVENOUS | Status: AC
  Administered 2022-05-23: 500 mg via INTRAVENOUS
  Filled 2022-05-23: qty 5

## 2022-05-23 MED ORDER — SODIUM CHLORIDE 0.9 % IV SOLN: INTRAVENOUS | Status: DC

## 2022-05-23 MED ORDER — DEXTROSE-NACL 5-0.9 % IV SOLN: INTRAVENOUS | Status: DC

## 2022-05-23 MED ORDER — DEXTROSE 50 % IV SOLN: 1.0000 | Freq: Once | INTRAVENOUS | Status: AC

## 2022-05-23 MED ORDER — DEXTROSE 50 % IV SOLN
INTRAVENOUS | Status: AC
  Administered 2022-05-23: 50 mL via INTRAVENOUS
  Filled 2022-05-23: qty 50

## 2022-05-23 MED ORDER — ONDANSETRON HCL 4 MG/2ML IJ SOLN
4.0000 mg | Freq: Once | INTRAMUSCULAR | Status: AC
  Administered 2022-05-23: 4 mg via INTRAVENOUS
  Filled 2022-05-23: qty 2

## 2022-05-23 MED ORDER — CEFTRIAXONE SODIUM 1 G IJ SOLR
1.0000 g | Freq: Once | INTRAMUSCULAR | Status: AC
  Administered 2022-05-23: 1 g via INTRAVENOUS
  Filled 2022-05-23: qty 10

## 2022-05-23 MED ORDER — SODIUM CHLORIDE 0.9 % IV BOLUS
1000.0000 mL | Freq: Once | INTRAVENOUS | Status: AC
  Administered 2022-05-23: 1000 mL via INTRAVENOUS

## 2022-05-23 NOTE — ED Provider Notes (Signed)
[facility] Provider Note   CSN: [id number] Arrival date & time: 05/23/22  1459     History  Chief Complaint  Patient presents with   Fatigue    [name] is a 66 y.o. female.  Patient brought in by her son.  For being very drowsy not eating or drinking for about a week he says.  She normally works overnight sleeping during the daytime is not unusual but she has been going to work all week is at that is extremely unusual.  Patient states she just is nauseated does not have a good appetite.  Son denied any diarrhea or any true vomiting to me.  That was mentioned out in triage.  Patient just states that she feels nauseated and does not want to eat.  Denied any pain other than maybe a little bit of slight generalized abdominal pain.  Past medical history is significant for hypertension hyperlipidemia history of abdominal pain alpha thalassemia minor trait and and a anemia.  Past surgical history significant for gallbladder removal.         Home Medications Prior to Admission medications   Medication Sig Start Date End Date Taking? Authorizing Provider  amLODipine (NORVASC) 10 MG tablet Take 1 tablet (10 mg total) by mouth daily. 01/13/17   Silverio Decamp, MD  atorvastatin (LIPITOR) 40 MG tablet Take 1 tablet (40 mg total) by mouth daily. 02/18/18   Silverio Decamp, MD  Calcium Carbonate-Vitamin D 600-400 MG-UNIT tablet Take 1 tablet by mouth 2 (two) times daily. 03/12/18   Trixie Dredge, PA-C  folic acid (FOLVITE) 1 MG tablet Take 2 tablets (2 mg total) by mouth daily. 09/16/18   Celso Amy, NP  lisinopril (PRINIVIL,ZESTRIL) 20 MG tablet Take 1 tablet (20 mg total) by mouth daily. 02/17/18   Silverio Decamp, MD  Magnesium 100 MG CAPS Take 1 tablet by mouth daily after breakfast.    [provider]      Allergies    Isovue [iopamidol] and Iodine-131    Review of Systems   Review of Systems  Unable to  perform ROS: Mental status change    Physical Exam Updated Vital Signs BP 102/72   Pulse 77   Temp (!) 97.4 F (36.3 C) (Oral)   Resp 18   SpO2 98%  Physical Exam Vitals and nursing note reviewed.  Constitutional:      General: She is not in acute distress.    Appearance: Normal appearance. She is well-developed.  HENT:     Head: Normocephalic and atraumatic.     Mouth/Throat:     Mouth: Mucous membranes are dry.  Eyes:     Extraocular Movements: Extraocular movements intact.     Conjunctiva/sclera: Conjunctivae normal.     Pupils: Pupils are equal, round, and reactive to light.  Cardiovascular:     Rate and Rhythm: Normal rate and regular rhythm.     Heart sounds: No murmur heard. Pulmonary:     Effort: Pulmonary effort is normal. No respiratory distress.     Breath sounds: Normal breath sounds.  Abdominal:     General: There is no distension.     Palpations: Abdomen is soft.     Tenderness: There is no abdominal tenderness. There is no guarding.  Musculoskeletal:        General: No swelling.     Cervical back: Normal range of motion and neck supple.  Skin:    General: Skin is warm and dry.  Capillary Refill: Capillary refill takes less than 2 seconds.  Neurological:     General: No focal deficit present.     Mental Status: She is alert.     Cranial Nerves: No cranial nerve deficit.     Sensory: No sensory deficit.     Motor: No weakness.     Comments: Patient awake and alert and will follow commands precisely.  Not particularly verbal but will communicate appropriately.  Still states she is very fatigued and feeling drowsy.  Psychiatric:        Mood and Affect: Mood normal.     ED Results / Procedures / Treatments   Labs (all labs ordered are listed, but only abnormal results are displayed) Labs Reviewed  LIPASE, BLOOD - Abnormal; Notable for the following components:      Result Value   Lipase <10 (*)    All other components within normal limits   COMPREHENSIVE METABOLIC PANEL - Abnormal; Notable for the following components:   Sodium 130 (*)    Chloride 87 (*)    Glucose, Bld 41 (*)    Creatinine, Ser 1.30 (*)    Total Protein 8.6 (*)    AST 68 (*)    Total Bilirubin 1.3 (*)    GFR, Estimated 45 (*)    Anion gap 19 (*)    All other components within normal limits  CBC WITH DIFFERENTIAL/PLATELET - Abnormal; Notable for the following components:   RBC 5.42 (*)    MCV 72.3 (*)    MCH 22.9 (*)    Platelets 126 (*)    All other components within normal limits  URINALYSIS, ROUTINE W REFLEX MICROSCOPIC - Abnormal; Notable for the following components:   Ketones, ur 40 (*)    Protein, ur 30 (*)    All other components within normal limits  CBG MONITORING, ED - Abnormal; Notable for the following components:   Glucose-Capillary 199 (*)    All other components within normal limits  MAGNESIUM  CBG MONITORING, ED    EKG EKG Interpretation  Date/Time:  Thursday May 23 2022 19:24:06 EDT Ventricular Rate:  85 PR Interval:  115 QRS Duration: 79 QT Interval:  376 QTC Calculation: 448 R Axis:   78 Text Interpretation: Ectopic atrial rhythm Borderline short PR interval Borderline repolarization abnormality Baseline wander in lead(s) V3 Confirmed by Fredia Sorrow [phone]) on 05/23/2022 7:33:25 PM  Radiology CT Chest Wo Contrast  Result Date: 05/23/2022 CLINICAL DATA:  Pneumonia. EXAM: CT CHEST WITHOUT CONTRAST TECHNIQUE: Multidetector CT imaging of the chest was performed following the standard protocol without IV contrast. RADIATION DOSE REDUCTION: This exam was performed according to the departmental dose-optimization program which includes automated exposure control, adjustment of the mA and/or kV according to patient size and/or use of iterative reconstruction technique. COMPARISON:  Chest x-ray same day FINDINGS: Cardiovascular: No significant vascular findings. Normal heart size. No pericardial effusion. There are  atherosclerotic calcifications of the aorta and coronary arteries. Mediastinum/Nodes: No enlarged mediastinal or axillary lymph nodes. There are few tiny foci of air inferior to the carina, indeterminate. There is likely a tracheal or esophageal diverticulum on the right at the level of the thoracic inlet measuring 8 x 13 by 11 mm. Thyroid gland, trachea, and esophagus demonstrate no significant findings. Lungs/Pleura: There are minimal patchy ground-glass opacities in the right lower lobe/lung base. The lungs are otherwise clear. There is no pleural effusion or pneumothorax. Upper Abdomen: Cholecystectomy clips are present. Musculoskeletal: There are innumerable sclerotic osseous lesions  seen throughout the spine measuring less than 1 cm. No acute fractures are seen. IMPRESSION: 1. Minimal patchy ground-glass opacities in the right lower lobe, likely infectious/inflammatory. 2. Small amount of air inferior to the carina may represent pneumomediastinum versus small tracheal diverticula. 3. 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 4. Numerous sclerotic osseous lesions worrisome for metastatic disease. Electronically Signed   By: Ronney Asters M.D.   On: 05/23/2022 21:22   CT Head Wo Contrast  Result Date: 05/23/2022 CLINICAL DATA:  Mental status change, unknown cause EXAM: CT HEAD WITHOUT CONTRAST TECHNIQUE: Contiguous axial images were obtained from the base of the skull through the vertex without intravenous contrast. RADIATION DOSE REDUCTION: This exam was performed according to the departmental dose-optimization program which includes automated exposure control, adjustment of the mA and/or kV according to patient size and/or use of iterative reconstruction technique. COMPARISON:  None Available. FINDINGS: Brain: No evidence of acute intracranial hemorrhage or extra-axial collection.No evidence of mass lesion/concerning mass effect.The ventricles are normal in  size. Vascular: No hyperdense vessel or unexpected calcification. Skull: Normal. Negative for fracture or focal lesion. Sinuses/Orbits: Mild mucosal thickening of the maxillary sinuses. Other: None. IMPRESSION: No acute intracranial abnormality. Electronically Signed   By: Maurine Simmering M.D.   On: 05/23/2022 18:47   CT Abdomen Pelvis Wo Contrast  Result Date: 05/23/2022 CLINICAL DATA:  Nonlocalized abdominal pain. EXAM: CT ABDOMEN AND PELVIS WITHOUT CONTRAST TECHNIQUE: Multidetector CT imaging of the abdomen and pelvis was performed following the standard protocol without IV contrast. RADIATION DOSE REDUCTION: This exam was performed according to the departmental dose-optimization program which includes automated exposure control, adjustment of the mA and/or kV according to patient size and/or use of iterative reconstruction technique. COMPARISON:  05/12/2016 FINDINGS: Lower chest: Clear lung bases. Normal heart size without pericardial or pleural effusion. Right coronary artery calcification. Hepatobiliary: Normal noncontrast appearance of the liver. Cholecystectomy, without biliary ductal dilatation. Pancreas: Normal, without mass or ductal dilatation. Spleen: New subtle splenic volume loss and hypoattenuation, including on 11/02. No perisplenic fluid. Adrenals/Urinary Tract: Normal adrenal glands. No renal calculi or hydronephrosis. No hydroureter or ureteric calculi. No bladder calculi. Stomach/Bowel: Normal stomach, without wall thickening. Normal colon and terminal ileum. Appendix not visualized. Normal small bowel. Vascular/Lymphatic: Aortic atherosclerosis. No abdominopelvic adenopathy. Reproductive: Normal uterus and adnexa. Other: No free intraperitoneal air. Small volume pelvic fluid including on 54/2 is nonspecific. Musculoskeletal: Development of innumerable tiny sclerotic lesions throughout the pelvis and thoracolumbar spine. Trace L4-5 anterolisthesis. IMPRESSION: 1. Development of multiple sclerotic  foci throughout the marrow space, highly suspicious for metastatic disease. Correlate with primary malignancy history and consider further evaluation with PET. 2. Given limitations of lack of oral or IV contrast, no acute process in the abdomen or pelvis. 3. Small volume free pelvic fluid, abnormal for age but nonspecific. 4. Hypoattenuation and volume loss within the spleen, new since 2017 and possibly related to small volume infarct. No perisplenic fluid to confirm acute or subacute time course. 5. Coronary artery atherosclerosis. Aortic Atherosclerosis (ICD10-I70.0). Electronically Signed   By: Abigail Miyamoto M.D.   On: 05/23/2022 18:11   DG Chest Port 1 View  Result Date: 05/23/2022 CLINICAL DATA:  AMS EXAM: PORTABLE CHEST 1 VIEW COMPARISON:  None Available. FINDINGS: The heart size and mediastinal contours are within normal limits. Both lungs are clear. No focal consolidation, pleural effusion or vascular congestion. The visualized skeletal structures are unremarkable. IMPRESSION: No active disease. Electronically Signed   By: Hyman Hopes  Amaresh M.D.   On: 05/23/2022 18:02    Procedures Procedures    Medications Ordered in ED Medications  dextrose 5 %-0.9 % sodium chloride infusion ( Intravenous New Bag/Given 05/23/22 1813)  azithromycin (ZITHROMAX) 500 mg in sodium chloride 0.9 % 250 mL IVPB (500 mg Intravenous New Bag/Given 05/23/22 2334)  sodium chloride 0.9 % bolus 1,000 mL (0 mLs Intravenous Stopped 05/23/22 1810)  dextrose 50 % solution 50 mL (50 mLs Intravenous Given 05/23/22 1811)  ondansetron (ZOFRAN) injection 4 mg (4 mg Intravenous Given 05/23/22 1926)  cefTRIAXone (ROCEPHIN) 1 g in sodium chloride 0.9 % 100 mL IVPB (0 g Intravenous Stopped 05/23/22 2334)    ED Course/ Medical Decision Making/ A&P                           Medical Decision Making Amount and/or Complexity of Data Reviewed Labs: ordered. Radiology: ordered.  Risk Prescription drug management. Decision regarding  hospitalization.   CRITICAL CARE Performed by: Fredia Sorrow Total critical care time: 45 minutes Critical care time was exclusive of separately billable procedures and treating other patients. Critical care was necessary to treat or prevent imminent or life-threatening deterioration. Critical care was time spent personally by me on the following activities: development of treatment plan with patient and/or surrogate as well as nursing, discussions with consultants, evaluation of patient's response to treatment, examination of patient, obtaining history from patient or surrogate, ordering and performing treatments and interventions, ordering and review of laboratory studies, ordering and review of radiographic studies, pulse oximetry and re-evaluation of patient's condition.  Patient's initial blood sugar was 41.  For this patient got an amp of D50 and started on D5 half-normal saline recent blood sugar check 94.  So is holding.  Figured patient was going to need admission for the inability to control her blood sugar.  And she is still not wanting to eat or drink.  Patient's lipase was normal.  Complete metabolic panel other than the glucose of 41 was significant for sodium 130 chloride of 87 creatinine at 1.3 giving a GFR 45 the patient's had creatinine of 1.4 in the past total bili was 1.3.  Anion gap of 10 CBC no leukocytosis hemoglobin normal at 12.4 urinalysis negative magnesium normal at 1.7 patient's albumin was normal.  Patient's calcium was normal.  CT scan of the abdomen had no intra-abdominal acute findings.  But raised concerns for multiple sclerotic foci throughout the marrow highly suspicious for metastatic disease.  Patient's chest x-ray was negative but because of this finding head CT chest CT head.  CT head without any acute findings.  CT chest raises some concerns for a pneumonia with patchy groundglass opacities right lower lobe.  And then also did show numerous sclerotic osseous  lesions worrisome for metastatic disease.  Careful reexam prickly of her breast showed no evidence of any lesions.  Or masses.  Not sure what is causing the metastatic sclerotic changes.  Patient treated with Rocephin and Zithromax for the pneumonia patient does not have an oxygen requirement.  Oxygen sats have been in the upper 90s.  Discussed with the hospitalist who will admit for the low blood sugar and the presumed bony metastatic disease and for the right lower lobe pneumonia.  Final Clinical Impression(s) / ED Diagnoses Final diagnoses:  Hypoglycemia  Lytic bone lesions on xray  Community acquired pneumonia of right lower lobe of lung    Rx / DC Orders ED Discharge Orders  None         Fredia Sorrow, MD 05/23/22 [phone]

## 2022-05-23 NOTE — ED Notes (Signed)
Patient transported to CT 

## 2022-05-23 NOTE — ED Triage Notes (Signed)
Pt has had diarrhea, poor appetite for 2 days, has been fatigued, weak, hard to arouse and abdominal pain, when she does answer there is a delay. Her baseline is alert and oriented , still works.

## 2022-05-23 NOTE — ED Notes (Signed)
Lab called for add on mag level

## 2022-05-23 NOTE — ED Notes (Signed)
Zackowski MD notified of patients presentation.

## 2022-05-23 NOTE — ED Notes (Signed)
CRITICAL VALUE STICKER  CRITICAL VALUE:Glucose 67  RECEIVER (on-site recipient of call):Shawnie Pons, RN  DATE & TIME NOTIFIED:   MESSENGER (representative from lab):  MD NOTIFIED: Dr. Rogene Houston and nurse Terrence Dupont  TIME OF NOTIFICATION:1806  RESPONSE:

## 2022-05-24 ENCOUNTER — Encounter (HOSPITAL_COMMUNITY): Payer: Self-pay | Admitting: Internal Medicine

## 2022-05-24 DIAGNOSIS — I129 Hypertensive chronic kidney disease with stage 1 through stage 4 chronic kidney disease, or unspecified chronic kidney disease: Secondary | ICD-10-CM | POA: Diagnosis present

## 2022-05-24 DIAGNOSIS — E162 Hypoglycemia, unspecified: Secondary | ICD-10-CM | POA: Diagnosis present

## 2022-05-24 DIAGNOSIS — E860 Dehydration: Secondary | ICD-10-CM | POA: Diagnosis present

## 2022-05-24 DIAGNOSIS — D563 Thalassemia minor: Secondary | ICD-10-CM | POA: Diagnosis present

## 2022-05-24 DIAGNOSIS — J189 Pneumonia, unspecified organism: Secondary | ICD-10-CM | POA: Diagnosis not present

## 2022-05-24 DIAGNOSIS — R197 Diarrhea, unspecified: Secondary | ICD-10-CM | POA: Diagnosis present

## 2022-05-24 DIAGNOSIS — N1831 Chronic kidney disease, stage 3a: Secondary | ICD-10-CM

## 2022-05-24 DIAGNOSIS — N1832 Chronic kidney disease, stage 3b: Secondary | ICD-10-CM | POA: Insufficient documentation

## 2022-05-24 DIAGNOSIS — D696 Thrombocytopenia, unspecified: Secondary | ICD-10-CM | POA: Diagnosis present

## 2022-05-24 DIAGNOSIS — Z681 Body mass index (BMI) 19 or less, adult: Secondary | ICD-10-CM | POA: Diagnosis not present

## 2022-05-24 DIAGNOSIS — Z79899 Other long term (current) drug therapy: Secondary | ICD-10-CM | POA: Diagnosis not present

## 2022-05-24 DIAGNOSIS — M899 Disorder of bone, unspecified: Secondary | ICD-10-CM

## 2022-05-24 DIAGNOSIS — R918 Other nonspecific abnormal finding of lung field: Secondary | ICD-10-CM | POA: Diagnosis present

## 2022-05-24 DIAGNOSIS — N179 Acute kidney failure, unspecified: Secondary | ICD-10-CM | POA: Diagnosis present

## 2022-05-24 DIAGNOSIS — Z823 Family history of stroke: Secondary | ICD-10-CM | POA: Diagnosis not present

## 2022-05-24 DIAGNOSIS — D509 Iron deficiency anemia, unspecified: Secondary | ICD-10-CM | POA: Diagnosis present

## 2022-05-24 DIAGNOSIS — D631 Anemia in chronic kidney disease: Secondary | ICD-10-CM | POA: Diagnosis present

## 2022-05-24 DIAGNOSIS — E871 Hypo-osmolality and hyponatremia: Secondary | ICD-10-CM

## 2022-05-24 DIAGNOSIS — Z83438 Family history of other disorder of lipoprotein metabolism and other lipidemia: Secondary | ICD-10-CM | POA: Diagnosis not present

## 2022-05-24 DIAGNOSIS — R4182 Altered mental status, unspecified: Secondary | ICD-10-CM | POA: Diagnosis present

## 2022-05-24 DIAGNOSIS — E876 Hypokalemia: Secondary | ICD-10-CM | POA: Diagnosis present

## 2022-05-24 DIAGNOSIS — Z833 Family history of diabetes mellitus: Secondary | ICD-10-CM | POA: Diagnosis not present

## 2022-05-24 DIAGNOSIS — R636 Underweight: Secondary | ICD-10-CM | POA: Diagnosis present

## 2022-05-24 DIAGNOSIS — N184 Chronic kidney disease, stage 4 (severe): Secondary | ICD-10-CM | POA: Diagnosis present

## 2022-05-24 DIAGNOSIS — C7951 Secondary malignant neoplasm of bone: Secondary | ICD-10-CM

## 2022-05-24 DIAGNOSIS — E785 Hyperlipidemia, unspecified: Secondary | ICD-10-CM | POA: Diagnosis present

## 2022-05-24 LAB — CBC WITH DIFFERENTIAL/PLATELET
Abs Immature Granulocytes: 0.02 10*3/uL (ref 0.00–0.07)
Basophils Absolute: 0 10*3/uL (ref 0.0–0.1)
Basophils Relative: 0 %
Eosinophils Absolute: 0 10*3/uL (ref 0.0–0.5)
Eosinophils Relative: 0 %
HCT: 30.5 % — ABNORMAL LOW (ref 36.0–46.0)
Hemoglobin: 9.8 g/dL — ABNORMAL LOW (ref 12.0–15.0)
Immature Granulocytes: 0 %
Lymphocytes Relative: 28 %
Lymphs Abs: 1.3 10*3/uL (ref 0.7–4.0)
MCH: 23.3 pg — ABNORMAL LOW (ref 26.0–34.0)
MCHC: 32.1 g/dL (ref 30.0–36.0)
MCV: 72.6 fL — ABNORMAL LOW (ref 80.0–100.0)
Monocytes Absolute: 0.3 10*3/uL (ref 0.1–1.0)
Monocytes Relative: 6 %
Neutro Abs: 3 10*3/uL (ref 1.7–7.7)
Neutrophils Relative %: 66 %
Platelets: 110 10*3/uL — ABNORMAL LOW (ref 150–400)
RBC: 4.2 MIL/uL (ref 3.87–5.11)
RDW: 13 % (ref 11.5–15.5)
WBC: 4.6 10*3/uL (ref 4.0–10.5)
nRBC: 0 % (ref 0.0–0.2)

## 2022-05-24 LAB — COMPREHENSIVE METABOLIC PANEL
ALT: 13 U/L (ref 0–44)
AST: 55 U/L — ABNORMAL HIGH (ref 15–41)
Albumin: 3.2 g/dL — ABNORMAL LOW (ref 3.5–5.0)
Alkaline Phosphatase: 49 U/L (ref 38–126)
Anion gap: 10 (ref 5–15)
BUN: 19 mg/dL (ref 8–23)
CO2: 25 mmol/L (ref 22–32)
Calcium: 7.8 mg/dL — ABNORMAL LOW (ref 8.9–10.3)
Chloride: 98 mmol/L (ref 98–111)
Creatinine, Ser: 1.76 mg/dL — ABNORMAL HIGH (ref 0.44–1.00)
GFR, Estimated: 31 mL/min — ABNORMAL LOW (ref >60)
Glucose, Bld: 109 mg/dL — ABNORMAL HIGH (ref 70–99)
Potassium: 3.2 mmol/L — ABNORMAL LOW (ref 3.5–5.1)
Sodium: 133 mmol/L — ABNORMAL LOW (ref 135–145)
Total Bilirubin: 1 mg/dL (ref 0.3–1.2)
Total Protein: 6.4 g/dL — ABNORMAL LOW (ref 6.5–8.1)

## 2022-05-24 LAB — T4, FREE: Free T4: 0.42 ng/dL — ABNORMAL LOW (ref 0.61–1.12)

## 2022-05-24 LAB — BASIC METABOLIC PANEL
Anion gap: 9 (ref 5–15)
BUN: 18 mg/dL (ref 8–23)
CO2: 23 mmol/L (ref 22–32)
Calcium: 8 mg/dL — ABNORMAL LOW (ref 8.9–10.3)
Chloride: 101 mmol/L (ref 98–111)
Creatinine, Ser: 1.86 mg/dL — ABNORMAL HIGH (ref 0.44–1.00)
GFR, Estimated: 29 mL/min — ABNORMAL LOW (ref >60)
Glucose, Bld: 111 mg/dL — ABNORMAL HIGH (ref 70–99)
Potassium: 3.5 mmol/L (ref 3.5–5.1)
Sodium: 133 mmol/L — ABNORMAL LOW (ref 135–145)

## 2022-05-24 LAB — GLUCOSE, CAPILLARY
Glucose-Capillary: 101 mg/dL — ABNORMAL HIGH (ref 70–99)
Glucose-Capillary: 104 mg/dL — ABNORMAL HIGH (ref 70–99)
Glucose-Capillary: 107 mg/dL — ABNORMAL HIGH (ref 70–99)
Glucose-Capillary: 117 mg/dL — ABNORMAL HIGH (ref 70–99)
Glucose-Capillary: 120 mg/dL — ABNORMAL HIGH (ref 70–99)
Glucose-Capillary: 125 mg/dL — ABNORMAL HIGH (ref 70–99)
Glucose-Capillary: 97 mg/dL (ref 70–99)

## 2022-05-24 LAB — LACTATE DEHYDROGENASE: LDH: 172 U/L (ref 98–192)

## 2022-05-24 LAB — CORTISOL: Cortisol, Plasma: 6.3 ug/dL

## 2022-05-24 LAB — HIV ANTIBODY (ROUTINE TESTING W REFLEX): HIV Screen 4th Generation wRfx: NONREACTIVE

## 2022-05-24 LAB — CBG MONITORING, ED: Glucose-Capillary: 104 mg/dL — ABNORMAL HIGH (ref 70–99)

## 2022-05-24 LAB — TSH: TSH: 1.617 u[IU]/mL (ref 0.350–4.500)

## 2022-05-24 MED ORDER — HEPARIN SODIUM (PORCINE) 5000 UNIT/ML IJ SOLN
5000.0000 [IU] | Freq: Three times a day (TID) | INTRAMUSCULAR | Status: DC
  Administered 2022-05-24 – 2022-05-26 (×7): 5000 [IU] via SUBCUTANEOUS
  Filled 2022-05-24 (×6): qty 1

## 2022-05-24 MED ORDER — ENSURE ENLIVE PO LIQD: 237.0000 mL | Freq: Two times a day (BID) | ORAL | Status: DC

## 2022-05-24 MED ORDER — ONDANSETRON HCL 4 MG PO TABS: 4.0000 mg | ORAL_TABLET | Freq: Four times a day (QID) | ORAL | Status: DC | PRN

## 2022-05-24 MED ORDER — ACETAMINOPHEN 325 MG PO TABS: 650.0000 mg | ORAL_TABLET | Freq: Four times a day (QID) | ORAL | Status: DC | PRN

## 2022-05-24 MED ORDER — ONDANSETRON HCL 4 MG/2ML IJ SOLN: 4.0000 mg | Freq: Four times a day (QID) | INTRAMUSCULAR | Status: DC | PRN

## 2022-05-24 MED ORDER — MEGESTROL ACETATE 400 MG/10ML PO SUSP
400.0000 mg | Freq: Two times a day (BID) | ORAL | Status: DC
  Administered 2022-05-24 – 2022-05-26 (×5): 400 mg via ORAL
  Filled 2022-05-24 (×6): qty 10

## 2022-05-24 MED ORDER — ACETAMINOPHEN 650 MG RE SUPP: 650.0000 mg | Freq: Four times a day (QID) | RECTAL | Status: DC | PRN

## 2022-05-24 MED ORDER — POTASSIUM CHLORIDE CRYS ER 20 MEQ PO TBCR
40.0000 meq | EXTENDED_RELEASE_TABLET | ORAL | Status: AC
  Administered 2022-05-24 (×2): 40 meq via ORAL
  Filled 2022-05-24 (×2): qty 2

## 2022-05-24 NOTE — Progress Notes (Addendum)
[facility] hospitalist Short progress note   Reviewed the H&P and examined the patient. The patient states that her oral intake has been extremely poor.  We discussed her CT scan findings that reveal numerous small metastasis throughout her spine.  She states that she has not had any spinal pain.  She admits that she has not had a mammogram in many years.  Her last colonoscopy was in 2017 and then I have reviewed this. She has no complaints of blood in the urine. On exam she appears depressed with a flat affect.  Loss of appetite, dehydration and hypoglycemia We have discussed starting Megace for her appetite.  We need to continue IV fluids until I see her appetite improve.  Hypokalemia - replacing- recheck in AM  Metastatic disease Oncology recommends an outpatient PET scan as none of the imaging reveals an area that could be biopsied.  Debbe Odea, MD

## 2022-05-24 NOTE — Progress Notes (Signed)
Transition of Care [facility]) Screening Note  Patient Details  Name: Rilea Arutyunyan Date of Birth: 06-28-1956  Transition of Care [facility]) CM/SW Contact:    Sherie Don, LCSW Phone Number: 05/24/2022, 9:52 AM  Transition of Care Department [facility]) has reviewed patient and no TOC needs have been identified at this time. We will continue to monitor patient advancement through interdisciplinary progression rounds. If new patient transition needs arise, please place a TOC consult.

## 2022-05-24 NOTE — Assessment & Plan Note (Signed)
Acute. Pt denies taking any diuretics.  We will continue with normal saline.  Check TSH.  Check a.m. cortisol level.  Repeat CMP in the morning.

## 2022-05-24 NOTE — Consult Note (Addendum)
[facility]  [phone] [phone]   MEDICAL ONCOLOGY - INITIAL CONSULTATION  Referral MD: Dr. Debbe Odea  Reason for Referral: Sclerotic bone lesions  HPI: Ms. Rothlisberger is a 66 year old female with a past medical history significant for hypertension.  She presented to the emergency department with diarrhea, anorexia, weakness.  She reportedly had a 4-day history of the symptoms and staying in bed all day.  Admission lab work showed a normal WBC and hemoglobin but platelets were low at 126,000.  Her sodium was 130, glucose 41, creatinine 1.3 (baseline 1.0-1.1), total protein 8.6, AST 68, T. bili 1.3.  Hemoglobin has dropped to 9.8 this morning after hydration.  CT abdomen/pelvis without contrast showed multiple sclerotic foci throughout the marrow space highly spacious for metastatic disease.  CT head without contrast was negative for acute intracranial abnormality.  CT chest without contrast showed numerous sclerotic osseous lesions worrisome for metastatic disease.  The patient is laying in bed resting quietly at the time my visit.  No family at the bedside.  The patient reports that she has been quite weak for at least a few days.  Her appetite has been poor.  She does not think that she has lost any weight however.  She denies fevers, chills, night sweats, headaches, dizziness.  Denies chest pain, shortness of breath, abdominal pain.  She reported having an episode of vomiting this morning after trying to take a pill.  Reports some constipation but no diarrhea.  Has not noticed any melena or hematochezia.  Denies any other bleeding.  Denies bone pain.  Denies lower extremity edema.  The patient is single and lives with her son.  She has 1 child.  Denies history of alcohol and tobacco use.  She works at the [employer].  Denies family history of malignancy.  Medical oncology was asked see the patient make recommendations regarding her abnormal imaging  findings.   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   Hyperlipidemia    Hypertension   :   Past Surgical History:  Procedure Laterality Date   CHOLECYSTECTOMY N/A 05/21/2016   Procedure: LAPAROSCOPIC CHOLECYSTECTOMY;  Surgeon: Erroll Luna, MD;  Location: [facility] [place];  Service: General;  Laterality: N/A;   COLONOSCOPY     GIVENS CAPSULE STUDY N/A 07/22/2016   Procedure: GIVENS CAPSULE STUDY;  Surgeon: Carol Ada, MD;  Location: [place];  Service: Endoscopy;  Laterality: N/A;  :   Current Facility-Administered Medications  Medication Dose Route Frequency Provider Last Rate Last Admin   acetaminophen (TYLENOL) tablet 650 mg  650 mg Oral Q6H PRN Kristopher Oppenheim, DO       Or   acetaminophen (TYLENOL) suppository 650 mg  650 mg Rectal Q6H PRN Kristopher Oppenheim, DO       dextrose 5 %-0.9 % sodium chloride infusion   Intravenous Continuous Kristopher Oppenheim, DO 100 mL/hr at 05/24/22 0125 New Bag at 05/24/22 0125   feeding supplement (ENSURE ENLIVE / ENSURE PLUS) liquid 237 mL  237 mL Oral BID BM Rizwan, Eunice Blase, MD       heparin injection 5,000 Units  5,000 Units Subcutaneous Q8H Kristopher Oppenheim, DO   5,000 Units at 05/24/22 0534   megestrol (MEGACE) 400 MG/10ML suspension 400 mg  400 mg Oral BID Debbe Odea, MD       ondansetron (ZOFRAN) tablet 4 mg  4 mg Oral Q6H  PRN Kristopher Oppenheim, DO       Or   ondansetron [facility]) injection 4 mg  4 mg Intravenous Q6H PRN Kristopher Oppenheim, DO       potassium chloride SA (KLOR-CON M) CR tablet 40 mEq  40 mEq Oral Q4H Rizwan, Eunice Blase, MD          Allergies  Allergen Reactions   Isovue [Iopamidol] Hives    Pt broke out in several facial hives, one on her back.  She will need full premeds in the future.  J Bohm No allergy demonstrated to  Orally ingested Iodinated agent. (Gastrographin CT Scan 05-12-16) Pt broke out in several facial hives, one on her back.  She will  need full premeds in the future.  J Bohm   Iodine-131 Rash  :   Family History  Problem Relation Age of Onset   Stroke Mother    Hyperlipidemia Mother    Diabetes Mother   :   Social History   Socioeconomic History   Marital status: Divorced    Spouse name: Not on file   Number of children: Not on file   Years of education: Not on file   Highest education level: Not on file  Occupational History   Not on file  Tobacco Use   Smoking status: Never   Smokeless tobacco: Never  Substance and Sexual Activity   Alcohol use: No    Alcohol/week: 0.0 standard drinks of alcohol   Drug use: No   Sexual activity: Never  Other Topics Concern   Not on file  Social History Narrative   Not on file   Social Determinants of Health   Financial Resource Strain: Not on file  Food Insecurity: Not on file  Transportation Needs: Not on file  Physical Activity: Not on file  Stress: Not on file  Social Connections: Not on file  Intimate Partner Violence: Not on file  :  Review of Systems: A comprehensive 14 point review of systems was negative except as noted in the HPI.  Exam: Patient Vitals for the past 24 hrs:  BP Temp Temp src Pulse Resp SpO2 Height Weight  05/24/22 0533 97/63 98.4 F (36.9 C) Oral 74 14 99 % -- --  05/24/22 0125 113/77 98.3 F (36.8 C) Oral 80 14 100 % $Rem'5\' 4"'LlzI$  (1.626 m) 49.1 kg  05/24/22 0045 98/73 -- -- 80 17 100 % -- --  05/24/22 0010 (!) 81/55 -- -- -- 15 -- -- --  05/24/22 0000 (!) 84/72 -- -- -- 18 -- -- --  05/23/22 2330 100/79 -- -- 74 (!) 23 100 % -- --  05/23/22 2307 102/72 -- -- 77 18 98 % -- --  05/23/22 2209 106/76 -- -- 83 20 97 % -- --  05/23/22 2030 116/85 -- -- 84 (!) 24 100 % -- --  05/23/22 1925 117/87 -- -- (!) 42 19 98 % -- --  05/23/22 1840 110/78 -- -- 83 (!) 21 98 % -- --  05/23/22 1800 120/85 -- -- 86 20 100 % -- --  05/23/22 1700 (!) 117/92 -- -- 81 (!) 23 100 % -- --  05/23/22 1630 (!) 115/100 -- -- 69 15 100 % -- --  05/23/22  1511 97/83 (!) 97.4 F (36.3 C) Oral 95 20 98 % -- --    General: Laying in bed, no distress Eyes:  no scleral icterus.   ENT:  There were no oropharyngeal lesions.  .   Lymphatics:  Negative cervical,  supraclavicular, axillary, inguinal adenopathy. Respiratory: lungs were clear bilaterally without wheezing or crackles.   Breasts: No palpable masses. Cardiovascular:  Regular rate and rhythm, S1/S2, without murmur, rub or gallop.  There was no pedal edema.   GI: Positive bowel sounds, soft, nontender.   Skin exam was without echymosis, petichae.   Neuro exam was nonfocal. Patient was alert and oriented.    Lab Results  Component Value Date   WBC 4.6 05/24/2022   HGB 9.8 (L) 05/24/2022   HCT 30.5 (L) 05/24/2022   PLT 110 (L) 05/24/2022   GLUCOSE 109 (H) 05/24/2022   CHOL 256 (H) 02/17/2018   TRIG 99 02/17/2018   HDL 61 02/17/2018   LDLCALC 173 (H) 02/17/2018   ALT 13 05/24/2022   AST 55 (H) 05/24/2022   NA 133 (L) 05/24/2022   K 3.2 (L) 05/24/2022   CL 98 05/24/2022   CREATININE 1.76 (H) 05/24/2022   BUN 19 05/24/2022   CO2 25 05/24/2022    CT Chest Wo Contrast  Result Date: 05/23/2022 CLINICAL DATA:  Pneumonia. EXAM: CT CHEST WITHOUT CONTRAST TECHNIQUE: Multidetector CT imaging of the chest was performed following the standard protocol without IV contrast. RADIATION DOSE REDUCTION: This exam was performed according to the departmental dose-optimization program which includes automated exposure control, adjustment of the mA and/or kV according to patient size and/or use of iterative reconstruction technique. COMPARISON:  Chest x-ray same day FINDINGS: Cardiovascular: No significant vascular findings. Normal heart size. No pericardial effusion. There are atherosclerotic calcifications of the aorta and coronary arteries. Mediastinum/Nodes: No enlarged mediastinal or axillary lymph nodes. There are few tiny foci of air inferior to the carina, indeterminate. There is likely a tracheal  or esophageal diverticulum on the right at the level of the thoracic inlet measuring 8 x 13 by 11 mm. Thyroid gland, trachea, and esophagus demonstrate no significant findings. Lungs/Pleura: There are minimal patchy ground-glass opacities in the right lower lobe/lung base. The lungs are otherwise clear. There is no pleural effusion or pneumothorax. Upper Abdomen: Cholecystectomy clips are present. Musculoskeletal: There are innumerable sclerotic osseous lesions seen throughout the spine measuring less than 1 cm. No acute fractures are seen. IMPRESSION: 1. Minimal patchy ground-glass opacities in the right lower lobe, likely infectious/inflammatory. 2. Small amount of air inferior to the carina may represent pneumomediastinum versus small tracheal diverticula. 3. 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 4. Numerous sclerotic osseous lesions worrisome for metastatic disease. Electronically Signed   By: Ronney Asters M.D.   On: 05/23/2022 21:22   CT Head Wo Contrast  Result Date: 05/23/2022 CLINICAL DATA:  Mental status change, unknown cause EXAM: CT HEAD WITHOUT CONTRAST TECHNIQUE: Contiguous axial images were obtained from the base of the skull through the vertex without intravenous contrast. RADIATION DOSE REDUCTION: This exam was performed according to the departmental dose-optimization program which includes automated exposure control, adjustment of the mA and/or kV according to patient size and/or use of iterative reconstruction technique. COMPARISON:  None Available. FINDINGS: Brain: No evidence of acute intracranial hemorrhage or extra-axial collection.No evidence of mass lesion/concerning mass effect.The ventricles are normal in size. Vascular: No hyperdense vessel or unexpected calcification. Skull: Normal. Negative for fracture or focal lesion. Sinuses/Orbits: Mild mucosal thickening of the maxillary sinuses. Other: None. IMPRESSION: No acute intracranial  abnormality. Electronically Signed   By: Maurine Simmering M.D.   On: 05/23/2022 18:47   CT Abdomen Pelvis Wo Contrast  Result Date: 05/23/2022 CLINICAL DATA:  Nonlocalized abdominal  pain. EXAM: CT ABDOMEN AND PELVIS WITHOUT CONTRAST TECHNIQUE: Multidetector CT imaging of the abdomen and pelvis was performed following the standard protocol without IV contrast. RADIATION DOSE REDUCTION: This exam was performed according to the departmental dose-optimization program which includes automated exposure control, adjustment of the mA and/or kV according to patient size and/or use of iterative reconstruction technique. COMPARISON:  05/12/2016 FINDINGS: Lower chest: Clear lung bases. Normal heart size without pericardial or pleural effusion. Right coronary artery calcification. Hepatobiliary: Normal noncontrast appearance of the liver. Cholecystectomy, without biliary ductal dilatation. Pancreas: Normal, without mass or ductal dilatation. Spleen: New subtle splenic volume loss and hypoattenuation, including on 11/02. No perisplenic fluid. Adrenals/Urinary Tract: Normal adrenal glands. No renal calculi or hydronephrosis. No hydroureter or ureteric calculi. No bladder calculi. Stomach/Bowel: Normal stomach, without wall thickening. Normal colon and terminal ileum. Appendix not visualized. Normal small bowel. Vascular/Lymphatic: Aortic atherosclerosis. No abdominopelvic adenopathy. Reproductive: Normal uterus and adnexa. Other: No free intraperitoneal air. Small volume pelvic fluid including on 54/2 is nonspecific. Musculoskeletal: Development of innumerable tiny sclerotic lesions throughout the pelvis and thoracolumbar spine. Trace L4-5 anterolisthesis. IMPRESSION: 1. Development of multiple sclerotic foci throughout the marrow space, highly suspicious for metastatic disease. Correlate with primary malignancy history and consider further evaluation with PET. 2. Given limitations of lack of oral or IV contrast, no acute process in  the abdomen or pelvis. 3. Small volume free pelvic fluid, abnormal for age but nonspecific. 4. Hypoattenuation and volume loss within the spleen, new since 2017 and possibly related to small volume infarct. No perisplenic fluid to confirm acute or subacute time course. 5. Coronary artery atherosclerosis. Aortic Atherosclerosis (ICD10-I70.0). Electronically Signed   By: Abigail Miyamoto M.D.   On: 05/23/2022 18:11   DG Chest Port 1 View  Result Date: 05/23/2022 CLINICAL DATA:  AMS EXAM: PORTABLE CHEST 1 VIEW COMPARISON:  None Available. FINDINGS: The heart size and mediastinal contours are within normal limits. Both lungs are clear. No focal consolidation, pleural effusion or vascular congestion. The visualized skeletal structures are unremarkable. IMPRESSION: No active disease. Electronically Signed   By: Frazier Richards M.D.   On: 05/23/2022 18:02     CT Chest Wo Contrast  Result Date: 05/23/2022 CLINICAL DATA:  Pneumonia. EXAM: CT CHEST WITHOUT CONTRAST TECHNIQUE: Multidetector CT imaging of the chest was performed following the standard protocol without IV contrast. RADIATION DOSE REDUCTION: This exam was performed according to the departmental dose-optimization program which includes automated exposure control, adjustment of the mA and/or kV according to patient size and/or use of iterative reconstruction technique. COMPARISON:  Chest x-ray same day FINDINGS: Cardiovascular: No significant vascular findings. Normal heart size. No pericardial effusion. There are atherosclerotic calcifications of the aorta and coronary arteries. Mediastinum/Nodes: No enlarged mediastinal or axillary lymph nodes. There are few tiny foci of air inferior to the carina, indeterminate. There is likely a tracheal or esophageal diverticulum on the right at the level of the thoracic inlet measuring 8 x 13 by 11 mm. Thyroid gland, trachea, and esophagus demonstrate no significant findings. Lungs/Pleura: There are minimal patchy  ground-glass opacities in the right lower lobe/lung base. The lungs are otherwise clear. There is no pleural effusion or pneumothorax. Upper Abdomen: Cholecystectomy clips are present. Musculoskeletal: There are innumerable sclerotic osseous lesions seen throughout the spine measuring less than 1 cm. No acute fractures are seen. IMPRESSION: 1. Minimal patchy ground-glass opacities in the right lower lobe, likely infectious/inflammatory. 2. Small amount of air inferior to the carina may represent pneumomediastinum versus small  tracheal diverticula. 3. 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 4. Numerous sclerotic osseous lesions worrisome for metastatic disease. Electronically Signed   By: Ronney Asters M.D.   On: 05/23/2022 21:22   CT Head Wo Contrast  Result Date: 05/23/2022 CLINICAL DATA:  Mental status change, unknown cause EXAM: CT HEAD WITHOUT CONTRAST TECHNIQUE: Contiguous axial images were obtained from the base of the skull through the vertex without intravenous contrast. RADIATION DOSE REDUCTION: This exam was performed according to the departmental dose-optimization program which includes automated exposure control, adjustment of the mA and/or kV according to patient size and/or use of iterative reconstruction technique. COMPARISON:  None Available. FINDINGS: Brain: No evidence of acute intracranial hemorrhage or extra-axial collection.No evidence of mass lesion/concerning mass effect.The ventricles are normal in size. Vascular: No hyperdense vessel or unexpected calcification. Skull: Normal. Negative for fracture or focal lesion. Sinuses/Orbits: Mild mucosal thickening of the maxillary sinuses. Other: None. IMPRESSION: No acute intracranial abnormality. Electronically Signed   By: Maurine Simmering M.D.   On: 05/23/2022 18:47   CT Abdomen Pelvis Wo Contrast  Result Date: 05/23/2022 CLINICAL DATA:  Nonlocalized abdominal pain. EXAM: CT ABDOMEN AND PELVIS  WITHOUT CONTRAST TECHNIQUE: Multidetector CT imaging of the abdomen and pelvis was performed following the standard protocol without IV contrast. RADIATION DOSE REDUCTION: This exam was performed according to the departmental dose-optimization program which includes automated exposure control, adjustment of the mA and/or kV according to patient size and/or use of iterative reconstruction technique. COMPARISON:  05/12/2016 FINDINGS: Lower chest: Clear lung bases. Normal heart size without pericardial or pleural effusion. Right coronary artery calcification. Hepatobiliary: Normal noncontrast appearance of the liver. Cholecystectomy, without biliary ductal dilatation. Pancreas: Normal, without mass or ductal dilatation. Spleen: New subtle splenic volume loss and hypoattenuation, including on 11/02. No perisplenic fluid. Adrenals/Urinary Tract: Normal adrenal glands. No renal calculi or hydronephrosis. No hydroureter or ureteric calculi. No bladder calculi. Stomach/Bowel: Normal stomach, without wall thickening. Normal colon and terminal ileum. Appendix not visualized. Normal small bowel. Vascular/Lymphatic: Aortic atherosclerosis. No abdominopelvic adenopathy. Reproductive: Normal uterus and adnexa. Other: No free intraperitoneal air. Small volume pelvic fluid including on 54/2 is nonspecific. Musculoskeletal: Development of innumerable tiny sclerotic lesions throughout the pelvis and thoracolumbar spine. Trace L4-5 anterolisthesis. IMPRESSION: 1. Development of multiple sclerotic foci throughout the marrow space, highly suspicious for metastatic disease. Correlate with primary malignancy history and consider further evaluation with PET. 2. Given limitations of lack of oral or IV contrast, no acute process in the abdomen or pelvis. 3. Small volume free pelvic fluid, abnormal for age but nonspecific. 4. Hypoattenuation and volume loss within the spleen, new since 2017 and possibly related to small volume infarct. No  perisplenic fluid to confirm acute or subacute time course. 5. Coronary artery atherosclerosis. Aortic Atherosclerosis (ICD10-I70.0). Electronically Signed   By: Abigail Miyamoto M.D.   On: 05/23/2022 18:11   DG Chest Port 1 View  Result Date: 05/23/2022 CLINICAL DATA:  AMS EXAM: PORTABLE CHEST 1 VIEW COMPARISON:  None Available. FINDINGS: The heart size and mediastinal contours are within normal limits. Both lungs are clear. No focal consolidation, pleural effusion or vascular congestion. The visualized skeletal structures are unremarkable. IMPRESSION: No active disease. Electronically Signed   By: Frazier Richards M.D.   On: 05/23/2022 18:02    Assessment and Plan:  This is a 66 year old female with  Sclerotic bone lesions.  CT imaging results were reviewed and discussed with the patient.  She has  numerous sclerotic bone lesions throughout the spine and pelvis of unclear etiology.  No obvious primary mass noted on CT.  These are all very small and probably not amenable to biopsy.  SPEP has been sent and we will follow-up on the results.  Would recommend proceeding with a PET scan as an outpatient to determine best site to biopsy.  Microcytic anemia, alpha thalassemia.  Hemoglobin has dropped since admission is likely dilutional.  Baseline hemoglobin in the 9-10 range.  Monitor.  Thrombocytopenia.  Has developed new thrombocytopenia compared to prior labs from 2019.  Unclear if related to bone lesions/malignant process.  Monitor.  AKI/CKD.  Has had some mild renal insufficiency dating back to at least 2019.  Renal function worse this admission.  Continue hydration.  Monitor renal function closely.  Thank you for this referral.   Mikey Bussing, DNP, AGPCNP-BC, AOCNP   Patient seen and examined personally.  66 year old woman admitted for weakness and vague symptoms with incidental findings of sclerotic Bone lesions on CT scan.  She had denies any bone pain or falls.  She denies any specific  symptoms.  On exam she is awake alert comfortable without any distress.  Vitals within normal range.  No oral ulcers or lesions heart was regular rate abdomen soft without any rebound or guarding.  Laboratory data and CT scan were personally reviewed.  Assessment and plan: 66 year old woman with incidental finding of nonspecific sclerotic lesions of the spine.  The differential diagnosis including metastatic malignancy, multiple myeloma versus benign findings and false positive results.  Serum protein electrophoresis and myeloma work-up is currently pending.  CT scan chest abdomen and pelvis did not show any clear-cut primary.  The ideal plan is to obtain tissue biopsy but likely would be low yield based on the limited sclerotic lesions.  At this time, I favor obtaining an outpatient PET scan and obtaining tissue biopsy after that.  We will arrange outpatient follow-up upon discharge to do so.

## 2022-05-24 NOTE — Assessment & Plan Note (Signed)
Patient denies having any history of diabetes.  Is not taking any insulin or sulfonylureas.  Check a C-peptide level.  Continue with D5 normal saline.

## 2022-05-24 NOTE — ED Notes (Signed)
Hospitalist at [facility] long notified of BP. Pt has had no new symptoms of dizziness, pain, SOB, nausea. [facility] here for transport

## 2022-05-24 NOTE — Assessment & Plan Note (Signed)
Chronic. 

## 2022-05-24 NOTE — Assessment & Plan Note (Addendum)
Admit to observation bed. I do not think pt has pneumonia. Will not continue abx. Pt does not have any respiratory symptoms. Discussed with pt that she has multiple bony lesions. Total protein is elevated. Could be from dehydration. Will check for m-spike, LDH. May need bony biopsy. Consider colonoscopy.  Metastatic disease ([place]) is a Acute illness/condition that poses a threat to life or bodily function.

## 2022-05-24 NOTE — Progress Notes (Signed)
She has seen Dr. Marin Olp in the past and please arrange follow-up with him upon discharge to follow-up on these abnormal sclerotic bone lesions.

## 2022-05-24 NOTE — H&P (Addendum)
History and Physical    [name] EXH:[id number] DOB: [birth year] DOA: 05/23/2022  DOS: the patient was seen and examined on 05/23/2022  PCP: Silverio Decamp, MD   Patient coming from: Home  I have personally briefly reviewed patient's old medical records in [facility]  Chief complaint is diarrhea, poor appetite, weakness.  History of present illness.  66 year old African-American female with reported history of hypertension, presents to the ER today with a reported 4-day history of feeling weak and being in bed.  Family is not at bedside.  Patient is a poor historian.  She is unable to give any history review of systems.  She states that she is not taking any medications at all.  She is able to answer questions in yes and no fashion.  She does not give any detailed explanations.  She admits to being weak for the last 4 days.  She admits that she has been able to get up and use the bathroom.  She has not been eating or drinking.  She states that she lives with her son.  In the ER, serum glucose of 41.  Sodium 130, BUN of 17, creatinine 1.3  Total protein elevated 8.6, albumin 4.3 AST of 68, ALT of 13 alk phos of 66 total bili 1.3  Urinalysis shows specific gravity 1.018, ketones 40, protein 30, negative nitrates, negative leukocyte esterase  White count 7.9, hemoglobin 12.4, platelets 126  CT head was negative for any acute intracranial abnormality.  Chest CT which I have personally reviewed and interpreted was negative for any pneumonia.  Radiology interpretation states that there is minimal patchy groundglass opacities right lower lobe.  I disagree.  I do not see any infectious etiology in her right lower lobe.  I do concur with the multiple sclerotic osseous lesions in her vertebral spine.  [facility] hospitalist contacted for admission.   ED Course: Serum glucose of 41.  Patient started on dextrose IV fluids.  CT scan with multiple metastatic osseous  lesions.  Review of Systems:  Review of Systems  Unable to perform ROS: Other  Patient with poor historical recall.  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 She has never used smokeless tobacco. She reports that she does not drink alcohol and does not use drugs.  Allergies  Allergen Reactions   Isovue [Iopamidol] Hives    Pt broke out in several facial hives, one on her back.  She will need full premeds in the future.  J Bohm No allergy demonstrated to  Orally ingested Iodinated agent. (Gastrographin CT Scan 05-12-16) Pt broke out in several facial hives, one on her back.  She will need full premeds in the future.  J Bohm   Iodine-131 Rash    Family History  Problem Relation Age of Onset   Stroke Mother    Hyperlipidemia Mother    Diabetes Mother  Prior to Admission medications   Medication Sig Start Date End Date Taking? Authorizing Provider  amLODipine (NORVASC) 10 MG tablet Take 1 tablet (10 mg total) by mouth daily. 01/13/17   Silverio Decamp, MD  atorvastatin (LIPITOR) 40 MG tablet Take 1 tablet (40 mg total) by mouth daily. 02/18/18   Silverio Decamp, MD  Calcium Carbonate-Vitamin D 600-400 MG-UNIT tablet Take 1 tablet by mouth 2 (two) times daily. 03/12/18   Trixie Dredge, PA-C  folic acid (FOLVITE) 1 MG tablet Take 2 tablets (2 mg total) by mouth daily. 09/16/18    Celso Amy, NP  lisinopril (PRINIVIL,ZESTRIL) 20 MG tablet Take 1 tablet (20 mg total) by mouth daily. 02/17/18   Silverio Decamp, MD  Magnesium 100 MG CAPS Take 1 tablet by mouth daily after breakfast.    [provider]    Physical Exam: Vitals:   05/24/22 0000 05/24/22 0010 05/24/22 0045 05/24/22 0125  BP: (!) 84/72 (!) 81/55 98/73 113/77  Pulse:   80 80  Resp: $Remo'18 15 17 14  'Kmopt$ Temp:    98.3 F (36.8 C)  TempSrc:    Oral  SpO2:   100% 100%  Weight:    49.1 kg    Physical Exam Vitals and nursing note reviewed.  Constitutional:      General: She is not in acute distress.    Appearance: She is not ill-appearing, toxic-appearing or diaphoretic.  HENT:     Head: Normocephalic and atraumatic.     Nose: Nose normal.  Eyes:     General: No scleral icterus. Cardiovascular:     Rate and Rhythm: Normal rate and regular rhythm.     Pulses: Normal pulses.  Pulmonary:     Effort: Pulmonary effort is normal. No respiratory distress.     Breath sounds: Normal breath sounds. No wheezing or rales.  Abdominal:     General: Abdomen is flat. Bowel sounds are normal. There is no distension.     Palpations: Abdomen is soft.     Tenderness: There is no abdominal tenderness. There is no guarding.  Musculoskeletal:     Right lower leg: No edema.  Skin:    General: Skin is warm and dry.     Capillary Refill: Capillary refill takes less than 2 seconds.  Neurological:     General: No focal deficit present.     Mental Status: She is alert and oriented to person, place, and time.      Labs on Admission: I have personally reviewed following labs and imaging studies  CBC: Recent Labs  Lab 05/23/22 1714  WBC 7.9  NEUTROABS 6.4  HGB 12.4  HCT 39.2  MCV 72.3*  PLT 264*   Basic Metabolic Panel: Recent Labs  Lab 05/23/22 1714  NA 130*  K 4.5  CL 87*  CO2 24  GLUCOSE 41*  BUN 17  CREATININE 1.30*  CALCIUM 9.7  MG 1.7   GFR: CrCl cannot be calculated (Unknown  ideal weight.). Liver Function Tests: Recent Labs  Lab 05/23/22 1714  AST 68*  ALT 13  ALKPHOS 66  BILITOT 1.3*  PROT 8.6*  ALBUMIN 4.3   Recent Labs  Lab 05/23/22 1714  LIPASE <10*   No results for input(s): "AMMONIA" in the last 168 hours. Coagulation Profile: No results for input(s): "INR", "PROTIME" in the last 168 hours. Cardiac Enzymes: No results for input(s): "CKTOTAL", "CKMB", "CKMBINDEX", "TROPONINI", "TROPONINIHS" in the last 168 hours. BNP (last 3 results) No results for  input(s): "PROBNP" in the last 8760 hours. HbA1C: No results for input(s): "HGBA1C" in the last 72 hours. CBG: Recent Labs  Lab 05/23/22 1841 05/23/22 2247 05/24/22 0030  GLUCAP 199* 94 104*   Lipid Profile: No results for input(s): "CHOL", "HDL", "LDLCALC", "TRIG", "CHOLHDL", "LDLDIRECT" in the last 72 hours. Thyroid Function Tests: No results for input(s): "TSH", "T4TOTAL", "FREET4", "T3FREE", "THYROIDAB" in the last 72 hours. Anemia Panel: No results for input(s): "VITAMINB12", "FOLATE", "FERRITIN", "TIBC", "IRON", "RETICCTPCT" in the last 72 hours. Urine analysis:    Component Value Date/Time   COLORURINE YELLOW 05/23/2022 1714   APPEARANCEUR CLEAR 05/23/2022 1714   LABSPEC 1.018 05/23/2022 1714   PHURINE 5.5 05/23/2022 1714   GLUCOSEU NEGATIVE 05/23/2022 1714   HGBUR NEGATIVE 05/23/2022 1714   BILIRUBINUR NEGATIVE 05/23/2022 1714   KETONESUR 40 (A) 05/23/2022 1714   PROTEINUR 30 (A) 05/23/2022 1714   NITRITE NEGATIVE 05/23/2022 1714   LEUKOCYTESUR NEGATIVE 05/23/2022 1714    Radiological Exams on Admission: I have personally reviewed images CT Chest Wo Contrast  Result Date: 05/23/2022 CLINICAL DATA:  Pneumonia. EXAM: CT CHEST WITHOUT CONTRAST TECHNIQUE: Multidetector CT imaging of the chest was performed following the standard protocol without IV contrast. RADIATION DOSE REDUCTION: This exam was performed according to the departmental dose-optimization program which includes  automated exposure control, adjustment of the mA and/or kV according to patient size and/or use of iterative reconstruction technique. COMPARISON:  Chest x-ray same day FINDINGS: Cardiovascular: No significant vascular findings. Normal heart size. No pericardial effusion. There are atherosclerotic calcifications of the aorta and coronary arteries. Mediastinum/Nodes: No enlarged mediastinal or axillary lymph nodes. There are few tiny foci of air inferior to the carina, indeterminate. There is likely a tracheal or esophageal diverticulum on the right at the level of the thoracic inlet measuring 8 x 13 by 11 mm. Thyroid gland, trachea, and esophagus demonstrate no significant findings. Lungs/Pleura: There are minimal patchy ground-glass opacities in the right lower lobe/lung base. The lungs are otherwise clear. There is no pleural effusion or pneumothorax. Upper Abdomen: Cholecystectomy clips are present. Musculoskeletal: There are innumerable sclerotic osseous lesions seen throughout the spine measuring less than 1 cm. No acute fractures are seen. IMPRESSION: 1. Minimal patchy ground-glass opacities in the right lower lobe, likely infectious/inflammatory. 2. Small amount of air inferior to the carina may represent pneumomediastinum versus small tracheal diverticula. 3. 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 4. Numerous sclerotic osseous lesions worrisome for metastatic disease. Electronically Signed   By: Ronney Asters M.D.   On: 05/23/2022 21:22   CT Head Wo Contrast  Result Date: 05/23/2022 CLINICAL DATA:  Mental status change, unknown cause EXAM: CT HEAD WITHOUT CONTRAST TECHNIQUE: Contiguous axial images were obtained from the base of the skull through the vertex without intravenous contrast. RADIATION DOSE REDUCTION: This exam was performed according to the departmental dose-optimization program which includes automated exposure control, adjustment of the mA  and/or kV according to patient size and/or use of iterative reconstruction technique. COMPARISON:  None Available. FINDINGS: Brain: No evidence of acute intracranial hemorrhage or extra-axial collection.No evidence of mass lesion/concerning mass effect.The ventricles are normal in size. Vascular: No hyperdense vessel or unexpected calcification. Skull: Normal. Negative for fracture or focal lesion. Sinuses/Orbits: Mild mucosal thickening of the maxillary sinuses. Other: None. IMPRESSION: No acute intracranial abnormality. Electronically Signed   By: Maurine Simmering M.D.   On: 05/23/2022 18:47   CT Abdomen Pelvis Wo Contrast  Result Date: 05/23/2022 CLINICAL  DATA:  Nonlocalized abdominal pain. EXAM: CT ABDOMEN AND PELVIS WITHOUT CONTRAST TECHNIQUE: Multidetector CT imaging of the abdomen and pelvis was performed following the standard protocol without IV contrast. RADIATION DOSE REDUCTION: This exam was performed according to the departmental dose-optimization program which includes automated exposure control, adjustment of the mA and/or kV according to patient size and/or use of iterative reconstruction technique. COMPARISON:  05/12/2016 FINDINGS: Lower chest: Clear lung bases. Normal heart size without pericardial or pleural effusion. Right coronary artery calcification. Hepatobiliary: Normal noncontrast appearance of the liver. Cholecystectomy, without biliary ductal dilatation. Pancreas: Normal, without mass or ductal dilatation. Spleen: New subtle splenic volume loss and hypoattenuation, including on 11/02. No perisplenic fluid. Adrenals/Urinary Tract: Normal adrenal glands. No renal calculi or hydronephrosis. No hydroureter or ureteric calculi. No bladder calculi. Stomach/Bowel: Normal stomach, without wall thickening. Normal colon and terminal ileum. Appendix not visualized. Normal small bowel. Vascular/Lymphatic: Aortic atherosclerosis. No abdominopelvic adenopathy. Reproductive: Normal uterus and adnexa.  Other: No free intraperitoneal air. Small volume pelvic fluid including on 54/2 is nonspecific. Musculoskeletal: Development of innumerable tiny sclerotic lesions throughout the pelvis and thoracolumbar spine. Trace L4-5 anterolisthesis. IMPRESSION: 1. Development of multiple sclerotic foci throughout the marrow space, highly suspicious for metastatic disease. Correlate with primary malignancy history and consider further evaluation with PET. 2. Given limitations of lack of oral or IV contrast, no acute process in the abdomen or pelvis. 3. Small volume free pelvic fluid, abnormal for age but nonspecific. 4. Hypoattenuation and volume loss within the spleen, new since 2017 and possibly related to small volume infarct. No perisplenic fluid to confirm acute or subacute time course. 5. Coronary artery atherosclerosis. Aortic Atherosclerosis (ICD10-I70.0). Electronically Signed   By: Abigail Miyamoto M.D.   On: 05/23/2022 18:11   DG Chest Port 1 View  Result Date: 05/23/2022 CLINICAL DATA:  AMS EXAM: PORTABLE CHEST 1 VIEW COMPARISON:  None Available. FINDINGS: The heart size and mediastinal contours are within normal limits. Both lungs are clear. No focal consolidation, pleural effusion or vascular congestion. The visualized skeletal structures are unremarkable. IMPRESSION: No active disease. Electronically Signed   By: Frazier Richards M.D.   On: 05/23/2022 18:02    EKG: My personal interpretation of EKG shows: NSR    Assessment/Plan Principal Problem:   Metastatic disease (HCC) Active Problems:   Hyponatremia   Hypoglycemia   Stage 3a chronic kidney disease (CKD) (HCC) - baseline SCr 1.1-1.4    Assessment and Plan: * Metastatic disease ([place]) Admit to observation bed. I do not think pt has pneumonia. Will not continue abx. Pt does not have any respiratory symptoms. Discussed with pt that she has multiple bony lesions. Total protein is elevated. Could be from dehydration. Will check for m-spike, LDH. May  need bony biopsy. Consider colonoscopy.  Metastatic disease ([place]) is a Acute illness/condition that poses a threat to life or bodily function.   Hypoglycemia Patient denies having any history of diabetes.  Is not taking any insulin or sulfonylureas.  Check a C-peptide level.  Continue with D5 normal saline.  Hyponatremia Acute. Pt denies taking any diuretics.  We will continue with normal saline.  Check TSH.  Check a.m. cortisol level.  Repeat CMP in the morning.  Stage 3a chronic kidney disease (CKD) (HCC) - baseline SCr 1.1-1.4 Chronic.   DVT prophylaxis: SQ Heparin Code Status: Full Code Family Communication: no family at bedside  Disposition Plan: return home  Consults called: none  Admission status: Observation, Med-Surg   Kristopher Oppenheim, DO [facility]  05/24/2022, 2:36 AM

## 2022-05-24 NOTE — ED Notes (Signed)
Called [facility] to transport patient to [facility] rm# [phone]

## 2022-05-24 NOTE — Subjective & Objective (Signed)
Chief complaint is diarrhea, poor appetite, weakness.  History of present illness.  66 year old African-American female with reported history of hypertension, presents to the ER today with a reported 4-day history of feeling weak and being in bed.  Family is not at bedside.  Patient is a poor historian.  She is unable to give any history review of systems.  She states that she is not taking any medications at all.  She is able to answer questions in yes and no fashion.  She does not give any detailed explanations.  She admits to being weak for the last 4 days.  She admits that she has been able to get up and use the bathroom.  She has not been eating or drinking.  She states that she lives with her son.  In the ER, serum glucose of 41.  Sodium 130, BUN of 17, creatinine 1.3  Total protein elevated 8.6, albumin 4.3 AST of 68, ALT of 13 alk phos of 66 total bili 1.3  Urinalysis shows specific gravity 1.018, ketones 40, protein 30, negative nitrates, negative leukocyte esterase  White count 7.9, hemoglobin 12.4, platelets 126  CT head was negative for any acute intracranial abnormality.  Chest CT which I have personally reviewed and interpreted was negative for any pneumonia.  Radiology interpretation states that there is minimal patchy groundglass opacities right lower lobe.  I disagree.  I do not see any infectious etiology in her right lower lobe.  I do concur with the multiple sclerotic osseous lesions in her vertebral spine.  [facility] hospitalist contacted for admission.

## 2022-05-24 NOTE — Plan of Care (Signed)
  Problem: Education: Goal: Knowledge of General Education information will improve Description Including pain rating scale, medication(s)/side effects and non-pharmacologic comfort measures Outcome: Progressing   

## 2022-05-25 ENCOUNTER — Inpatient Hospital Stay (HOSPITAL_COMMUNITY): Payer: 59

## 2022-05-25 DIAGNOSIS — R630 Anorexia: Secondary | ICD-10-CM

## 2022-05-25 DIAGNOSIS — N1832 Chronic kidney disease, stage 3b: Secondary | ICD-10-CM

## 2022-05-25 DIAGNOSIS — E860 Dehydration: Secondary | ICD-10-CM

## 2022-05-25 LAB — C-PEPTIDE: C-Peptide: 1.2 ng/mL (ref 1.1–4.4)

## 2022-05-25 LAB — BASIC METABOLIC PANEL
Anion gap: 7 (ref 5–15)
BUN: 14 mg/dL (ref 8–23)
CO2: 19 mmol/L — ABNORMAL LOW (ref 22–32)
Calcium: 8.2 mg/dL — ABNORMAL LOW (ref 8.9–10.3)
Chloride: 115 mmol/L — ABNORMAL HIGH (ref 98–111)
Creatinine, Ser: 2.07 mg/dL — ABNORMAL HIGH (ref 0.44–1.00)
GFR, Estimated: 26 mL/min — ABNORMAL LOW (ref >60)
Glucose, Bld: 73 mg/dL (ref 70–99)
Potassium: 4.6 mmol/L (ref 3.5–5.1)
Sodium: 141 mmol/L (ref 135–145)

## 2022-05-25 LAB — GLUCOSE, CAPILLARY
Glucose-Capillary: 104 mg/dL — ABNORMAL HIGH (ref 70–99)
Glucose-Capillary: 106 mg/dL — ABNORMAL HIGH (ref 70–99)
Glucose-Capillary: 88 mg/dL (ref 70–99)
Glucose-Capillary: 91 mg/dL (ref 70–99)
Glucose-Capillary: 92 mg/dL (ref 70–99)
Glucose-Capillary: 97 mg/dL (ref 70–99)

## 2022-05-25 MED ORDER — MIRTAZAPINE 15 MG PO TBDP
15.0000 mg | ORAL_TABLET | Freq: Every day | ORAL | Status: DC
  Filled 2022-05-25: qty 1

## 2022-05-25 MED ORDER — BOOST / RESOURCE BREEZE PO LIQD CUSTOM
1.0000 | Freq: Two times a day (BID) | ORAL | Status: DC
  Administered 2022-05-26: 237 mL via ORAL

## 2022-05-25 MED ORDER — ADULT MULTIVITAMIN W/MINERALS CH
1.0000 | ORAL_TABLET | Freq: Every day | ORAL | Status: DC
Start: 2022-05-25 — End: 2022-05-26
  Administered 2022-05-25 – 2022-05-26 (×2): 1 via ORAL
  Filled 2022-05-25 (×3): qty 1

## 2022-05-25 MED ORDER — ENSURE ENLIVE PO LIQD
237.0000 mL | Freq: Two times a day (BID) | ORAL | Status: DC
Start: 2022-05-25 — End: 2022-05-26
  Administered 2022-05-25: 237 mL via ORAL

## 2022-05-25 NOTE — Progress Notes (Signed)
Initial Nutrition Assessment RD working remotely.  DOCUMENTATION CODES:   Not applicable  INTERVENTION:  - will order Boost Breeze BID, each supplement provides 250 kcal and 9 grams of protein. - continue Ensure Plus High Protein BID, each supplement provides 350 kcal and 20 grams of protein. - complete NFPE when feasible.    NUTRITION DIAGNOSIS:   Inadequate oral intake related to acute illness, poor appetite as evidenced by per patient/family report.  GOAL:   Patient will meet greater than or equal to 90% of their needs  MONITOR:   PO intake, Supplement acceptance, Labs, Weight trends  REASON FOR ASSESSMENT:   Malnutrition Screening Tool, Consult Assessment of nutrition requirement/status  ASSESSMENT:   66 year old female with medical history of HTN, HLD, and erythropoietin deficiency anemia. She presented to the ED due to weakness and poor appetite. In the ED she was noted to have CBG of 41 mg/dl. She was found to have multiple sclerotic lesions throughout her vertebral spine on CT scan.  Megace and remeron started yesterday. Per flow sheet documentation, she ate 50% of breakfast and 25% of lunch yesterday; no intakes documented from dinner yesterday or meals today.   She has not been seen by a [place] RD since 05/2016.   Weight yesterday was 108 lb and PTA the most recently documented weight was 128 lb on 09/16/2018.  No information documented in the edema section of flow sheet.   Per notes: - poor appetite, dehydration, hyponatremia, and hypoglycemia on admission - depression - possible metastatic disease--Oncology recommends PET scan d/t no area that can be biopsied based on imaging done inpatient   Labs reviewed; CBGs: 106, 97, 104, 91 mg/dl, Na: 133 mmol/l, creatinine: 1.86 mg/dl, Ca: 8 mg/dl, GFR: 29 ml/min.  Medications reviewed; 400 mg megace BID started 4/9 AM, 15 mg remeron/day.  IVF; D5-NS @ 100 ml/hr (408 kcal/24 hours).     NUTRITION - FOCUSED  PHYSICAL EXAM:  RD working remotely.  Diet Order:   Diet Order             Diet regular Room service appropriate? Yes; Fluid consistency: Thin  Diet effective now                   EDUCATION NEEDS:   No education needs have been identified at this time  Skin:  Skin Assessment: Reviewed RN Assessment  Last BM:  6/10 (type 6 x1, medium amount)  Height:   Ht Readings from Last 1 Encounters:  05/24/22 '5\' 4"'$  (1.626 m)    Weight:   Wt Readings from Last 1 Encounters:  05/24/22 49.1 kg     BMI:  Body mass index is 18.58 kg/m.  Estimated Nutritional Needs:  Kcal:  1600-1800 kcal Protein:  80-95 grams Fluid:  >/= 1.8 L/day     Jarome Matin, MS, RD, LDN Registered Dietitian II Inpatient Clinical Nutrition RD pager # and on-call/weekend pager # available in [facility]

## 2022-05-25 NOTE — Progress Notes (Addendum)
[facility] Progress Note  Patient: [name]     PZW:[id number]  DOA: 05/23/2022   PCP: Silverio Decamp, MD       Brief hospital course: This is a 66 year old female who lives at home with her son and has a history of hypertension.  She presents to the hospital for poor appetite and weakness. Initially noted to be a very poor historian and unable to answer questions in a detailed manner.  Noted to have a glucose of 41 in the ED with ketones in the urine. Found to have multiple sclerotic lesions throughout her vertebral spine on the CT scans. Started on IV fluids.  6/9: Oral intake is poor although she is now more alert- flat affect- have discussed starting Megace for appetite stimulation-continue IV fluids  6/10- appeared depressed this AM however when I went back to check on her, she is happy and laughing - talking on the phone- sister is at bedside, she feels hungry now and plans to eat Has not ambulated today.  She states she only stood up at the bedside and when asked to sit in the chair, wanted to lay back down. We I discussed this with her and told her she cannot go home unless she ambulates, she is willing to ambulate immediately.   Assessment and Plan: Loss of appetite, dehydration, hyponatremia and hypoglycemia - continue Megace which appears to be helping - Cortisol, TSH normal - stop IVF   Hypokalemia -Replaced  AKI- CKD 4 - Cr rising from 1.3 on 6/8 to 2.07 today - she is not dehydrated and is urinating well today - stop IVF now as AKI can result from fluid overload as well - has not received any contrast - no edema on exam to suggest third spacing - check renal ultrasound  Possible metastatic disease - CT chest abd pelvis> innumerable sclerotic osseous lesions seen throughout the spine measuring less than 1 cm -No back pain and specifically no tenderness on my exam - Oncology recommends an outpatient PET scan as none of the imaging reveals an  area that could be biopsied. - SPEP/UPEP pending  DVT prophylaxis:  heparin injection 5,000 Units Start: 05/24/22 0600 SCDs Start: 05/24/22 0401     Code Status: Full Code  Consultants: oncology, Dr Alen Blew Level of Care: Level of care: Telemetry Disposition Plan:  Status is: Inpatient Remains inpatient appropriate because: rising creatinine  Objective:   Vitals:   05/24/22 1625 05/24/22 2010 05/25/22 0406 05/25/22 1358  BP: 127/87 115/83 112/84 122/82  Pulse: 79 77 69 67  Resp: (!) '21 17 20 16  '$ Temp: 98.2 F (36.8 C) 98.9 F (37.2 C) 98.2 F (36.8 C) 98.3 F (36.8 C)  TempSrc: Oral Oral Oral Oral  SpO2: 100% 100% 100% 100%  Weight:      Height:       Filed Weights   05/24/22 0125  Weight: 49.1 kg   Exam: General exam: Appears comfortable  HEENT: PERRLA, oral mucosa moist, no sclera icterus or thrush Respiratory system: Clear to auscultation. Respiratory effort normal. Cardiovascular system: S1 & S2 heard, regular rate and rhythm Gastrointestinal system: Abdomen soft, non-tender, nondistended. Normal bowel sounds   Central nervous system: Alert and oriented. No focal neurological deficits. Extremities: No cyanosis, clubbing or edema Skin: No rashes or ulcers Psychiatry: normal mood an affect  Imaging and lab data was personally reviewed    CBC: Recent Labs  Lab 05/23/22 1714 05/24/22 0547  WBC 7.9 4.6  NEUTROABS 6.4 3.0  HGB 12.4 9.8*  HCT 39.2 30.5*  MCV 72.3* 72.6*  PLT 126* 657*   Basic Metabolic Panel: Recent Labs  Lab 05/23/22 1714 05/24/22 0547 05/24/22 1559  NA 130* 133* 133*  K 4.5 3.2* 3.5  CL 87* 98 101  CO2 '24 25 23  '$ GLUCOSE 41* 109* 111*  BUN '17 19 18  '$ CREATININE 1.30* 1.76* 1.86*  CALCIUM 9.7 7.8* 8.0*  MG 1.7  --   --    GFR: Estimated Creatinine Clearance: 22.4 mL/min (A) (by C-G formula based on SCr of 1.86 mg/dL (H)).  Scheduled Meds:  feeding supplement  237 mL Oral BID BM   heparin  5,000 Units Subcutaneous Q8H    megestrol  400 mg Oral BID   mirtazapine  15 mg Oral QHS   Continuous Infusions:  dextrose 5 % and 0.9% NaCl 100 mL/hr at 05/25/22 0818     LOS: 1 day   Author: Debbe Odea  05/25/2022 2:49 PM

## 2022-05-25 NOTE — Plan of Care (Signed)
  Problem: Health Behavior/Discharge Planning: Goal: Ability to manage health-related needs will improve Outcome: Progressing   

## 2022-05-26 DIAGNOSIS — E785 Hyperlipidemia, unspecified: Secondary | ICD-10-CM

## 2022-05-26 DIAGNOSIS — D563 Thalassemia minor: Secondary | ICD-10-CM

## 2022-05-26 DIAGNOSIS — J189 Pneumonia, unspecified organism: Secondary | ICD-10-CM

## 2022-05-26 DIAGNOSIS — D631 Anemia in chronic kidney disease: Secondary | ICD-10-CM

## 2022-05-26 DIAGNOSIS — I10 Essential (primary) hypertension: Secondary | ICD-10-CM

## 2022-05-26 DIAGNOSIS — R197 Diarrhea, unspecified: Secondary | ICD-10-CM

## 2022-05-26 LAB — BASIC METABOLIC PANEL
Anion gap: 3 — ABNORMAL LOW (ref 5–15)
BUN: 13 mg/dL (ref 8–23)
CO2: 23 mmol/L (ref 22–32)
Calcium: 8.5 mg/dL — ABNORMAL LOW (ref 8.9–10.3)
Chloride: 115 mmol/L — ABNORMAL HIGH (ref 98–111)
Creatinine, Ser: 1.89 mg/dL — ABNORMAL HIGH (ref 0.44–1.00)
GFR, Estimated: 29 mL/min — ABNORMAL LOW (ref >60)
Glucose, Bld: 81 mg/dL (ref 70–99)
Potassium: 4.2 mmol/L (ref 3.5–5.1)
Sodium: 141 mmol/L (ref 135–145)

## 2022-05-26 LAB — CBC
HCT: 27.4 % — ABNORMAL LOW (ref 36.0–46.0)
Hemoglobin: 8.5 g/dL — ABNORMAL LOW (ref 12.0–15.0)
MCH: 23.1 pg — ABNORMAL LOW (ref 26.0–34.0)
MCHC: 31 g/dL (ref 30.0–36.0)
MCV: 74.5 fL — ABNORMAL LOW (ref 80.0–100.0)
Platelets: 167 10*3/uL (ref 150–400)
RBC: 3.68 MIL/uL — ABNORMAL LOW (ref 3.87–5.11)
RDW: 13.7 % (ref 11.5–15.5)
WBC: 4.5 10*3/uL (ref 4.0–10.5)
nRBC: 0 % (ref 0.0–0.2)

## 2022-05-26 LAB — GLUCOSE, CAPILLARY
Glucose-Capillary: 86 mg/dL (ref 70–99)
Glucose-Capillary: 69 mg/dL — ABNORMAL LOW (ref 70–99)
Glucose-Capillary: 99 mg/dL (ref 70–99)
Glucose-Capillary: 74 mg/dL (ref 70–99)

## 2022-05-26 MED ORDER — FOLIC ACID 400 MCG PO TABS: 400.0000 ug | ORAL_TABLET | Freq: Every day | ORAL | 0 refills | Status: AC

## 2022-05-26 MED ORDER — ADULT MULTIVITAMIN W/MINERALS CH: 1.0000 | ORAL_TABLET | Freq: Every day | ORAL | Status: AC

## 2022-05-26 MED ORDER — ENSURE ENLIVE PO LIQD: 237.0000 mL | Freq: Two times a day (BID) | ORAL | 12 refills | Status: DC

## 2022-05-26 MED ORDER — AMLODIPINE BESYLATE 10 MG PO TABS: 10.0000 mg | ORAL_TABLET | Freq: Every day | ORAL | 3 refills | Status: DC

## 2022-05-26 MED ORDER — ATORVASTATIN CALCIUM 40 MG PO TABS: 40.0000 mg | ORAL_TABLET | Freq: Every day | ORAL | 3 refills | Status: DC

## 2022-05-26 MED ORDER — LISINOPRIL 20 MG PO TABS: 20.0000 mg | ORAL_TABLET | Freq: Every day | ORAL | 3 refills | Status: DC

## 2022-05-26 NOTE — Discharge Summary (Incomplete)
Physician Discharge Summary  [name] IBB:[id number] DOB: 1956/12/11 DOA: 05/23/2022  PCP: Silverio Decamp, MD  Admit date: 05/23/2022 Discharge date: 05/26/2022 Discharging to: *** Recommendations for Outpatient Follow-up:  ***  Consults:  *** Procedures:  ***   Discharge Diagnoses:   Principal Problem:   Possible Metastatic disease ([name]) Active Problems:   Hyponatremia   Hypoglycemia   Stage 3b chronic kidney disease (CKD) ([name])   Dehydration     Hospital Course: ***  Principal Problem:   Possible Metastatic disease ([name]) Active Problems:   Hyponatremia   Hypoglycemia   Stage 3b chronic kidney disease (CKD) ([name])   Dehydration    Body mass index is 18.58 kg/m. Nutrition Status: Nutrition Problem: Inadequate oral intake Etiology: acute illness, poor appetite Signs/Symptoms: per patient/family report Interventions: Ensure Enlive (each supplement provides 350kcal and 20 grams of protein), Boost Breeze, Refer to RD note for recommendations    Discharge Instructions  Discharge Instructions     Diet - low sodium heart healthy   Complete by: As directed    Increase activity slowly   Complete by: As directed       Allergies as of 05/26/2022       Reactions   Isovue [iopamidol] Hives   Pt broke out in several facial hives, one on her back.  She will need full premeds in the future.  J Bohm No allergy demonstrated to  Orally ingested Iodinated agent. (Gastrographin CT Scan 05-12-16) Pt broke out in several facial hives, one on her back.  She will need full premeds in the future.  J Bohm   Iodine-131 Rash        Medication List     STOP taking these medications    CALCIUM CITRATE +D PO   magnesium oxide 400 (240 Mg) MG tablet Commonly known as: MAG-OX   OVER THE COUNTER MEDICATION   OVER THE COUNTER MEDICATION   Vitamin D-3 125 MCG (5000 UT) Tabs       TAKE these medications    amLODipine 10 MG tablet Commonly known as:  NORVASC Take 1 tablet (10 mg total) by mouth daily.   atorvastatin 40 MG tablet Commonly known as: LIPITOR Take 1 tablet (40 mg total) by mouth daily.   Calcium Gummies 250-100-500 MG-MG-UNIT Chew Generic drug: Calcium-Phosphorus-Vitamin D Chew 1 tablet by mouth daily.   ECHINACEA PO Take 1 capsule by mouth daily.   Elderberry 500 MG Caps Take 500 mg by mouth daily.   feeding supplement Liqd Take 237 mLs by mouth 2 (two) times daily between meals.   Flaxseed Oil 1200 MG Caps Take 1,200 mg by mouth daily.   folic acid 450 MCG tablet Commonly known as: FOLVITE Take 1 tablet (400 mcg total) by mouth daily. What changed: Another medication with the same name was removed. Continue taking this medication, and follow the directions you see here.   GENTLE IRON PO Take 1 capsule by mouth daily.   GERITOL PO Take 1 tablet by mouth daily.   lisinopril 20 MG tablet Commonly known as: ZESTRIL Take 1 tablet (20 mg total) by mouth daily.   MAGNESIUM GLYCINATE PO Take 400 mg by mouth daily.   multivitamin with minerals Tabs tablet Take 1 tablet by mouth daily.   PABA PO Take 1 tablet by mouth daily. PABA 700   Potassium 99 MG Tabs Take 99 mg by mouth daily.            The results of significant diagnostics from this  hospitalization (including imaging, microbiology, ancillary and laboratory) are listed below for reference.    US RENAL  Result Date: 05/25/2022 CLINICAL DATA:  Dehydration EXAM: RENAL / URINARY TRACT ULTRASOUND COMPLETE COMPARISON:  None Available. FINDINGS: Right Kidney: Renal measurements: 7.2 x 3.3 x 4.4 cm = volume: 55 mL. Echogenicity within normal limits. No mass or hydronephrosis visualized. Left Kidney: Renal measurements: 9.6 x 3.8 x 4.8 cm = volume: 92 mL. Echogenicity within normal limits. No mass or hydronephrosis visualized. Bladder: Appears normal for degree of bladder distention. Other: None. IMPRESSION: 1. Normal kidneys.  No hydronephrosis. 2.  Normal bladder Electronically Signed   By: Suzy Bouchard M.D.   On: 05/25/2022 18:30   CT Chest Wo Contrast  Result Date: 05/23/2022 CLINICAL DATA:  Pneumonia. EXAM: CT CHEST WITHOUT CONTRAST TECHNIQUE: Multidetector CT imaging of the chest was performed following the standard protocol without IV contrast. RADIATION DOSE REDUCTION: This exam was performed according to the departmental dose-optimization program which includes automated exposure control, adjustment of the mA and/or kV according to patient size and/or use of iterative reconstruction technique. COMPARISON:  Chest x-ray same day FINDINGS: Cardiovascular: No significant vascular findings. Normal heart size. No pericardial effusion. There are atherosclerotic calcifications of the aorta and coronary arteries. Mediastinum/Nodes: No enlarged mediastinal or axillary lymph nodes. There are few tiny foci of air inferior to the carina, indeterminate. There is likely a tracheal or esophageal diverticulum on the right at the level of the thoracic inlet measuring 8 x 13 by 11 mm. Thyroid gland, trachea, and esophagus demonstrate no significant findings. Lungs/Pleura: There are minimal patchy ground-glass opacities in the right lower lobe/lung base. The lungs are otherwise clear. There is no pleural effusion or pneumothorax. Upper Abdomen: Cholecystectomy clips are present. Musculoskeletal: There are innumerable sclerotic osseous lesions seen throughout the spine measuring less than 1 cm. No acute fractures are seen. IMPRESSION: 1. Minimal patchy ground-glass opacities in the right lower lobe, likely infectious/inflammatory. 2. Small amount of air inferior to the carina may represent pneumomediastinum versus small tracheal diverticula. 3. 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 4. Numerous sclerotic osseous lesions worrisome for metastatic disease. Electronically Signed   By: Ronney Asters M.D.   On: 05/23/2022  21:22   CT Head Wo Contrast  Result Date: 05/23/2022 CLINICAL DATA:  Mental status change, unknown cause EXAM: CT HEAD WITHOUT CONTRAST TECHNIQUE: Contiguous axial images were obtained from the base of the skull through the vertex without intravenous contrast. RADIATION DOSE REDUCTION: This exam was performed according to the departmental dose-optimization program which includes automated exposure control, adjustment of the mA and/or kV according to patient size and/or use of iterative reconstruction technique. COMPARISON:  None Available. FINDINGS: Brain: No evidence of acute intracranial hemorrhage or extra-axial collection.No evidence of mass lesion/concerning mass effect.The ventricles are normal in size. Vascular: No hyperdense vessel or unexpected calcification. Skull: Normal. Negative for fracture or focal lesion. Sinuses/Orbits: Mild mucosal thickening of the maxillary sinuses. Other: None. IMPRESSION: No acute intracranial abnormality. Electronically Signed   By: Maurine Simmering M.D.   On: 05/23/2022 18:47   CT Abdomen Pelvis Wo Contrast  Result Date: 05/23/2022 CLINICAL DATA:  Nonlocalized abdominal pain. EXAM: CT ABDOMEN AND PELVIS WITHOUT CONTRAST TECHNIQUE: Multidetector CT imaging of the abdomen and pelvis was performed following the standard protocol without IV contrast. RADIATION DOSE REDUCTION: This exam was performed according to the departmental dose-optimization program which includes automated exposure control, adjustment of the mA and/or kV according to  patient size and/or use of iterative reconstruction technique. COMPARISON:  05/12/2016 FINDINGS: Lower chest: Clear lung bases. Normal heart size without pericardial or pleural effusion. Right coronary artery calcification. Hepatobiliary: Normal noncontrast appearance of the liver. Cholecystectomy, without biliary ductal dilatation. Pancreas: Normal, without mass or ductal dilatation. Spleen: New subtle splenic volume loss and hypoattenuation,  including on 11/02. No perisplenic fluid. Adrenals/Urinary Tract: Normal adrenal glands. No renal calculi or hydronephrosis. No hydroureter or ureteric calculi. No bladder calculi. Stomach/Bowel: Normal stomach, without wall thickening. Normal colon and terminal ileum. Appendix not visualized. Normal small bowel. Vascular/Lymphatic: Aortic atherosclerosis. No abdominopelvic adenopathy. Reproductive: Normal uterus and adnexa. Other: No free intraperitoneal air. Small volume pelvic fluid including on 54/2 is nonspecific. Musculoskeletal: Development of innumerable tiny sclerotic lesions throughout the pelvis and thoracolumbar spine. Trace L4-5 anterolisthesis. IMPRESSION: 1. Development of multiple sclerotic foci throughout the marrow space, highly suspicious for metastatic disease. Correlate with primary malignancy history and consider further evaluation with PET. 2. Given limitations of lack of oral or IV contrast, no acute process in the abdomen or pelvis. 3. Small volume free pelvic fluid, abnormal for age but nonspecific. 4. Hypoattenuation and volume loss within the spleen, new since 2017 and possibly related to small volume infarct. No perisplenic fluid to confirm acute or subacute time course. 5. Coronary artery atherosclerosis. Aortic Atherosclerosis (ICD10-I70.0). Electronically Signed   By: Abigail Miyamoto M.D.   On: 05/23/2022 18:11   DG Chest Port 1 View  Result Date: 05/23/2022 CLINICAL DATA:  AMS EXAM: PORTABLE CHEST 1 VIEW COMPARISON:  None Available. FINDINGS: The heart size and mediastinal contours are within normal limits. Both lungs are clear. No focal consolidation, pleural effusion or vascular congestion. The visualized skeletal structures are unremarkable. IMPRESSION: No active disease. Electronically Signed   By: Frazier Richards M.D.   On: 05/23/2022 18:02   Labs:   Basic Metabolic Panel: Recent Labs  Lab 05/23/22 1714 05/24/22 0547 05/24/22 1559 05/25/22 1559 05/26/22 0653  NA 130*  133* 133* 141 141  K 4.5 3.2* 3.5 4.6 4.2  CL 87* 98 101 115* 115*  CO2 '24 25 23 '$ 19* 23  GLUCOSE 41* 109* 111* 73 81  BUN '17 19 18 14 13  '$ CREATININE 1.30* 1.76* 1.86* 2.07* 1.89*  CALCIUM 9.7 7.8* 8.0* 8.2* 8.5*  MG 1.7  --   --   --   --      CBC: Recent Labs  Lab 05/23/22 1714 05/24/22 0547 05/26/22 0653  WBC 7.9 4.6 4.5  NEUTROABS 6.4 3.0  --   HGB 12.4 9.8* 8.5*  HCT 39.2 30.5* 27.4*  MCV 72.3* 72.6* 74.5*  PLT 126* 110* 167         SIGNED:   Debbe Odea, MD  [facility] 05/26/2022, 10:18 AM

## 2022-05-26 NOTE — Discharge Instructions (Signed)

## 2022-05-26 NOTE — Progress Notes (Signed)
Patient discharged home with son. Discharge instructions reviewed with patient and son who verbalized understanding. Son asked for information on contacting Dr Antonieta Pert office and also about checking his mother's blood sugar. I advised him that he could purchase a blood glucose monitor at any pharmacy and could check her blood sugar as needed at home. He expressed gratitude for the information.

## 2022-05-26 NOTE — Discharge Summary (Signed)
Physician Discharge Summary  Lynde Ludwig GMW:[id number] DOB: Nov 26, 1956 DOA: 05/23/2022  PCP: Silverio Decamp, MD  Admit date: 05/23/2022 Discharge date: 05/26/2022 Discharging to: Home Recommendations for Outpatient Follow-up:  SPEP and UPEP pending-please follow Oncology, Dr. Alen Blew has stated that they will arrange a PET scan for her as an outpatient Needs outpatient follow-up for infiltrate in right lower lobe Continue to encourage oral intake-recommend creatinine to be rechecked in 1 week She has not been taking her blood pressure medications-please follow-up and ask if she is taking them  Consults:  Oncology Procedures:  None   Discharge Diagnoses:   Principal Problem:   Dehydration Active Problems:   Possible Metastatic disease ([place])   Hyponatremia   Hypoglycemia   Stage 3b chronic kidney disease (CKD) ([facility])   Hypertension   Hyperlipidemia with target LDL less than 100   Diarrhea   Erythropoietin deficiency anemia   Alpha thalassemia [facility])     Hospital Course: This is a 66 year old female who lives at home with her son and has a history of hypertension  She presents to the hospital for poor appetite and weakness. Initially noted to be a very poor historian and unable to answer questions in a detailed manner.    Per triage note: The patient has had diarrhea, poor appetite for 2 days, has been fatigued, weak and hard to arouse.  Noted to have a glucose of 41 in the ED with ketones in the urine. Found to have multiple sclerotic lesions throughout her vertebral spine on the CT scans. Started on IV fluids.   6/9: Oral intake is poor although she is now more alert- flat affect- have discussed starting Megace with the patient for appetite stimulation-she is in agreement with starting Megace-continue IV fluids - Have asked oncology to evaluate the patient-Dr. Alen Blew feels that she would benefit from an outpatient PET scan   6/10- appeared depressed this AM however  when I went back to check on her, she is happy and laughing - talking on the phone- sister is at bedside-she was able to eat and ambulate to the end of the hall which was a significant distance and a significant improvement for her  6/11-eating more than she was before-her brother is in the room and we have discussed that the patient was not taking her antihypertensives or her Lipitor at home-I have explained to her that I am sending these prescriptions to her pharmacy and she needs to take her medications as ordered-her brother is in the room and agrees  Principal problem Loss of appetite, dehydration, hyponatremia and hypoglycemia, underweight - continue Megace which appears to be helping her appetite to improve - Cortisol, TSH normal - stopped IVF -Ensure Enlive, boost breeze have also been ordered for her Body mass index is 18.58 kg/m.  Active problems: Infiltrate in right lower lobe-not pneumonia - The patient has no symptoms of cough fever or shortness of breath - Recommend follow-up imaging as outpatient  Hypokalemia -Replaced   AKI- CKD 4 - Cr rising from 1.3 on 6/8 to 2.07 today -6/10 > she is not dehydrated and is urinating well today - stop IVF now as AKI can result from fluid overload as well - has not received any contrast - no edema on exam to suggest third spacing -Renal ultrasound is normal - 6/11 > creatinine improved to 1.89 today   Possible metastatic disease - CT chest abd pelvis> innumerable sclerotic osseous lesions seen throughout the spine measuring less than 1 cm -No back  pain and specifically no tenderness on my exam - Oncology recommends an outpatient PET scan as none of the imaging reveals an area that could be biopsied. - SPEP/UPEP pending  History of alcohol thalassemia trait and erythropoietin deficiency - She was being managed by Dr. Marin Olp as outpatient -She has oral iron supplements prescribed for her     Discharge Instructions  Discharge  Instructions     Diet - low sodium heart healthy   Complete by: As directed    Increase activity slowly   Complete by: As directed       Allergies as of 05/26/2022       Reactions   Isovue [iopamidol] Hives   Pt broke out in several facial hives, one on her back.  She will need full premeds in the future.  J Bohm No allergy demonstrated to  Orally ingested Iodinated agent. (Gastrographin CT Scan 05-12-16) Pt broke out in several facial hives, one on her back.  She will need full premeds in the future.  J Bohm   Iodine-131 Rash        Medication List     STOP taking these medications    CALCIUM CITRATE +D PO   magnesium oxide 400 (240 Mg) MG tablet Commonly known as: MAG-OX   OVER THE COUNTER MEDICATION   OVER THE COUNTER MEDICATION   Vitamin D-3 125 MCG (5000 UT) Tabs       TAKE these medications    amLODipine 10 MG tablet Commonly known as: NORVASC Take 1 tablet (10 mg total) by mouth daily.   atorvastatin 40 MG tablet Commonly known as: LIPITOR Take 1 tablet (40 mg total) by mouth daily.   Calcium Gummies 250-100-500 MG-MG-UNIT Chew Generic drug: Calcium-Phosphorus-Vitamin D Chew 1 tablet by mouth daily.   ECHINACEA PO Take 1 capsule by mouth daily.   Elderberry 500 MG Caps Take 500 mg by mouth daily.   feeding supplement Liqd Take 237 mLs by mouth 2 (two) times daily between meals.   Flaxseed Oil 1200 MG Caps Take 1,200 mg by mouth daily.   folic acid 440 MCG tablet Commonly known as: FOLVITE Take 1 tablet (400 mcg total) by mouth daily. What changed: Another medication with the same name was removed. Continue taking this medication, and follow the directions you see here.   GENTLE IRON PO Take 1 capsule by mouth daily.   GERITOL PO Take 1 tablet by mouth daily.   lisinopril 20 MG tablet Commonly known as: ZESTRIL Take 1 tablet (20 mg total) by mouth daily.   MAGNESIUM GLYCINATE PO Take 400 mg by mouth daily.   multivitamin with  minerals Tabs tablet Take 1 tablet by mouth daily.   PABA PO Take 1 tablet by mouth daily. PABA 700   Potassium 99 MG Tabs Take 99 mg by mouth daily.            The results of significant diagnostics from this hospitalization (including imaging, microbiology, ancillary and laboratory) are listed below for reference.    US RENAL  Result Date: 05/25/2022 CLINICAL DATA:  Dehydration EXAM: RENAL / URINARY TRACT ULTRASOUND COMPLETE COMPARISON:  None Available. FINDINGS: Right Kidney: Renal measurements: 7.2 x 3.3 x 4.4 cm = volume: 55 mL. Echogenicity within normal limits. No mass or hydronephrosis visualized. Left Kidney: Renal measurements: 9.6 x 3.8 x 4.8 cm = volume: 92 mL. Echogenicity within normal limits. No mass or hydronephrosis visualized. Bladder: Appears normal for degree of bladder distention. Other: None. IMPRESSION: 1. Normal  kidneys.  No hydronephrosis. 2. Normal bladder Electronically Signed   By: Suzy Bouchard M.D.   On: 05/25/2022 18:30   CT Chest Wo Contrast  Result Date: 05/23/2022 CLINICAL DATA:  Pneumonia. EXAM: CT CHEST WITHOUT CONTRAST TECHNIQUE: Multidetector CT imaging of the chest was performed following the standard protocol without IV contrast. RADIATION DOSE REDUCTION: This exam was performed according to the departmental dose-optimization program which includes automated exposure control, adjustment of the mA and/or kV according to patient size and/or use of iterative reconstruction technique. COMPARISON:  Chest x-ray same day FINDINGS: Cardiovascular: No significant vascular findings. Normal heart size. No pericardial effusion. There are atherosclerotic calcifications of the aorta and coronary arteries. Mediastinum/Nodes: No enlarged mediastinal or axillary lymph nodes. There are few tiny foci of air inferior to the carina, indeterminate. There is likely a tracheal or esophageal diverticulum on the right at the level of the thoracic inlet measuring 8 x 13 by 11  mm. Thyroid gland, trachea, and esophagus demonstrate no significant findings. Lungs/Pleura: There are minimal patchy ground-glass opacities in the right lower lobe/lung base. The lungs are otherwise clear. There is no pleural effusion or pneumothorax. Upper Abdomen: Cholecystectomy clips are present. Musculoskeletal: There are innumerable sclerotic osseous lesions seen throughout the spine measuring less than 1 cm. No acute fractures are seen. IMPRESSION: 1. Minimal patchy ground-glass opacities in the right lower lobe, likely infectious/inflammatory. 2. Small amount of air inferior to the carina may represent pneumomediastinum versus small tracheal diverticula. 3. 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 4. Numerous sclerotic osseous lesions worrisome for metastatic disease. Electronically Signed   By: Ronney Asters M.D.   On: 05/23/2022 21:22   CT Head Wo Contrast  Result Date: 05/23/2022 CLINICAL DATA:  Mental status change, unknown cause EXAM: CT HEAD WITHOUT CONTRAST TECHNIQUE: Contiguous axial images were obtained from the base of the skull through the vertex without intravenous contrast. RADIATION DOSE REDUCTION: This exam was performed according to the departmental dose-optimization program which includes automated exposure control, adjustment of the mA and/or kV according to patient size and/or use of iterative reconstruction technique. COMPARISON:  None Available. FINDINGS: Brain: No evidence of acute intracranial hemorrhage or extra-axial collection.No evidence of mass lesion/concerning mass effect.The ventricles are normal in size. Vascular: No hyperdense vessel or unexpected calcification. Skull: Normal. Negative for fracture or focal lesion. Sinuses/Orbits: Mild mucosal thickening of the maxillary sinuses. Other: None. IMPRESSION: No acute intracranial abnormality. Electronically Signed   By: Maurine Simmering M.D.   On: 05/23/2022 18:47   CT Abdomen  Pelvis Wo Contrast  Result Date: 05/23/2022 CLINICAL DATA:  Nonlocalized abdominal pain. EXAM: CT ABDOMEN AND PELVIS WITHOUT CONTRAST TECHNIQUE: Multidetector CT imaging of the abdomen and pelvis was performed following the standard protocol without IV contrast. RADIATION DOSE REDUCTION: This exam was performed according to the departmental dose-optimization program which includes automated exposure control, adjustment of the mA and/or kV according to patient size and/or use of iterative reconstruction technique. COMPARISON:  05/12/2016 FINDINGS: Lower chest: Clear lung bases. Normal heart size without pericardial or pleural effusion. Right coronary artery calcification. Hepatobiliary: Normal noncontrast appearance of the liver. Cholecystectomy, without biliary ductal dilatation. Pancreas: Normal, without mass or ductal dilatation. Spleen: New subtle splenic volume loss and hypoattenuation, including on 11/02. No perisplenic fluid. Adrenals/Urinary Tract: Normal adrenal glands. No renal calculi or hydronephrosis. No hydroureter or ureteric calculi. No bladder calculi. Stomach/Bowel: Normal stomach, without wall thickening. Normal colon and terminal ileum. Appendix not visualized. Normal small  bowel. Vascular/Lymphatic: Aortic atherosclerosis. No abdominopelvic adenopathy. Reproductive: Normal uterus and adnexa. Other: No free intraperitoneal air. Small volume pelvic fluid including on 54/2 is nonspecific. Musculoskeletal: Development of innumerable tiny sclerotic lesions throughout the pelvis and thoracolumbar spine. Trace L4-5 anterolisthesis. IMPRESSION: 1. Development of multiple sclerotic foci throughout the marrow space, highly suspicious for metastatic disease. Correlate with primary malignancy history and consider further evaluation with PET. 2. Given limitations of lack of oral or IV contrast, no acute process in the abdomen or pelvis. 3. Small volume free pelvic fluid, abnormal for age but nonspecific. 4.  Hypoattenuation and volume loss within the spleen, new since 2017 and possibly related to small volume infarct. No perisplenic fluid to confirm acute or subacute time course. 5. Coronary artery atherosclerosis. Aortic Atherosclerosis (ICD10-I70.0). Electronically Signed   By: Abigail Miyamoto M.D.   On: 05/23/2022 18:11   DG Chest Port 1 View  Result Date: 05/23/2022 CLINICAL DATA:  AMS EXAM: PORTABLE CHEST 1 VIEW COMPARISON:  None Available. FINDINGS: The heart size and mediastinal contours are within normal limits. Both lungs are clear. No focal consolidation, pleural effusion or vascular congestion. The visualized skeletal structures are unremarkable. IMPRESSION: No active disease. Electronically Signed   By: Frazier Richards M.D.   On: 05/23/2022 18:02   Labs:   Basic Metabolic Panel: Recent Labs  Lab 05/23/22 1714 05/24/22 0547 05/24/22 1559 05/25/22 1559 05/26/22 0653  NA 130* 133* 133* 141 141  K 4.5 3.2* 3.5 4.6 4.2  CL 87* 98 101 115* 115*  CO2 '24 25 23 '$ 19* 23  GLUCOSE 41* 109* 111* 73 81  BUN '17 19 18 14 13  '$ CREATININE 1.30* 1.76* 1.86* 2.07* 1.89*  CALCIUM 9.7 7.8* 8.0* 8.2* 8.5*  MG 1.7  --   --   --   --      CBC: Recent Labs  Lab 05/23/22 1714 05/24/22 0547 05/26/22 0653  WBC 7.9 4.6 4.5  NEUTROABS 6.4 3.0  --   HGB 12.4 9.8* 8.5*  HCT 39.2 30.5* 27.4*  MCV 72.3* 72.6* 74.5*  PLT 126* 110* 167         SIGNED:   Debbe Odea, MD  [facility] 05/26/2022, 12:46 PM

## 2022-05-27 ENCOUNTER — Telehealth: Payer: Self-pay | Admitting: General Practice

## 2022-05-27 ENCOUNTER — Telehealth: Payer: Self-pay

## 2022-05-27 ENCOUNTER — Other Ambulatory Visit: Payer: Self-pay | Admitting: Oncology

## 2022-05-27 DIAGNOSIS — C799 Secondary malignant neoplasm of unspecified site: Secondary | ICD-10-CM

## 2022-05-27 LAB — PROTEIN ELECTROPHORESIS, SERUM
A/G Ratio: 1.1 (ref 0.7–1.7)
Albumin ELP: 3.2 g/dL (ref 2.9–4.4)
Alpha-1-Globulin: 0.2 g/dL (ref 0.0–0.4)
Alpha-2-Globulin: 0.5 g/dL (ref 0.4–1.0)
Beta Globulin: 0.9 g/dL (ref 0.7–1.3)
Gamma Globulin: 1.2 g/dL (ref 0.4–1.8)
Globulin, Total: 2.8 g/dL (ref 2.2–3.9)
Total Protein ELP: 6 g/dL (ref 6.0–8.5)

## 2022-05-27 NOTE — Telephone Encounter (Signed)
Transition Care Management Follow-up Telephone Call Date of discharge and from where: 05/26/22 from [facility] How have you been since you were released from the hospital? Her son stated that she is doing ok. Any questions or concerns? No  Items Reviewed: Did the pt receive and understand the discharge instructions provided? Yes  Medications obtained and verified? No  Other? No  Any new allergies since your discharge? No  Dietary orders reviewed? Yes Do you have support at home? Yes   Home Care and Equipment/Supplies: Were home health services ordered? no  Functional Questionnaire: (I = Independent and D = Dependent) ADLs: I  Bathing/Dressing- I  Meal Prep- I  Eating- I  Maintaining continence- I  Transferring/Ambulation- I  Managing Meds- I  Follow up appointments reviewed:  PCP Hospital f/u appt confirmed? Yes  Scheduled to see Dr. Darene Lamer on 06/10/22 @ 1130. [facility] f/u appt confirmed? No  She was referred to Dr. Antonieta Pert office and patient's son has called and left a message to get an appointment with him. Are transportation arrangements needed? No  If their condition worsens, is the pt aware to call PCP or go to the Emergency Dept.? Yes Was the patient provided with contact information for the PCP's office or ED? Yes Was to pt encouraged to call back with questions or concerns? Yes

## 2022-05-27 NOTE — Telephone Encounter (Signed)
Transition Care Management Unsuccessful Follow-up Telephone Call  Date of discharge and from where:  05/26/22 from [facility]  Attempts:  1st Attempt  Reason for unsuccessful TCM follow-up call:  RNCM left HIPAA compliant message with person answering the call.  Thea Silversmith, RN, MSN, BSN, CCM Care Management Coordinator [facility] [phone]

## 2022-05-28 ENCOUNTER — Encounter: Payer: Self-pay | Admitting: *Deleted

## 2022-05-28 LAB — PROTEIN ELECTRO, RANDOM URINE
Albumin ELP, Urine: 49.4 %
Alpha-1-Globulin, U: 2 %
Alpha-2-Globulin, U: 11.7 %
Beta Globulin, U: 19.9 %
Gamma Globulin, U: 17.1 %
Total Protein, Urine: 43.6 mg/dL

## 2022-05-28 NOTE — Progress Notes (Signed)
Reached out to [name] to introduce myself as the office RN Navigator and explain our new patient process. Spoke to her son [name]. Reviewed the reason for their referral and scheduled their new patient appointment along with labs. Provided address and directions to the office including call back phone number. Reviewed with patient any concerns they may have or any possible barriers to attending their appointment.   Informed patient about my role as a navigator and that I will meet with them prior to their New Patient appointment and more fully discuss what services I can provide. At this time patient has no further questions or needs.    Oncology Nurse Navigator Documentation     05/28/2022   10:00 AM  Oncology Nurse Navigator Flowsheets  Abnormal Finding Date 05/23/2022  Diagnosis Status Additional Work Up  Navigator Follow Up Date: 05/30/2022  Navigator Follow Up Reason: New Patient Appointment  Navigator Location [facility]  Referral Date to RadOnc/MedOnc 05/27/2022  Navigator Encounter Type Introductory Phone Call  Patient Visit Type MedOnc  Treatment Phase Abnormal Scans  Barriers/Navigation Needs Coordination of Care;Education  Education Other  Interventions Coordination of Care;Education  Acuity Level 2-Minimal Needs (1-2 Barriers Identified)  Coordination of Care Appts  Education Method Verbal  Support Groups/Services Friends and Family  Time Spent with Patient 30

## 2022-05-29 ENCOUNTER — Telehealth: Payer: Self-pay | Admitting: Dietician

## 2022-05-29 NOTE — Telephone Encounter (Signed)
Patient screened on MST. First attempt to reach. I called home telephone # but it went straight to voice mail and mail box was full.  I sent an email and provided my contact information to return call to set up a nutrition consult, but received error message that it was undeliverable.  April Manson, RDN, LDN Registered Dietitian, [place] Part Time Remote (Usual office hours: Tuesday-Thursday) Cell: [phone]

## 2022-05-30 ENCOUNTER — Inpatient Hospital Stay: Payer: 59

## 2022-05-30 ENCOUNTER — Inpatient Hospital Stay: Payer: 59 | Attending: Hematology & Oncology

## 2022-05-30 ENCOUNTER — Encounter: Payer: Self-pay | Admitting: Hematology & Oncology

## 2022-05-30 ENCOUNTER — Inpatient Hospital Stay (HOSPITAL_BASED_OUTPATIENT_CLINIC_OR_DEPARTMENT_OTHER): Payer: 59 | Admitting: Hematology & Oncology

## 2022-05-30 ENCOUNTER — Other Ambulatory Visit: Payer: Self-pay | Admitting: *Deleted

## 2022-05-30 ENCOUNTER — Other Ambulatory Visit: Payer: Self-pay | Admitting: Oncology

## 2022-05-30 ENCOUNTER — Encounter: Payer: Self-pay | Admitting: *Deleted

## 2022-05-30 VITALS — BP 145/80 | HR 53 | Temp 98.0°F | Resp 20 | Ht 61.0 in | Wt 118.1 lb

## 2022-05-30 DIAGNOSIS — M899 Disorder of bone, unspecified: Secondary | ICD-10-CM | POA: Insufficient documentation

## 2022-05-30 DIAGNOSIS — D509 Iron deficiency anemia, unspecified: Secondary | ICD-10-CM

## 2022-05-30 DIAGNOSIS — I10 Essential (primary) hypertension: Secondary | ICD-10-CM | POA: Diagnosis not present

## 2022-05-30 DIAGNOSIS — R197 Diarrhea, unspecified: Secondary | ICD-10-CM | POA: Diagnosis not present

## 2022-05-30 DIAGNOSIS — Z79899 Other long term (current) drug therapy: Secondary | ICD-10-CM | POA: Insufficient documentation

## 2022-05-30 DIAGNOSIS — C799 Secondary malignant neoplasm of unspecified site: Secondary | ICD-10-CM

## 2022-05-30 DIAGNOSIS — C7951 Secondary malignant neoplasm of bone: Secondary | ICD-10-CM

## 2022-05-30 LAB — SAMPLE TO BLOOD BANK

## 2022-05-30 LAB — CBC WITH DIFFERENTIAL (CANCER CENTER ONLY)
Abs Immature Granulocytes: 0.06 10*3/uL (ref 0.00–0.07)
Basophils Absolute: 0 10*3/uL (ref 0.0–0.1)
Basophils Relative: 0 %
Eosinophils Absolute: 0.1 10*3/uL (ref 0.0–0.5)
Eosinophils Relative: 2 %
HCT: 24.1 % — ABNORMAL LOW (ref 36.0–46.0)
Hemoglobin: 7.5 g/dL — ABNORMAL LOW (ref 12.0–15.0)
Immature Granulocytes: 2 %
Lymphocytes Relative: 37 %
Lymphs Abs: 1.5 10*3/uL (ref 0.7–4.0)
MCH: 23.1 pg — ABNORMAL LOW (ref 26.0–34.0)
MCHC: 31.1 g/dL (ref 30.0–36.0)
MCV: 74.2 fL — ABNORMAL LOW (ref 80.0–100.0)
Monocytes Absolute: 0.4 10*3/uL (ref 0.1–1.0)
Monocytes Relative: 9 %
Neutro Abs: 2 10*3/uL (ref 1.7–7.7)
Neutrophils Relative %: 50 %
Platelet Count: 283 10*3/uL (ref 150–400)
RBC: 3.25 MIL/uL — ABNORMAL LOW (ref 3.87–5.11)
RDW: 14.1 % (ref 11.5–15.5)
WBC Count: 4 10*3/uL (ref 4.0–10.5)
nRBC: 0 % (ref 0.0–0.2)

## 2022-05-30 LAB — CMP (CANCER CENTER ONLY)
ALT: 7 U/L (ref 0–44)
AST: 31 U/L (ref 15–41)
Albumin: 3.5 g/dL (ref 3.5–5.0)
Alkaline Phosphatase: 41 U/L (ref 38–126)
Anion gap: 6 (ref 5–15)
BUN: 20 mg/dL (ref 8–23)
CO2: 26 mmol/L (ref 22–32)
Calcium: 9.2 mg/dL (ref 8.9–10.3)
Chloride: 108 mmol/L (ref 98–111)
Creatinine: 1.99 mg/dL — ABNORMAL HIGH (ref 0.44–1.00)
GFR, Estimated: 27 mL/min — ABNORMAL LOW (ref >60)
Glucose, Bld: 75 mg/dL (ref 70–99)
Potassium: 4.2 mmol/L (ref 3.5–5.1)
Sodium: 140 mmol/L (ref 135–145)
Total Bilirubin: 0.5 mg/dL (ref 0.3–1.2)
Total Protein: 6.5 g/dL (ref 6.5–8.1)

## 2022-05-30 LAB — PREALBUMIN: Prealbumin: 10.5 mg/dL — ABNORMAL LOW (ref 18–38)

## 2022-05-30 LAB — IRON AND IRON BINDING CAPACITY (CC-WL,HP ONLY)
Iron: 68 ug/dL (ref 28–170)
Saturation Ratios: 36 % — ABNORMAL HIGH (ref 10.4–31.8)
TIBC: 190 ug/dL — ABNORMAL LOW (ref 250–450)
UIBC: 122 ug/dL — ABNORMAL LOW (ref 148–442)

## 2022-05-30 LAB — RETICULOCYTES
Immature Retic Fract: 13.8 % (ref 2.3–15.9)
RBC.: 3.23 MIL/uL — ABNORMAL LOW (ref 3.87–5.11)
Retic Count, Absolute: 39.1 10*3/uL (ref 19.0–186.0)
Retic Ct Pct: 1.2 % (ref 0.4–3.1)

## 2022-05-30 LAB — FERRITIN: Ferritin: 238 ng/mL (ref 11–307)

## 2022-05-30 LAB — LACTATE DEHYDROGENASE: LDH: 224 U/L — ABNORMAL HIGH (ref 98–192)

## 2022-05-30 LAB — PREPARE RBC (CROSSMATCH)

## 2022-05-30 NOTE — Progress Notes (Signed)
Initial RN Navigator Patient Visit  Name: [name] Date of Referral : 05/27/22 Diagnosis: Bone Lesions  Met with patient prior to their visit with MD. Hanley Seamen patient "Your Patient Navigator" handout which explains my role, areas in which I am able to help, and all the contact information for myself and the office. Also gave patient MD and Navigator business card. Reviewed with patient the general overview of expected course after initial diagnosis and time frame for all steps to be completed.  Patient lives with her son, [name] and reports having multiple other supportive family and friends. She works full time nights at the [employer]. She reports the job to be moderately physical. Her son asks for appointments on Monday or Tuesday so that he can be present.   Patient completed visit with Dr. Marin Olp  She will need PET and Bone Marrow Biopsy. Will schedule once PA obtained. She will be in the office tomorrow getting transfused and hopefully all appointments can be made and reviewed with her then.   Patient understands all follow up procedures and expectations. They have my number to reach out for any further clarification or additional needs.    Oncology Nurse Navigator Documentation     05/30/2022   11:00 AM  Oncology Nurse Navigator Flowsheets  Navigator Follow Up Date: 05/31/2022  Navigator Follow Up Reason: Appointment Review  Navigator Location [facility]  Navigator Encounter Type Initial MedOnc  Patient Visit Type MedOnc  Treatment Phase Abnormal Scans  Barriers/Navigation Needs Coordination of Care;Education  Education Pain/ Symptom Management;Preparing for Upcoming Surgery/ Treatment;Newly Diagnosed Cancer Education  Interventions Education;Psycho-Social Support  Acuity Level 2-Minimal Needs (1-2 Barriers Identified)  Education Method Verbal;Written  Support Groups/Services Friends and Family  Time Spent with Patient 23

## 2022-05-30 NOTE — Progress Notes (Signed)
Referral MD  Reason for Referral: Anemia-microcytic and sclerotic bone lesions  Chief Complaint  Patient presents with   Follow-up   New Patient (Initial Visit)    Spinal lesions.  : I just feel tired.  HPI: [name] is a very nice 66 year old Afro-American female.  She comes in with her son.Marland Kitchen  She was recently admitted to was in the hospital.  I think this was about a week or so ago.  At that time, she was having some problems with diarrhea and weakness.  She had about a 4-day history of symptoms.  She subsequently came to the emergency room.  She had mild thrombocytopenia.  She had a creatinine 1.3.  Her total protein was 8.6.  She underwent a CT of the abdomen pelvis.  Everything was unremarkable.  However, there were noted to be multiple sclerotic foci throughout the marrow space.  CT of the chest showed again numerous sclerotic lesions.  She was seen by oncology.  However, the felt that she needed outpatient follow-up.  She works at the [employer].  She is worked there for about 40 years.  She has had no obvious bleeding.  There is been no melena or bright red blood per rectum.  She is had no cough.  She has had no headache.  There is been no obvious change in bowel or bladder habits.  She does not smoke.  She does not drink.  While in the hospital, she had some work-up done.  She did not have a monoclonal spike in her blood.  Her LDH was 224.  Currently, I would have said that her performance status is probably ECOG 1.    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   Hyperlipidemia    Hypertension   :   Past Surgical History:  Procedure Laterality Date   CHOLECYSTECTOMY N/A 05/21/2016   Procedure: LAPAROSCOPIC CHOLECYSTECTOMY;  Surgeon: Erroll Luna, MD;  Location: [facility];  Service: General;  Laterality: N/A;   COLONOSCOPY     GIVENS CAPSULE STUDY  N/A 07/22/2016   Procedure: GIVENS CAPSULE STUDY;  Surgeon: Carol Ada, MD;  Location: [facility];  Service: Endoscopy;  Laterality: N/A;  :   Current Outpatient Medications:    folic acid (FOLVITE) 852 MCG tablet, Take 1 tablet (400 mcg total) by mouth daily., Disp: 30 tablet, Rfl: 0   Iron-Vitamins (GERITOL PO), Take 1 tablet by mouth daily., Disp: , Rfl:    amLODipine (NORVASC) 10 MG tablet, Take 1 tablet (10 mg total) by mouth daily. (Patient not taking: Reported on 05/30/2022), Disp: 90 tablet, Rfl: 3   atorvastatin (LIPITOR) 40 MG tablet, Take 1 tablet (40 mg total) by mouth daily. (Patient not taking: Reported on 05/30/2022), Disp: 90 tablet, Rfl: 3   Calcium-Phosphorus-Vitamin D (CALCIUM GUMMIES) [phone] MG-MG-UNIT CHEW, Chew 1 tablet by mouth daily. (Patient not taking: Reported on 05/30/2022), Disp: , Rfl:    ECHINACEA PO, Take 1 capsule by mouth daily. (Patient not taking: Reported on 05/30/2022), Disp: , Rfl:    Elderberry 500 MG CAPS, Take 500 mg by mouth daily. (Patient not taking: Reported on 05/30/2022), Disp: , Rfl:    Fe Bisgly-Vit C-Vit B12-FA (GENTLE IRON PO), Take 1 capsule by mouth daily. (Patient not taking: Reported on 05/30/2022), Disp: , Rfl:    feeding supplement (ENSURE ENLIVE / ENSURE PLUS) LIQD, Take 237 mLs by mouth 2 (  two) times daily between meals. (Patient not taking: Reported on 05/30/2022), Disp: 237 mL, Rfl: 12   Flaxseed, Linseed, (FLAXSEED OIL) 1200 MG CAPS, Take 1,200 mg by mouth daily. (Patient not taking: Reported on 05/30/2022), Disp: , Rfl:    lisinopril (ZESTRIL) 20 MG tablet, Take 1 tablet (20 mg total) by mouth daily. (Patient not taking: Reported on 05/30/2022), Disp: 90 tablet, Rfl: 3   MAGNESIUM GLYCINATE PO, Take 400 mg by mouth daily. (Patient not taking: Reported on 05/30/2022), Disp: , Rfl:    Multiple Vitamin (MULTIVITAMIN WITH MINERALS) TABS tablet, Take 1 tablet by mouth daily. (Patient not taking: Reported on 05/30/2022), Disp: , Rfl:     P-Aminobenzoic Acid (PABA PO), Take 1 tablet by mouth daily. PABA 700 (Patient not taking: Reported on 05/30/2022), Disp: , Rfl:    Potassium 99 MG TABS, Take 99 mg by mouth daily. (Patient not taking: Reported on 05/30/2022), Disp: , Rfl: :  :   Allergies  Allergen Reactions   Isovue [Iopamidol] Hives    Pt broke out in several facial hives, one on her back.  She will need full premeds in the future.  J Bohm No allergy demonstrated to  Orally ingested Iodinated agent. (Gastrographin CT Scan 05-12-16) Pt broke out in several facial hives, one on her back.  She will need full premeds in the future.  J Bohm   Iodine-131 Rash  :   Family History  Problem Relation Age of Onset   Stroke Mother    Hyperlipidemia Mother    Diabetes Mother   :   Social History   Socioeconomic History   Marital status: Divorced    Spouse name: Not on file   Number of children: Not on file   Years of education: Not on file   Highest education level: Not on file  Occupational History   Not on file  Tobacco Use   Smoking status: Never   Smokeless tobacco: Never  Vaping Use   Vaping Use: Never used  Substance and Sexual Activity   Alcohol use: No    Alcohol/week: 0.0 standard drinks of alcohol   Drug use: No   Sexual activity: Not Currently  Other Topics Concern   Not on file  Social History Narrative   Not on file   Social Determinants of Health   Financial Resource Strain: Not on file  Food Insecurity: Not on file  Transportation Needs: Not on file  Physical Activity: Not on file  Stress: Not on file  Social Connections: Not on file  Intimate Partner Violence: Not on file  :  Review of Systems  Constitutional:  Positive for malaise/fatigue and weight loss.  HENT: Negative.    Eyes: Negative.   Respiratory:  Positive for shortness of breath.   Cardiovascular: Negative.   Gastrointestinal:  Positive for nausea.  Genitourinary: Negative.   Musculoskeletal: Negative.   Skin:  Negative.   Neurological: Negative.   Endo/Heme/Allergies: Negative.   Psychiatric/Behavioral: Negative.       Exam: _0 @  Physical exam shows vital signs with a temperature of 98.  Pulse 53.  Blood pressure 145/80.  Weight is 118 pounds.  Physical Exam Vitals reviewed.  HENT:     Head: Normocephalic and atraumatic.  Eyes:     Pupils: Pupils are equal, round, and reactive to light.  Cardiovascular:     Rate and Rhythm: Normal rate and regular rhythm.     Heart sounds: Normal heart sounds.  Pulmonary:     Effort:  Pulmonary effort is normal.     Breath sounds: Normal breath sounds.  Abdominal:     General: Bowel sounds are normal.     Palpations: Abdomen is soft.  Musculoskeletal:        General: No tenderness or deformity. Normal range of motion.     Cervical back: Normal range of motion.  Lymphadenopathy:     Cervical: No cervical adenopathy.  Skin:    General: Skin is warm and dry.     Findings: No erythema or rash.  Neurological:     Mental Status: She is alert and oriented to person, place, and time.  Psychiatric:        Behavior: Behavior normal.        Thought Content: Thought content normal.        Judgment: Judgment normal.    Recent Labs    05/30/22 1100  WBC 4.0  HGB 7.5*  HCT 24.1*  PLT 283    Recent Labs    05/30/22 1100  NA 140  K 4.2  CL 108  CO2 26  GLUCOSE 75  BUN 20  CREATININE 1.99*  CALCIUM 9.2    Blood smear review: There is some microcytic red blood cells.  I see no nucleated red blood cells.  I see no teardrop cells.  She has no schistocytes.  There is no rouleaux formation.  White blood cells appear normal in morphology and maturation.  I do not see any hypersegmented polys.  There is no immature myeloid or lymphoid forms.  Platelets are adequate number and size.  Platelets are well granulated.  Pathology: None    Assessment and Plan: [name] is a very charming 66 year old Afro-American female.  She clearly has  some type of hematologic issue.  Her hemoglobin keeps dropping.  I did do a rectal exam on her today.  She has some external hemorrhoids but her stool was brown and heme-negative.  Again her hemoglobin is down to 7.7.  MCV is low at 74.  She had iron studies done a week ago.  As such I would think that they would be okay.  She is going need to be transfused.  I think this can be helpful for her.  I talked to her about this.  She agrees to the transfusion.  Ultimately, I think she is going need to have a bone marrow biopsy done.  It is hard to say if she has any type of plasmacytic problem.  I would think if that were the case, she would have an abnormality with her total protein and albumin.  I suppose myelodysplasia is always a possibility.  Within the bones might not even be malignant.  It may be Paget's disease or some benign bone disorder.  I understand the fact that it be possible to get a bone biopsy that would give [place] any information.  This is quite challenging.  Our goal is obviously quality of life.  I really would like to see her blood count get better.  We will check an erythropoietin level on her.  Again, I really suspect that this can be a bone marrow biopsy that we will give [place] the diagnosis.  I suppose she could always have non-secretory myeloma.  However, 1 way to make this diagnosis is with a bone marrow biopsy.  We will try to have the bone marrow biopsy set up in about 2 weeks or so.  She will have her transfusion tomorrow.  Her reticulocyte count is quite  low so again she not hemolyzing.  She is very nice.  I gave her a prayer blanket.  She has a strong faith.  I answered all their questions.  We will try to get her back after she has the bone marrow biopsy done so we know what we might be dealing with.

## 2022-05-31 ENCOUNTER — Inpatient Hospital Stay: Payer: 59

## 2022-05-31 ENCOUNTER — Encounter: Payer: Self-pay | Admitting: *Deleted

## 2022-05-31 ENCOUNTER — Encounter: Payer: Self-pay | Admitting: Family

## 2022-05-31 DIAGNOSIS — D509 Iron deficiency anemia, unspecified: Secondary | ICD-10-CM | POA: Diagnosis not present

## 2022-05-31 DIAGNOSIS — C7951 Secondary malignant neoplasm of bone: Secondary | ICD-10-CM

## 2022-05-31 LAB — CANCER ANTIGEN 27.29: CA 27.29: 23 U/mL (ref 0.0–38.6)

## 2022-05-31 LAB — ERYTHROPOIETIN: Erythropoietin: 15.9 m[IU]/mL (ref 2.6–18.5)

## 2022-05-31 LAB — CEA (IN HOUSE-CHCC): CEA (CHCC-In House): 2.68 ng/mL (ref 0.00–5.00)

## 2022-05-31 MED ORDER — HEPARIN SOD (PORK) LOCK FLUSH 100 UNIT/ML IV SOLN: 500.0000 [IU] | Freq: Every day | INTRAVENOUS | Status: DC | PRN

## 2022-05-31 MED ORDER — SODIUM CHLORIDE 0.9% FLUSH: 10.0000 mL | INTRAVENOUS | Status: DC | PRN

## 2022-05-31 MED ORDER — SODIUM CHLORIDE 0.9% IV SOLUTION: 250.0000 mL | Freq: Once | INTRAVENOUS | Status: DC

## 2022-05-31 MED ORDER — SODIUM CHLORIDE 0.9% FLUSH: 3.0000 mL | INTRAVENOUS | Status: DC | PRN

## 2022-05-31 MED ORDER — HEPARIN SOD (PORK) LOCK FLUSH 100 UNIT/ML IV SOLN: 250.0000 [IU] | INTRAVENOUS | Status: DC | PRN

## 2022-05-31 NOTE — Progress Notes (Signed)
Patient needs a PET scan and Bone Marrow Biopsy scheduled.   PET scheduled for 06/17/2022. Reviewed with patient her appointment date, time and location. Reviewed PET prep. Radiology info sheet also reviewed and provided to patient for reinforcement of education.   Bone Marrow Biopsy scheduled for 06/06/2022. She knows she needs to arrive to [facility] at 9am, be NPO after midnight and that she will need a driver. Also provided these instructions in written form.  Oncology Nurse Navigator Documentation     05/31/2022    9:00 AM  Oncology Nurse Navigator Flowsheets  Navigator Follow Up Date: 06/06/2022  Navigator Follow Up Reason: Other:  Navigator Location [facility]  Navigator Encounter Type Appt/Treatment Plan Review  Patient Visit Type MedOnc  Treatment Phase Abnormal Scans  Barriers/Navigation Needs Coordination of Care;Education  Education Other  Interventions Coordination of Care;Education;Psycho-Social Support  Acuity Level 2-Minimal Needs (1-2 Barriers Identified)  Coordination of Care Radiology  Education Method Verbal;Written;Teach-back  Support Groups/Services Friends and Family  Time Spent with Patient 6

## 2022-05-31 NOTE — Patient Instructions (Signed)
Blood Transfusion, Adult A blood transfusion is a procedure in which you receive blood through an IV tube. You may need this procedure because of: A bleeding disorder. An illness. An injury. A surgery. The blood may come from someone else (a donor). You may also be able to donate blood for yourself. The blood given in a transfusion is made up of different types of cells. You may get: Red blood cells. These carry oxygen to the cells in the body. White blood cells. These help you fight infections. Platelets. These help your blood to clot. Plasma. This is the liquid part of your blood. It carries proteins and other substances through the body. If you have a clotting disorder, you may also get other types of blood products. Tell your doctor about: Any blood disorders you have. Any reactions you have had during a blood transfusion in the past. Any allergies you have. All medicines you are taking, including vitamins, herbs, eye drops, creams, and over-the-counter medicines. Any surgeries you have had. Any medical conditions you have. This includes any recent fever or cold symptoms. Whether you are pregnant or may be pregnant. What are the risks? Generally, this is a safe procedure. However, problems may occur. The most common problems include: A mild allergic reaction. This includes red, swollen areas of skin (hives) and itching. Fever or chills. This may be the body's response to new blood cells received. This may happen during or up to 4 hours after the transfusion. More serious problems may include: Too much fluid in the lungs. This may cause breathing problems. A serious allergic reaction. This includes breathing trouble or swelling around the face and lips. Lung injury. This causes breathing trouble and low oxygen in the blood. This can happen within hours of the transfusion or days later. Too much iron. This can happen after getting many blood transfusions over a period of time. An  infection or virus passed through the blood. This is rare. Donated blood is carefully tested before it is given. Your body's defense system (immune system) trying to attack the new blood cells. This is rare. Symptoms may include fever, chills, nausea, low blood pressure, and low back or chest pain. Donated cells attacking healthy tissues. This is rare. What happens before the procedure? Medicines Ask your doctor about: Changing or stopping your normal medicines. This is important. Taking aspirin and ibuprofen. Do not take these medicines unless your doctor tells you to take them. Taking over-the-counter medicines, vitamins, herbs, and supplements. General instructions Follow instructions from your doctor about what you cannot eat or drink. You will have a blood test to find out your blood type. The test also finds out what type of blood your body will accept and matches it to the donor type. If you are going to have a planned surgery, you may be able to donate your own blood. This may be done in case you need a transfusion. You will have your temperature, blood pressure, and pulse checked. You may receive medicine to help prevent an allergic reaction. This may be done if you have had a reaction to a transfusion before. This medicine may be given to you by mouth or through an IV tube. This procedure lasts about 1-4 hours. Plan for the time you need. What happens during the procedure?  An IV tube will be put into one of your veins. The bag of donated blood will be attached to your IV tube. Then, the blood will enter through your vein. Your temperature,   blood pressure, and pulse will be checked often. This is done to find early signs of a transfusion reaction. Tell your nurse right away if you have any of these symptoms: Shortness of breath or trouble breathing. Chest or back pain. Fever or chills. Red, swollen areas of skin or itching. If you have any signs or symptoms of a reaction, your  transfusion will be stopped. You may also be given medicine. When the transfusion is finished, your IV tube will be taken out. Pressure may be put on the IV site for a few minutes. A bandage (dressing) will be put on the IV site. The procedure may vary among doctors and hospitals. What happens after the procedure? You will be monitored until you leave the hospital or clinic. This includes checking your temperature, blood pressure, pulse, breathing rate, and blood oxygen level. Your blood may be tested to see how you are responding to the transfusion. You may be warmed with fluids or blankets. This is done to keep the temperature of your body normal. If you have your procedure in an outpatient setting, you will be told whom to contact to report any reactions. Where to find more information To learn more, visit the [employer]: redcross.org Summary A blood transfusion is a procedure in which you are given blood through an IV tube. The blood may come from someone else (a donor). You may also be able to donate blood for yourself. The blood you are given is made up of different blood cells. You may receive red blood cells, platelets, plasma, or white blood cells. Your temperature, blood pressure, and pulse will be checked often. After the procedure, your blood may be tested to see how you are responding. This information is not intended to replace advice given to you by your health care provider. Make sure you discuss any questions you have with your health care provider. Document Revised: 05/27/2019 Document Reviewed: 05/27/2019 Elsevier Patient Education  2023 [employer]  

## 2022-06-01 LAB — TYPE AND SCREEN
ABO/RH(D): O POS
Antibody Screen: NEGATIVE
Unit division: 0
Unit division: 0

## 2022-06-01 LAB — BPAM RBC
Blood Product Expiration Date: 202307142359
Blood Product Expiration Date: 202307142359
ISSUE DATE / TIME: 202306160733
ISSUE DATE / TIME: 202306160733
Unit Type and Rh: 5100
Unit Type and Rh: 5100

## 2022-06-05 NOTE — H&P (Signed)
Chief Complaint: Patient was seen in consultation today for microcytic anemia/bone lesions at the request of Ennever,Peter R  Referring Physician(s): Ennever,Peter R  Supervising Physician: Irish Lack  Patient Status: [name] - Out-pt  History of Present Illness: [name] is a 66 y.o. female with past medical history of persistent abdominal pain, erythropoietin deficiency anemia, HLD and HTN.  Patient had recent admission to the hospital for diarrhea and weakness x4 days.  She was found at that time to have mild thrombocytopenia.  Patient underwent CT of the abdomen and was found to have multiple sclerotic foci throughout the marrow space.  CT chest showed again numerous sclerotic lesions.  Patient was referred by Dr. Arlan Organ for bone marrow biopsy to rule out myelodysplasia.  CT abdomen pelvis 05/23/2022:  Musculoskeletal: Development of innumerable tiny sclerotic lesions throughout the pelvis and thoracolumbar spine. Trace L4-5 anterolisthesis.  IMPRESSION: 1. Development of multiple sclerotic foci throughout the marrow space, highly suspicious for metastatic disease. Correlate with primary malignancy history and consider further evaluation with PET. 2. Given limitations of lack of oral or IV contrast, no acute process in the abdomen or pelvis. 3. Small volume free pelvic fluid, abnormal for age but nonspecific. 4. Hypoattenuation and volume loss within the spleen, new since 2017 and possibly related to small volume infarct. No perisplenic fluid to confirm acute or subacute time course. 5. Coronary artery atherosclerosis. Aortic Atherosclerosis (ICD10-I70.0).  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   Hyperlipidemia    Hypertension     Past Surgical History:  Procedure Laterality Date   CHOLECYSTECTOMY N/A 05/21/2016   Procedure:  LAPAROSCOPIC CHOLECYSTECTOMY;  Surgeon: Harriette Bouillon, MD;  Location: [facility] OR;  Service: General;  Laterality: N/A;   COLONOSCOPY     GIVENS CAPSULE STUDY N/A 07/22/2016   Procedure: GIVENS CAPSULE STUDY;  Surgeon: Jeani Hawking, MD;  Location: [facility] ENDOSCOPY;  Service: Endoscopy;  Laterality: N/A;    Allergies: Isovue [iopamidol] and Iodine-131  Medications: Prior to Admission medications   Medication Sig Start Date End Date Taking? Authorizing Provider  amLODipine (NORVASC) 10 MG tablet Take 1 tablet (10 mg total) by mouth daily. Patient not taking: Reported on 05/30/2022 05/26/22   Calvert Cantor, MD  atorvastatin (LIPITOR) 40 MG tablet Take 1 tablet (40 mg total) by mouth daily. Patient not taking: Reported on 05/30/2022 05/26/22   Calvert Cantor, MD  Calcium-Phosphorus-Vitamin D (CALCIUM GUMMIES) 250-100-500 MG-MG-UNIT CHEW Chew 1 tablet by mouth daily. Patient not taking: Reported on 05/30/2022    [provider]  ECHINACEA PO Take 1 capsule by mouth daily. Patient not taking: Reported on 05/30/2022    [provider]  Elderberry 500 MG CAPS Take 500 mg by mouth daily. Patient not taking: Reported on 05/30/2022    [provider]  Fe Bisgly-Vit C-Vit B12-FA (GENTLE IRON PO) Take 1 capsule by mouth daily. Patient not taking: Reported on 05/30/2022    [provider]  feeding supplement (ENSURE ENLIVE / ENSURE PLUS) LIQD Take 237 mLs by mouth 2 (two) times daily between meals. Patient not taking: Reported on 05/30/2022 05/26/22   Calvert Cantor, MD  Flaxseed, Linseed, (FLAXSEED OIL) 1200 MG CAPS Take 1,200 mg by mouth daily. Patient not taking: Reported on 05/30/2022    [provider]  folic acid (FOLVITE) 400 MCG tablet Take 1 tablet (400 mcg total) by mouth daily. 05/26/22 06/25/22  Debbe Odea, MD  Iron-Vitamins (GERITOL PO) Take 1 tablet by mouth daily.    [provider]  lisinopril (ZESTRIL) 20 MG tablet Take 1 tablet (20 mg total) by mouth  daily. Patient not taking: Reported on 05/30/2022 05/26/22   Debbe Odea, MD  MAGNESIUM GLYCINATE PO Take 400 mg by mouth daily. Patient not taking: Reported on 05/30/2022    [provider]  Multiple Vitamin (MULTIVITAMIN WITH MINERALS) TABS tablet Take 1 tablet by mouth daily. Patient not taking: Reported on 05/30/2022 05/26/22   Debbe Odea, MD  P-Aminobenzoic Acid (PABA PO) Take 1 tablet by mouth daily. PABA 700 Patient not taking: Reported on 05/30/2022    [provider]  Potassium 99 MG TABS Take 99 mg by mouth daily. Patient not taking: Reported on 05/30/2022    [provider]     Family History  Problem Relation Age of Onset   Stroke Mother    Hyperlipidemia Mother    Diabetes Mother     Social History   Socioeconomic History   Marital status: Divorced    Spouse name: Not on file   Number of children: Not on file   Years of education: Not on file   Highest education level: Not on file  Occupational History   Not on file  Tobacco Use   Smoking status: Never   Smokeless tobacco: Never  Vaping Use   Vaping Use: Never used  Substance and Sexual Activity   Alcohol use: No    Alcohol/week: 0.0 standard drinks of alcohol   Drug use: No   Sexual activity: Not Currently  Other Topics Concern   Not on file  Social History Narrative   Not on file   Social Determinants of Health   Financial Resource Strain: Not on file  Food Insecurity: Not on file  Transportation Needs: Not on file  Physical Activity: Not on file  Stress: Not on file  Social Connections: Not on file    Review of Systems: A 12 point ROS discussed and pertinent positives are indicated in the HPI above.  All other systems are negative.  Review of Systems  All other systems reviewed and are negative.   Vital Signs: There were no vitals taken for this visit.   Physical Exam Vitals reviewed.  Constitutional:      General: She is not in acute distress.    Appearance:  Normal appearance. She is not ill-appearing.  HENT:     Head: Normocephalic and atraumatic.     Mouth/Throat:     Mouth: Mucous membranes are dry.     Pharynx: Oropharynx is clear.  Eyes:     Extraocular Movements: Extraocular movements intact.     Pupils: Pupils are equal, round, and reactive to light.  Cardiovascular:     Rate and Rhythm: Normal rate and regular rhythm.     Pulses: Normal pulses.     Heart sounds: Normal heart sounds.  Pulmonary:     Effort: Pulmonary effort is normal. No respiratory distress.     Breath sounds: Normal breath sounds.  Abdominal:     General: Bowel sounds are normal. There is no distension.     Palpations: Abdomen is soft.     Tenderness: There is no abdominal tenderness. There is no guarding.  Musculoskeletal:     Right lower leg: No edema.     Left lower leg: No edema.  Skin:    General: Skin is warm and dry.  Neurological:  Mental Status: She is alert and oriented to person, place, and time.  Psychiatric:        Mood and Affect: Mood normal.        Behavior: Behavior normal.        Thought Content: Thought content normal.        Judgment: Judgment normal.     Imaging: US RENAL  Result Date: 05/25/2022 CLINICAL DATA:  Dehydration EXAM: RENAL / URINARY TRACT ULTRASOUND COMPLETE COMPARISON:  None Available. FINDINGS: Right Kidney: Renal measurements: 7.2 x 3.3 x 4.4 cm = volume: 55 mL. Echogenicity within normal limits. No mass or hydronephrosis visualized. Left Kidney: Renal measurements: 9.6 x 3.8 x 4.8 cm = volume: 92 mL. Echogenicity within normal limits. No mass or hydronephrosis visualized. Bladder: Appears normal for degree of bladder distention. Other: None. IMPRESSION: 1. Normal kidneys.  No hydronephrosis. 2. Normal bladder Electronically Signed   By: Suzy Bouchard M.D.   On: 05/25/2022 18:30   CT Chest Wo Contrast  Result Date: 05/23/2022 CLINICAL DATA:  Pneumonia. EXAM: CT CHEST WITHOUT CONTRAST TECHNIQUE: Multidetector CT  imaging of the chest was performed following the standard protocol without IV contrast. RADIATION DOSE REDUCTION: This exam was performed according to the departmental dose-optimization program which includes automated exposure control, adjustment of the mA and/or kV according to patient size and/or use of iterative reconstruction technique. COMPARISON:  Chest x-ray same day FINDINGS: Cardiovascular: No significant vascular findings. Normal heart size. No pericardial effusion. There are atherosclerotic calcifications of the aorta and coronary arteries. Mediastinum/Nodes: No enlarged mediastinal or axillary lymph nodes. There are few tiny foci of air inferior to the carina, indeterminate. There is likely a tracheal or esophageal diverticulum on the right at the level of the thoracic inlet measuring 8 x 13 by 11 mm. Thyroid gland, trachea, and esophagus demonstrate no significant findings. Lungs/Pleura: There are minimal patchy ground-glass opacities in the right lower lobe/lung base. The lungs are otherwise clear. There is no pleural effusion or pneumothorax. Upper Abdomen: Cholecystectomy clips are present. Musculoskeletal: There are innumerable sclerotic osseous lesions seen throughout the spine measuring less than 1 cm. No acute fractures are seen. IMPRESSION: 1. Minimal patchy ground-glass opacities in the right lower lobe, likely infectious/inflammatory. 2. Small amount of air inferior to the carina may represent pneumomediastinum versus small tracheal diverticula. 3. 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 4. Numerous sclerotic osseous lesions worrisome for metastatic disease. Electronically Signed   By: Ronney Asters M.D.   On: 05/23/2022 21:22   CT Head Wo Contrast  Result Date: 05/23/2022 CLINICAL DATA:  Mental status change, unknown cause EXAM: CT HEAD WITHOUT CONTRAST TECHNIQUE: Contiguous axial images were obtained from the base of the skull through the  vertex without intravenous contrast. RADIATION DOSE REDUCTION: This exam was performed according to the departmental dose-optimization program which includes automated exposure control, adjustment of the mA and/or kV according to patient size and/or use of iterative reconstruction technique. COMPARISON:  None Available. FINDINGS: Brain: No evidence of acute intracranial hemorrhage or extra-axial collection.No evidence of mass lesion/concerning mass effect.The ventricles are normal in size. Vascular: No hyperdense vessel or unexpected calcification. Skull: Normal. Negative for fracture or focal lesion. Sinuses/Orbits: Mild mucosal thickening of the maxillary sinuses. Other: None. IMPRESSION: No acute intracranial abnormality. Electronically Signed   By: Maurine Simmering M.D.   On: 05/23/2022 18:47   CT Abdomen Pelvis Wo Contrast  Result Date: 05/23/2022 CLINICAL DATA:  Nonlocalized abdominal pain. EXAM: CT ABDOMEN  AND PELVIS WITHOUT CONTRAST TECHNIQUE: Multidetector CT imaging of the abdomen and pelvis was performed following the standard protocol without IV contrast. RADIATION DOSE REDUCTION: This exam was performed according to the departmental dose-optimization program which includes automated exposure control, adjustment of the mA and/or kV according to patient size and/or use of iterative reconstruction technique. COMPARISON:  05/12/2016 FINDINGS: Lower chest: Clear lung bases. Normal heart size without pericardial or pleural effusion. Right coronary artery calcification. Hepatobiliary: Normal noncontrast appearance of the liver. Cholecystectomy, without biliary ductal dilatation. Pancreas: Normal, without mass or ductal dilatation. Spleen: New subtle splenic volume loss and hypoattenuation, including on 11/02. No perisplenic fluid. Adrenals/Urinary Tract: Normal adrenal glands. No renal calculi or hydronephrosis. No hydroureter or ureteric calculi. No bladder calculi. Stomach/Bowel: Normal stomach, without wall  thickening. Normal colon and terminal ileum. Appendix not visualized. Normal small bowel. Vascular/Lymphatic: Aortic atherosclerosis. No abdominopelvic adenopathy. Reproductive: Normal uterus and adnexa. Other: No free intraperitoneal air. Small volume pelvic fluid including on 54/2 is nonspecific. Musculoskeletal: Development of innumerable tiny sclerotic lesions throughout the pelvis and thoracolumbar spine. Trace L4-5 anterolisthesis. IMPRESSION: 1. Development of multiple sclerotic foci throughout the marrow space, highly suspicious for metastatic disease. Correlate with primary malignancy history and consider further evaluation with PET. 2. Given limitations of lack of oral or IV contrast, no acute process in the abdomen or pelvis. 3. Small volume free pelvic fluid, abnormal for age but nonspecific. 4. Hypoattenuation and volume loss within the spleen, new since 2017 and possibly related to small volume infarct. No perisplenic fluid to confirm acute or subacute time course. 5. Coronary artery atherosclerosis. Aortic Atherosclerosis (ICD10-I70.0). Electronically Signed   By: Abigail Miyamoto M.D.   On: 05/23/2022 18:11   DG Chest Port 1 View  Result Date: 05/23/2022 CLINICAL DATA:  AMS EXAM: PORTABLE CHEST 1 VIEW COMPARISON:  None Available. FINDINGS: The heart size and mediastinal contours are within normal limits. Both lungs are clear. No focal consolidation, pleural effusion or vascular congestion. The visualized skeletal structures are unremarkable. IMPRESSION: No active disease. Electronically Signed   By: Frazier Richards M.D.   On: 05/23/2022 18:02    Labs:  CBC: Recent Labs    05/23/22 1714 05/24/22 0547 05/26/22 0653 05/30/22 1100  WBC 7.9 4.6 4.5 4.0  HGB 12.4 9.8* 8.5* 7.5*  HCT 39.2 30.5* 27.4* 24.1*  PLT 126* 110* 167 283    COAGS: No results for input(s): "INR", "APTT" in the last 8760 hours.  BMP: Recent Labs    05/24/22 1559 05/25/22 1559 05/26/22 0653 05/30/22 1100  NA  133* 141 141 140  K 3.5 4.6 4.2 4.2  CL 101 115* 115* 108  CO2 23 19* 23 26  GLUCOSE 111* 73 81 75  BUN $Re'18 14 13 20  'wUR$ CALCIUM 8.0* 8.2* 8.5* 9.2  CREATININE 1.86* 2.07* 1.89* 1.99*  GFRNONAA 29* 26* 29* 27*    LIVER FUNCTION TESTS: Recent Labs    05/23/22 1714 05/24/22 0547 05/30/22 1100  BILITOT 1.3* 1.0 0.5  AST 68* 55* 31  ALT $Re'13 13 7  'bBm$ ALKPHOS 66 49 41  PROT 8.6* 6.4* 6.5  ALBUMIN 4.3 3.2* 3.5    TUMOR MARKERS: No results for input(s): "AFPTM", "CEA", "CA199", "CHROMGRNA" in the last 8760 hours.  Assessment and Plan: History of persistent abdominal pain, erythropoietin deficiency anemia, HLD and HTN.  Patient had recent admission to the hospital for diarrhea and weakness x4 days.  She was found at that time to have mild thrombocytopenia.  Patient underwent CT  of the abdomen and was found to have multiple sclerotic foci throughout the marrow space.  CT chest showed again numerous sclerotic lesions.  Patient was referred by Dr. Burney Gauze for bone marrow biopsy to rule out myelodysplasia.  CT abdomen pelvis 05/23/2022:  Musculoskeletal: Development of innumerable tiny sclerotic lesions throughout the pelvis and thoracolumbar spine. Trace L4-5 anterolisthesis.  IMPRESSION: 1. Development of multiple sclerotic foci throughout the marrow space, highly suspicious for metastatic disease. Correlate with primary malignancy history and consider further evaluation with PET. 2. Given limitations of lack of oral or IV contrast, no acute process in the abdomen or pelvis. 3. Small volume free pelvic fluid, abnormal for age but nonspecific. 4. Hypoattenuation and volume loss within the spleen, new since 2017 and possibly related to small volume infarct. No perisplenic fluid to confirm acute or subacute time course. 5. Coronary artery atherosclerosis. Aortic Atherosclerosis (ICD10-I70.0).  Risks and benefits of bone marrow biopsy and aspiration with moderate sedation was discussed  with the patient and/or patient's family including, but not limited to bleeding, infection, damage to adjacent structures or low yield requiring additional tests.  All of the questions were answered and there is agreement to proceed.  Consent signed and in chart.   Thank you for this interesting consult.  I greatly enjoyed meeting Yuette Putnam and look forward to participating in their care.  A copy of this report was sent to the requesting provider on this date.  Electronically Signed: Tyson Alias, NP 06/06/2022, 11:05 AM   I spent a total of 20 minutes in face to face in clinical consultation, greater than 50% of which was counseling/coordinating care for microcytic anemia/bone lesions.

## 2022-06-06 ENCOUNTER — Ambulatory Visit (HOSPITAL_COMMUNITY)
Admission: RE | Admit: 2022-06-06 | Discharge: 2022-06-06 | Disposition: A | Discharge status: Home / Self Care | Payer: 59 | Source: Ambulatory Visit | Attending: Hematology & Oncology | Admitting: Hematology & Oncology

## 2022-06-06 ENCOUNTER — Encounter: Payer: Self-pay | Admitting: *Deleted

## 2022-06-06 ENCOUNTER — Encounter (HOSPITAL_COMMUNITY): Payer: Self-pay

## 2022-06-06 ENCOUNTER — Other Ambulatory Visit: Payer: Self-pay | Admitting: Internal Medicine

## 2022-06-06 DIAGNOSIS — I2510 Atherosclerotic heart disease of native coronary artery without angina pectoris: Secondary | ICD-10-CM | POA: Diagnosis not present

## 2022-06-06 DIAGNOSIS — M4316 Spondylolisthesis, lumbar region: Secondary | ICD-10-CM | POA: Insufficient documentation

## 2022-06-06 DIAGNOSIS — D696 Thrombocytopenia, unspecified: Secondary | ICD-10-CM | POA: Diagnosis present

## 2022-06-06 DIAGNOSIS — M899 Disorder of bone, unspecified: Secondary | ICD-10-CM | POA: Diagnosis not present

## 2022-06-06 DIAGNOSIS — E785 Hyperlipidemia, unspecified: Secondary | ICD-10-CM | POA: Diagnosis not present

## 2022-06-06 DIAGNOSIS — I700 Atherosclerosis of aorta: Secondary | ICD-10-CM | POA: Diagnosis not present

## 2022-06-06 DIAGNOSIS — D649 Anemia, unspecified: Secondary | ICD-10-CM | POA: Diagnosis not present

## 2022-06-06 DIAGNOSIS — R109 Unspecified abdominal pain: Secondary | ICD-10-CM | POA: Insufficient documentation

## 2022-06-06 DIAGNOSIS — I10 Essential (primary) hypertension: Secondary | ICD-10-CM | POA: Insufficient documentation

## 2022-06-06 DIAGNOSIS — C7951 Secondary malignant neoplasm of bone: Secondary | ICD-10-CM

## 2022-06-06 LAB — CBC WITH DIFFERENTIAL/PLATELET
Abs Immature Granulocytes: 0.02 10*3/uL (ref 0.00–0.07)
Basophils Absolute: 0.1 10*3/uL (ref 0.0–0.1)
Basophils Relative: 1 %
Eosinophils Absolute: 0.1 10*3/uL (ref 0.0–0.5)
Eosinophils Relative: 2 %
HCT: 35.6 % — ABNORMAL LOW (ref 36.0–46.0)
Hemoglobin: 11.4 g/dL — ABNORMAL LOW (ref 12.0–15.0)
Immature Granulocytes: 0 %
Lymphocytes Relative: 30 %
Lymphs Abs: 2.2 10*3/uL (ref 0.7–4.0)
MCH: 26.1 pg (ref 26.0–34.0)
MCHC: 32 g/dL (ref 30.0–36.0)
MCV: 81.7 fL (ref 80.0–100.0)
Monocytes Absolute: 0.8 10*3/uL (ref 0.1–1.0)
Monocytes Relative: 11 %
Neutro Abs: 4.2 10*3/uL (ref 1.7–7.7)
Neutrophils Relative %: 56 %
Platelets: 236 10*3/uL (ref 150–400)
RBC: 4.36 MIL/uL (ref 3.87–5.11)
RDW: 17.7 % — ABNORMAL HIGH (ref 11.5–15.5)
WBC: 7.4 10*3/uL (ref 4.0–10.5)
nRBC: 0 % (ref 0.0–0.2)

## 2022-06-06 MED ORDER — SODIUM CHLORIDE 0.9 % IV SOLN: INTRAVENOUS | Status: DC

## 2022-06-06 MED ORDER — MIDAZOLAM HCL 2 MG/2ML IJ SOLN
INTRAMUSCULAR | Status: AC
  Filled 2022-06-06: qty 2

## 2022-06-06 MED ORDER — NALOXONE HCL 0.4 MG/ML IJ SOLN
INTRAMUSCULAR | Status: AC
  Filled 2022-06-06: qty 1

## 2022-06-06 MED ORDER — FENTANYL CITRATE (PF) 100 MCG/2ML IJ SOLN
INTRAMUSCULAR | Status: AC
  Filled 2022-06-06: qty 2

## 2022-06-06 MED ORDER — FENTANYL CITRATE (PF) 100 MCG/2ML IJ SOLN
INTRAMUSCULAR | Status: AC | PRN
  Administered 2022-06-06 (×2): 25 ug via INTRAVENOUS

## 2022-06-06 MED ORDER — FLUMAZENIL 0.5 MG/5ML IV SOLN
INTRAVENOUS | Status: AC
  Filled 2022-06-06: qty 5

## 2022-06-06 MED ORDER — MIDAZOLAM HCL 2 MG/2ML IJ SOLN
INTRAMUSCULAR | Status: AC | PRN
  Administered 2022-06-06: .5 mg via INTRAVENOUS
  Administered 2022-06-06: 1 mg via INTRAVENOUS

## 2022-06-06 NOTE — Progress Notes (Signed)
Patient had her BMBx. Will follow for path results.   Oncology Nurse Navigator Documentation     06/06/2022   12:00 PM  Oncology Nurse Navigator Flowsheets  Navigator Follow Up Date: 06/11/2022  Navigator Follow Up Reason: Pathology  Navigator Location [facility]  Navigator Encounter Type Appt/Treatment Plan Review  Patient Visit Type MedOnc  Treatment Phase Abnormal Scans  Barriers/Navigation Needs Coordination of Care;Education  Interventions None Required  Acuity Level 2-Minimal Needs (1-2 Barriers Identified)  Support Groups/Services Friends and Family  Time Spent with Patient 15

## 2022-06-06 NOTE — Procedures (Signed)
Interventional Radiology Procedure Note  Procedure: CT guided bone marrow aspiration and biopsy  Complications: None  EBL: < 10 mL  Findings: Aspirate and core biopsy performed of bone marrow in right iliac bone.  Plan: Bedrest supine x 1 hrs  Jake Fuhrmann T. Ludell Zacarias, M.D Pager:  [phone]   

## 2022-06-10 ENCOUNTER — Ambulatory Visit (INDEPENDENT_AMBULATORY_CARE_PROVIDER_SITE_OTHER): Payer: 59 | Admitting: Sports Medicine

## 2022-06-10 ENCOUNTER — Encounter: Payer: Self-pay | Admitting: Sports Medicine

## 2022-06-10 DIAGNOSIS — Z0000 Encounter for general adult medical examination without abnormal findings: Secondary | ICD-10-CM

## 2022-06-10 DIAGNOSIS — C7951 Secondary malignant neoplasm of bone: Secondary | ICD-10-CM

## 2022-06-10 NOTE — Assessment & Plan Note (Signed)
 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 She has been evaluated by oncology, she had a bone marrow biopsy recently, I have checked and surgical pathology results are not back yet. She also was significantly anemic and just had a transfusion. Overall feels better. I advised her my role here would be to help her navigate the system and navigate results, primary treatment would be with her oncology providers. We will keep her up-to-date on other screening measures in the meantime, I would also like to help her with her energy levels, I did however suspect that her transfusion will help. On further questioning there is potentially some hoarding in the house, and she does have plans to clean out her garage, she and her son will work on this and hopefully will be done by the time she comes back in a month. She is really not having much pain, and her mood is fairly good all things considered. We will follow her oncologic processes from afar.

## 2022-06-10 NOTE — Assessment & Plan Note (Signed)
Please see below, at the follow-up visit we will further discuss routine screenings, but I would like to have some idea of where the current malignancy is headed first.

## 2022-06-10 NOTE — Progress Notes (Signed)
    Procedures performed today:    None.  Independent interpretation of notes and tests performed by another provider:   None.  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 She has been evaluated by oncology, she had a bone marrow biopsy recently, I have checked and surgical pathology results are not back yet. She also was significantly anemic and just had a transfusion. Overall feels better. I advised her my role here would be to help her navigate the system and navigate results, primary treatment would be with her oncology providers. We will keep her up-to-date on other screening measures in the meantime, I would also like to help her with her energy levels, I did however suspect that her transfusion will help. On further questioning there is potentially some hoarding in the house, and she does have plans to clean out her garage, she and her son will work on this and hopefully will be done by the time she comes back in a month. She is really not having much pain, and her mood is fairly good all things considered. We will follow her oncologic processes from afar.  Annual physical exam Please see below, at the follow-up visit we will further discuss routine screenings, but I would like to have some idea of where the current malignancy is headed first.    ___________________________________________ [name], M.D., ABFM., CAQSM. Primary Care and Sports Medicine [facility]  Adjunct Instructor of Family Medicine  [employer]

## 2022-06-11 ENCOUNTER — Telehealth: Payer: Self-pay | Admitting: Dietician

## 2022-06-11 LAB — SURGICAL PATHOLOGY

## 2022-06-11 NOTE — Telephone Encounter (Signed)
Patient screened on MST. Second  attempt to reach. Provided my cell# on voice mail to return call to set up a nutrition consult.  Weight has increased past 2 weeks.   Anthropometrics:    Height: 61" Weight:   06/10/22   123# 05/30/22    118# 05/23/22    108.25# 09/16/18    127 UBW: 125-135 BMI: 23.24   Cyndi Kyros Salzwedel, RDN, LDN Registered Dietitian, [facility] Part Time Remote (Usual office hours: Tuesday-Thursday) Cell: [phone]

## 2022-06-12 ENCOUNTER — Encounter: Payer: Self-pay | Admitting: *Deleted

## 2022-06-12 NOTE — Progress Notes (Signed)
Reviewed BM path with Dr Marin Olp. No diagnostic findings. Patient's PET is scheduled for 06/17/2022. No further needs at this time.   Called patient, and spoke to her son. Notified him of her of results. Explained that we will be back in touch once PET results are received.   Oncology Nurse Navigator Documentation     06/12/2022    9:15 AM  Oncology Nurse Navigator Flowsheets  Navigator Follow Up Date: 06/17/2022  Navigator Follow Up Reason: Scan Review  Navigator Location [facility]  Navigator Encounter Type Pathology Review;Telephone  Telephone Diagnostic Results;Outgoing Call  Patient Visit Type MedOnc  Treatment Phase Abnormal Scans  Barriers/Navigation Needs Coordination of Care;Education  Education Other  Interventions Education;Psycho-Social Support  Acuity Level 2-Minimal Needs (1-2 Barriers Identified)  Education Method Verbal  Support Groups/Services Friends and Family  Time Spent with Patient 15

## 2022-06-17 ENCOUNTER — Ambulatory Visit (HOSPITAL_COMMUNITY): Admission: RE | Admit: 2022-06-17 | Payer: 59 | Source: Ambulatory Visit

## 2022-06-17 ENCOUNTER — Encounter: Payer: Self-pay | Admitting: *Deleted

## 2022-06-17 NOTE — Progress Notes (Signed)
Patient was scheduled for her PET scan this morning, but was a no-show. Called and spoke to her son, Billee Cashing, who had assumed that she went to the appointment. He stated he would go to her home and check on her and asked that I call him back in about 30 minutes.   Called Quince again and he states his mom just sat down after working third shift and fell asleep. They will call to get PET rescheduled ASAP.   Oncology Nurse Navigator Documentation     06/17/2022   10:15 AM  Oncology Nurse Navigator Flowsheets  Navigator Follow Up Date: 06/24/2022  Navigator Follow Up Reason: Scan Review  Navigator Location [facility]  Navigator Encounter Type Appt/Treatment Plan Review;Telephone  Telephone Education;Outgoing Call  Patient Visit Type MedOnc  Treatment Phase Abnormal Scans  Barriers/Navigation Needs Coordination of Care;Education  Education Other  Interventions Coordination of Care;Education;Psycho-Social Support  Acuity Level 2-Minimal Needs (1-2 Barriers Identified)  Coordination of Care Radiology  Education Method Verbal;Teach-back  Support Groups/Services Friends and Family  Time Spent with Patient 15

## 2022-06-19 ENCOUNTER — Encounter (HOSPITAL_COMMUNITY): Payer: Self-pay | Admitting: Hematology & Oncology

## 2022-06-24 ENCOUNTER — Encounter (HOSPITAL_COMMUNITY)
Admission: RE | Admit: 2022-06-24 | Discharge: 2022-06-24 | Disposition: A | Discharge status: Home / Self Care | Payer: 59 | Source: Ambulatory Visit | Attending: Hematology & Oncology | Admitting: Hematology & Oncology

## 2022-06-24 DIAGNOSIS — C7951 Secondary malignant neoplasm of bone: Secondary | ICD-10-CM | POA: Diagnosis not present

## 2022-06-24 LAB — GLUCOSE, CAPILLARY: Glucose-Capillary: 78 mg/dL (ref 70–99)

## 2022-06-24 MED ORDER — FLUDEOXYGLUCOSE F - 18 (FDG) INJECTION
6.1200 | Freq: Once | INTRAVENOUS | Status: AC | PRN
Start: 2022-06-24 — End: 2022-06-24
  Administered 2022-06-24: 6.12 via INTRAVENOUS

## 2022-06-26 ENCOUNTER — Encounter: Payer: Self-pay | Admitting: *Deleted

## 2022-06-26 NOTE — Progress Notes (Signed)
Reviewed PET scan results.   Oncology Nurse Navigator Documentation     06/26/2022    8:30 AM  Oncology Nurse Navigator Flowsheets  Navigator Follow Up Date: 07/02/2022  Navigator Follow Up Reason: Follow-up Appointment  Navigator Location [facility]  Navigator Encounter Type Scan Review  Patient Visit Type MedOnc  Treatment Phase Abnormal Scans  Barriers/Navigation Needs Coordination of Care;Education  Interventions None Required  Acuity Level 2-Minimal Needs (1-2 Barriers Identified)  Support Groups/Services Friends and Family  Time Spent with Patient 15

## 2022-07-02 ENCOUNTER — Encounter: Payer: Self-pay | Admitting: *Deleted

## 2022-07-02 ENCOUNTER — Encounter: Payer: Self-pay | Admitting: Hematology & Oncology

## 2022-07-02 ENCOUNTER — Other Ambulatory Visit: Payer: Self-pay

## 2022-07-02 ENCOUNTER — Inpatient Hospital Stay: Payer: 59 | Attending: Hematology & Oncology

## 2022-07-02 ENCOUNTER — Inpatient Hospital Stay (HOSPITAL_BASED_OUTPATIENT_CLINIC_OR_DEPARTMENT_OTHER): Payer: 59 | Admitting: Hematology & Oncology

## 2022-07-02 VITALS — BP 152/93 | HR 59 | Temp 98.2°F | Resp 16 | Wt 119.0 lb

## 2022-07-02 DIAGNOSIS — C7951 Secondary malignant neoplasm of bone: Secondary | ICD-10-CM

## 2022-07-02 DIAGNOSIS — M899 Disorder of bone, unspecified: Secondary | ICD-10-CM | POA: Diagnosis not present

## 2022-07-02 DIAGNOSIS — D631 Anemia in chronic kidney disease: Secondary | ICD-10-CM | POA: Diagnosis not present

## 2022-07-02 LAB — CBC WITH DIFFERENTIAL (CANCER CENTER ONLY)
Abs Immature Granulocytes: 0.02 10*3/uL (ref 0.00–0.07)
Basophils Absolute: 0.1 10*3/uL (ref 0.0–0.1)
Basophils Relative: 1 %
Eosinophils Absolute: 0.1 10*3/uL (ref 0.0–0.5)
Eosinophils Relative: 3 %
HCT: 31.7 % — ABNORMAL LOW (ref 36.0–46.0)
Hemoglobin: 10 g/dL — ABNORMAL LOW (ref 12.0–15.0)
Immature Granulocytes: 0 %
Lymphocytes Relative: 37 %
Lymphs Abs: 1.8 10*3/uL (ref 0.7–4.0)
MCH: 25.3 pg — ABNORMAL LOW (ref 26.0–34.0)
MCHC: 31.5 g/dL (ref 30.0–36.0)
MCV: 80.1 fL (ref 80.0–100.0)
Monocytes Absolute: 0.3 10*3/uL (ref 0.1–1.0)
Monocytes Relative: 7 %
Neutro Abs: 2.6 10*3/uL (ref 1.7–7.7)
Neutrophils Relative %: 52 %
Platelet Count: 154 10*3/uL (ref 150–400)
RBC: 3.96 MIL/uL (ref 3.87–5.11)
RDW: 15.4 % (ref 11.5–15.5)
WBC Count: 5 10*3/uL (ref 4.0–10.5)
nRBC: 0 % (ref 0.0–0.2)

## 2022-07-02 LAB — SAMPLE TO BLOOD BANK

## 2022-07-02 LAB — CMP (CANCER CENTER ONLY)
ALT: 25 U/L (ref 0–44)
AST: 58 U/L — ABNORMAL HIGH (ref 15–41)
Albumin: 4.3 g/dL (ref 3.5–5.0)
Alkaline Phosphatase: 59 U/L (ref 38–126)
Anion gap: 7 (ref 5–15)
BUN: 25 mg/dL — ABNORMAL HIGH (ref 8–23)
CO2: 30 mmol/L (ref 22–32)
Calcium: 9.7 mg/dL (ref 8.9–10.3)
Chloride: 102 mmol/L (ref 98–111)
Creatinine: 1.4 mg/dL — ABNORMAL HIGH (ref 0.44–1.00)
GFR, Estimated: 41 mL/min — ABNORMAL LOW (ref >60)
Glucose, Bld: 78 mg/dL (ref 70–99)
Potassium: 3.7 mmol/L (ref 3.5–5.1)
Sodium: 139 mmol/L (ref 135–145)
Total Bilirubin: 0.4 mg/dL (ref 0.3–1.2)
Total Protein: 7.6 g/dL (ref 6.5–8.1)

## 2022-07-02 LAB — RETICULOCYTES
Immature Retic Fract: 10.4 % (ref 2.3–15.9)
RBC.: 3.94 MIL/uL (ref 3.87–5.11)
Retic Count, Absolute: 20.1 10*3/uL (ref 19.0–186.0)
Retic Ct Pct: 0.5 % (ref 0.4–3.1)

## 2022-07-03 ENCOUNTER — Encounter: Payer: Self-pay | Admitting: Family

## 2022-07-03 NOTE — Progress Notes (Signed)
Hematology and Oncology Follow Up Visit  Deliliah Spranger [id number] [birth year] 66 y.o. 07/03/2022   Principle Diagnosis:  Spinal lesions-etiology unknown  Current Therapy:   Observation     Interim History:  Ms. Papaleo is back for her second office visit.  We first saw her back in mid June.  At that time, she came in because she had a CT scan that showed sclerotic lesions in the spine.  So far, her work-up has been totally unremarkable.  She has had a very aggressive work-up.  She had a PET scan that was done.  This was done on 06/24/2022.  This showed the sclerotic lesions that did not show any hyper metabolism.  Of course, it was felt that these lesions were worrisome for metastatic disease.  She did have a bone marrow biopsy done.  This was done on 06/06/2022.  The pathology report ([id number]) normal bone marrow.  There is no evidence of a hematologic malignancy.  There is no evidence of myeloma.  There is no lymphoid aggregates.  There was some evidence of iron overload.  We did myeloma panel on her.  This was unremarkable.  She had normal tumor markers for breast cancer in adenocarcinoma.  Her iron studies show an iron saturation of 36%.  She had normal vitamin B-12 level.  I will surprise that we have not been able to uncover any etiology for the spinal lesion.  Unfortunate, since we have in the radiology report the fact that this is worrisome for metastatic disease, we have to keep working and is trying to figure out what is going on.  I will get a bone scan on her.  We will see if this may help [place] out a little bit.  She has no pain.  She is eating well.  She is having no cough or shortness of breath.  She is having no change in bowel or bladder habits.  She has not had a mammogram in 4 years.  We will get her set up with 1.  She has had no rashes.  Is been no swollen lymph nodes.  Overall, I would say performance status is probably ECOG 1.    Medications:  Current  Outpatient Medications:    amLODipine (NORVASC) 10 MG tablet, Take 1 tablet (10 mg total) by mouth daily., Disp: 90 tablet, Rfl: 3   atorvastatin (LIPITOR) 40 MG tablet, Take 1 tablet (40 mg total) by mouth daily., Disp: 90 tablet, Rfl: 3   Calcium-Phosphorus-Vitamin D (CALCIUM GUMMIES) [phone] MG-MG-UNIT CHEW, Chew 1 tablet by mouth daily., Disp: , Rfl:    ECHINACEA PO, Take 1 capsule by mouth daily., Disp: , Rfl:    Elderberry 500 MG CAPS, Take 500 mg by mouth daily., Disp: , Rfl:    Fe Bisgly-Vit C-Vit B12-FA (GENTLE IRON PO), Take 1 capsule by mouth daily., Disp: , Rfl:    feeding supplement (ENSURE ENLIVE / ENSURE PLUS) LIQD, Take 237 mLs by mouth 2 (two) times daily between meals., Disp: 237 mL, Rfl: 12   Flaxseed, Linseed, (FLAXSEED OIL) 1200 MG CAPS, Take 1,200 mg by mouth daily., Disp: , Rfl:    Iron-Vitamins (GERITOL PO), Take 1 tablet by mouth daily., Disp: , Rfl:    lisinopril (ZESTRIL) 20 MG tablet, Take 1 tablet (20 mg total) by mouth daily., Disp: 90 tablet, Rfl: 3   MAGNESIUM GLYCINATE PO, Take 400 mg by mouth daily., Disp: , Rfl:    Multiple Vitamin (MULTIVITAMIN WITH MINERALS) TABS tablet, Take 1 tablet by  mouth daily., Disp: , Rfl:    P-Aminobenzoic Acid (PABA PO), Take 1 tablet by mouth daily. PABA 700, Disp: , Rfl:    Potassium 99 MG TABS, Take 99 mg by mouth daily., Disp: , Rfl:   Allergies:  Allergies  Allergen Reactions   Isovue [Iopamidol] Hives    Pt broke out in several facial hives, one on her back.  She will need full premeds in the future.  J Bohm No allergy demonstrated to  Orally ingested Iodinated agent. (Gastrographin CT Scan 05-12-16) Pt broke out in several facial hives, one on her back.  She will need full premeds in the future.  J Bohm   Iodine-131 Rash    Past Medical History, Surgical history, Social history, and Family History were reviewed and updated.  Review of Systems: Review of Systems  Constitutional: Negative.   HENT:  Negative.     Eyes: Negative.   Respiratory: Negative.    Cardiovascular: Negative.   Gastrointestinal: Negative.   Endocrine: Negative.   Genitourinary: Negative.    Musculoskeletal: Negative.   Skin: Negative.   Neurological: Negative.   Hematological: Negative.   Psychiatric/Behavioral: Negative.      Physical Exam:  weight is 119 lb (54 kg). Her oral temperature is 98.2 F (36.8 C). Her blood pressure is 152/93 (abnormal) and her pulse is 59 (abnormal). Her respiration is 16 and oxygen saturation is 100%.   Wt Readings from Last 3 Encounters:  07/02/22 119 lb (54 kg)  06/10/22 123 lb (55.8 kg)  05/30/22 118 lb 1.9 oz (53.6 kg)    Physical Exam Vitals reviewed.  HENT:     Head: Normocephalic and atraumatic.  Eyes:     Pupils: Pupils are equal, round, and reactive to light.  Cardiovascular:     Rate and Rhythm: Normal rate and regular rhythm.     Heart sounds: Normal heart sounds.  Pulmonary:     Effort: Pulmonary effort is normal.     Breath sounds: Normal breath sounds.  Abdominal:     General: Bowel sounds are normal.     Palpations: Abdomen is soft.  Musculoskeletal:        General: No tenderness or deformity. Normal range of motion.     Cervical back: Normal range of motion.  Lymphadenopathy:     Cervical: No cervical adenopathy.  Skin:    General: Skin is warm and dry.     Findings: No erythema or rash.  Neurological:     Mental Status: She is alert and oriented to person, place, and time.  Psychiatric:        Behavior: Behavior normal.        Thought Content: Thought content normal.        Judgment: Judgment normal.      Lab Results  Component Value Date   WBC 5.0 07/02/2022   HGB 10.0 (L) 07/02/2022   HCT 31.7 (L) 07/02/2022   MCV 80.1 07/02/2022   PLT 154 07/02/2022     Chemistry      Component Value Date/Time   NA 139 07/02/2022 1030   NA 139 11/28/2017 1450   NA 140 10/28/2016 1127   K 3.7 07/02/2022 1030   K 3.9 11/28/2017 1450   K 3.3 (L)  10/28/2016 1127   CL 102 07/02/2022 1030   CL 99 11/28/2017 1450   CO2 30 07/02/2022 1030   CO2 31 11/28/2017 1450   CO2 26 10/28/2016 1127   BUN 25 (H) 07/02/2022 1030  BUN 19 11/28/2017 1450   BUN 12.4 10/28/2016 1127   CREATININE 1.40 (H) 07/02/2022 1030   CREATININE 1.13 (H) 02/17/2018 1519   CREATININE 1.0 10/28/2016 1127      Component Value Date/Time   CALCIUM 9.7 07/02/2022 1030   CALCIUM 9.5 11/28/2017 1450   CALCIUM 9.6 10/28/2016 1127   ALKPHOS 59 07/02/2022 1030   ALKPHOS 83 11/28/2017 1450   ALKPHOS 88 10/28/2016 1127   AST 58 (H) 07/02/2022 1030   AST 32 10/28/2016 1127   ALT 25 07/02/2022 1030   ALT 19 11/28/2017 1450   ALT 9 10/28/2016 1127   BILITOT 0.4 07/02/2022 1030   BILITOT 0.55 10/28/2016 1127       Impression and Plan: Ms. Renovato is a very nice 65 year old Afro-American female.  She has the spinal lesions.  Again I am not sure exactly what is going on or what the etiology of this is.  We have not found any obvious malignancy.  Hopefully, she does not have any cancer.  We will get a bone scan on her.  We will see if the bone scan showed anything that might suggest a malignancy.  We may have to get her to an orthopedic oncologist to see if they need to do any kind of biopsy to figure out if there is any cancer.  Maybe this is a nonmalignant issue.  This could be Paget's disease.  She looks good.  She feels good.  I would like to hope that we are not looking at any malignancy.  We will get the bone scan and the mammogram set up for her.  We will get these in a couple weeks.  I will then plan to get her back and we will see how everything looks.  She is very nice.  I Eusebio Me talking with her.  She comes in with her son.     Volanda Napoleon, MD 7/19/20237:18 AM

## 2022-07-03 NOTE — Progress Notes (Signed)
Work up hasn't yet revealed a diagnosis. Dr Marin Olp has now ordered a bone scan and mammogram. Bone scan scheduled for 07/16/22.  Oncology Nurse Navigator Documentation     07/02/2022   11:15 AM  Oncology Nurse Navigator Flowsheets  Navigator Follow Up Date: 07/16/2022  Navigator Follow Up Reason: Scan Review  Navigator Location [facility]  Navigator Encounter Type Follow-up Appt  Patient Visit Type MedOnc  Treatment Phase Abnormal Scans  Barriers/Navigation Needs Coordination of Care;Education  Interventions None Required  Acuity Level 2-Minimal Needs (1-2 Barriers Identified)  Support Groups/Services Friends and Family  Time Spent with Patient 15

## 2022-07-08 ENCOUNTER — Encounter: Payer: Self-pay | Admitting: Sports Medicine

## 2022-07-08 ENCOUNTER — Ambulatory Visit (INDEPENDENT_AMBULATORY_CARE_PROVIDER_SITE_OTHER): Payer: 59 | Admitting: Sports Medicine

## 2022-07-08 DIAGNOSIS — C7951 Secondary malignant neoplasm of bone: Secondary | ICD-10-CM | POA: Diagnosis not present

## 2022-07-08 NOTE — Assessment & Plan Note (Signed)
This is a very pleasant 66 year old female, she returns for follow-up, to recap she was seen in the hospital initially for severe weakness, noted to be anemic, imaging did show what appeared to be widespread bony metastatic disease, bone marrow and lungs, she was evaluated by oncology, bone marrow biopsy was unrevealing. Tumor markers were negative. She does have a PET scan that showed that the bony lesions were not hypermetabolic. She does have a bone scan coming up. We still do not have evidence of a primary tumor. She does have a mammogram coming up tomorrow. She feels a lot better today, we will keep her up-to-date on her screening measures, and she understands that our role here will be more to support oncology providers and help her navigate the system and navigate her results. Ultimately from primary care standpoint I think she is stable and can see me back in about 6 months. We will follow her oncology work-up from afar.

## 2022-07-08 NOTE — Progress Notes (Signed)
    Procedures performed today:    None.  Independent interpretation of notes and tests performed by another provider:   None.  Brief History, Exam, Impression, and Recommendations:    Possible Metastatic disease ([place]) This is a very pleasant 66 year old female, she returns for follow-up, to recap she was seen in the hospital initially for severe weakness, noted to be anemic, imaging did show what appeared to be widespread bony metastatic disease, bone marrow and lungs, she was evaluated by oncology, bone marrow biopsy was unrevealing. Tumor markers were negative. She does have a PET scan that showed that the bony lesions were not hypermetabolic. She does have a bone scan coming up. We still do not have evidence of a primary tumor. She does have a mammogram coming up tomorrow. She feels a lot better today, we will keep her up-to-date on her screening measures, and she understands that our role here will be more to support oncology providers and help her navigate the system and navigate her results. Ultimately from primary care standpoint I think she is stable and can see me back in about 6 months. We will follow her oncology work-up from afar.    ____________________________________________ Gwen Her. Dianah Field, M.D., ABFM., CAQSM., AME. Primary Care and Sports Medicine [facility]  Adjunct Professor of [employer]  Risk manager

## 2022-07-09 ENCOUNTER — Encounter: Payer: Self-pay | Admitting: Family

## 2022-07-09 ENCOUNTER — Encounter (HOSPITAL_BASED_OUTPATIENT_CLINIC_OR_DEPARTMENT_OTHER): Payer: Self-pay

## 2022-07-09 ENCOUNTER — Ambulatory Visit (HOSPITAL_BASED_OUTPATIENT_CLINIC_OR_DEPARTMENT_OTHER)
Admission: RE | Admit: 2022-07-09 | Discharge: 2022-07-09 | Disposition: A | Discharge status: Home / Self Care | Payer: 59 | Source: Ambulatory Visit | Attending: Hematology & Oncology | Admitting: Hematology & Oncology

## 2022-07-09 DIAGNOSIS — Z1231 Encounter for screening mammogram for malignant neoplasm of breast: Secondary | ICD-10-CM | POA: Diagnosis not present

## 2022-07-09 DIAGNOSIS — C7951 Secondary malignant neoplasm of bone: Secondary | ICD-10-CM | POA: Insufficient documentation

## 2022-07-11 ENCOUNTER — Other Ambulatory Visit: Payer: Self-pay | Admitting: Hematology & Oncology

## 2022-07-11 DIAGNOSIS — R928 Other abnormal and inconclusive findings on diagnostic imaging of breast: Secondary | ICD-10-CM

## 2022-07-16 ENCOUNTER — Encounter (HOSPITAL_COMMUNITY)
Admission: RE | Admit: 2022-07-16 | Discharge: 2022-07-16 | Disposition: A | Discharge status: Home / Self Care | Payer: 59 | Source: Ambulatory Visit | Attending: Hematology & Oncology | Admitting: Hematology & Oncology

## 2022-07-16 DIAGNOSIS — C7951 Secondary malignant neoplasm of bone: Secondary | ICD-10-CM | POA: Diagnosis present

## 2022-07-16 MED ORDER — TECHNETIUM TC 99M MEDRONATE IV KIT
20.0000 | PACK | Freq: Once | INTRAVENOUS | Status: AC | PRN
  Administered 2022-07-16: 20.7 via INTRAVENOUS

## 2022-07-17 ENCOUNTER — Telehealth: Payer: Self-pay

## 2022-07-17 NOTE — Telephone Encounter (Signed)
-----   Message from Volanda Napoleon, MD sent at 07/17/2022  5:45 AM EDT ----- Call - the bone scan does not show any obvious bone lesions.  pete

## 2022-07-17 NOTE — Telephone Encounter (Signed)
Called and informed patient of results, patient verbalized understanding and denies any questions or concerns at this time.  ? ?

## 2022-07-18 ENCOUNTER — Ambulatory Visit: Payer: 59

## 2022-07-18 ENCOUNTER — Ambulatory Visit
Admission: RE | Admit: 2022-07-18 | Discharge: 2022-07-18 | Disposition: A | Discharge status: Home / Self Care | Payer: 59 | Source: Ambulatory Visit | Attending: Hematology & Oncology | Admitting: Hematology & Oncology

## 2022-07-18 DIAGNOSIS — R928 Other abnormal and inconclusive findings on diagnostic imaging of breast: Secondary | ICD-10-CM

## 2022-07-23 ENCOUNTER — Encounter: Payer: Self-pay | Admitting: *Deleted

## 2022-07-23 NOTE — Progress Notes (Signed)
Reviewed bone scan and mammogram results.   Oncology Nurse Navigator Documentation     07/23/2022    9:00 AM  Oncology Nurse Navigator Flowsheets  Navigator Follow Up Date: 07/30/2022  Navigator Follow Up Reason: Follow-up Appointment  Navigator Location [facility]  Navigator Encounter Type Appt/Treatment Plan Review  Patient Visit Type MedOnc  Treatment Phase Abnormal Scans  Barriers/Navigation Needs No Barriers At This Time  Interventions None Required  Acuity Level 1-No Barriers  Support Groups/Services Friends and Family  Time Spent with Patient 15

## 2022-07-30 ENCOUNTER — Encounter: Payer: Self-pay | Admitting: *Deleted

## 2022-07-30 ENCOUNTER — Inpatient Hospital Stay: Payer: 59 | Attending: Hematology & Oncology

## 2022-07-30 ENCOUNTER — Inpatient Hospital Stay (HOSPITAL_BASED_OUTPATIENT_CLINIC_OR_DEPARTMENT_OTHER): Payer: 59 | Admitting: Hematology & Oncology

## 2022-07-30 ENCOUNTER — Encounter: Payer: Self-pay | Admitting: Hematology & Oncology

## 2022-07-30 VITALS — BP 169/89 | HR 60 | Temp 97.7°F | Resp 18 | Ht 61.5 in | Wt 116.5 lb

## 2022-07-30 DIAGNOSIS — D6489 Other specified anemias: Secondary | ICD-10-CM | POA: Diagnosis not present

## 2022-07-30 DIAGNOSIS — D509 Iron deficiency anemia, unspecified: Secondary | ICD-10-CM | POA: Diagnosis not present

## 2022-07-30 DIAGNOSIS — M899 Disorder of bone, unspecified: Secondary | ICD-10-CM | POA: Insufficient documentation

## 2022-07-30 DIAGNOSIS — D631 Anemia in chronic kidney disease: Secondary | ICD-10-CM

## 2022-07-30 DIAGNOSIS — Z79899 Other long term (current) drug therapy: Secondary | ICD-10-CM | POA: Insufficient documentation

## 2022-07-30 DIAGNOSIS — C7951 Secondary malignant neoplasm of bone: Secondary | ICD-10-CM

## 2022-07-30 LAB — CMP (CANCER CENTER ONLY)
ALT: 28 U/L (ref 0–44)
AST: 61 U/L — ABNORMAL HIGH (ref 15–41)
Albumin: 4.6 g/dL (ref 3.5–5.0)
Alkaline Phosphatase: 71 U/L (ref 38–126)
Anion gap: 7 (ref 5–15)
BUN: 21 mg/dL (ref 8–23)
CO2: 30 mmol/L (ref 22–32)
Calcium: 9.8 mg/dL (ref 8.9–10.3)
Chloride: 100 mmol/L (ref 98–111)
Creatinine: 1.44 mg/dL — ABNORMAL HIGH (ref 0.44–1.00)
GFR, Estimated: 40 mL/min — ABNORMAL LOW (ref >60)
Glucose, Bld: 70 mg/dL (ref 70–99)
Potassium: 3.7 mmol/L (ref 3.5–5.1)
Sodium: 137 mmol/L (ref 135–145)
Total Bilirubin: 0.6 mg/dL (ref 0.3–1.2)
Total Protein: 8.3 g/dL — ABNORMAL HIGH (ref 6.5–8.1)

## 2022-07-30 LAB — IRON AND IRON BINDING CAPACITY (CC-WL,HP ONLY)
Iron: 76 ug/dL (ref 28–170)
Saturation Ratios: 35 % — ABNORMAL HIGH (ref 10.4–31.8)
TIBC: 220 ug/dL — ABNORMAL LOW (ref 250–450)
UIBC: 144 ug/dL — ABNORMAL LOW (ref 148–442)

## 2022-07-30 LAB — CBC WITH DIFFERENTIAL (CANCER CENTER ONLY)
Abs Immature Granulocytes: 0.01 10*3/uL (ref 0.00–0.07)
Basophils Absolute: 0.1 10*3/uL (ref 0.0–0.1)
Basophils Relative: 1 %
Eosinophils Absolute: 0.2 10*3/uL (ref 0.0–0.5)
Eosinophils Relative: 6 %
HCT: 31.3 % — ABNORMAL LOW (ref 36.0–46.0)
Hemoglobin: 9.8 g/dL — ABNORMAL LOW (ref 12.0–15.0)
Immature Granulocytes: 0 %
Lymphocytes Relative: 41 %
Lymphs Abs: 1.7 10*3/uL (ref 0.7–4.0)
MCH: 24.6 pg — ABNORMAL LOW (ref 26.0–34.0)
MCHC: 31.3 g/dL (ref 30.0–36.0)
MCV: 78.4 fL — ABNORMAL LOW (ref 80.0–100.0)
Monocytes Absolute: 0.2 10*3/uL (ref 0.1–1.0)
Monocytes Relative: 5 %
Neutro Abs: 2 10*3/uL (ref 1.7–7.7)
Neutrophils Relative %: 47 %
Platelet Count: 168 10*3/uL (ref 150–400)
RBC: 3.99 MIL/uL (ref 3.87–5.11)
RDW: 14.7 % (ref 11.5–15.5)
WBC Count: 4.3 10*3/uL (ref 4.0–10.5)
nRBC: 0 % (ref 0.0–0.2)

## 2022-07-30 LAB — FERRITIN: Ferritin: 648 ng/mL — ABNORMAL HIGH (ref 11–307)

## 2022-07-30 NOTE — Progress Notes (Signed)
Work up has relived no malignant diagnosis. Dr Marin Olp has no further orders for workup. Will instead focus on treating her anemia.   Oncology Nurse Navigator Documentation     07/30/2022   10:30 AM  Oncology Nurse Navigator Flowsheets  Navigation Complete Date: 07/30/2022  Post Navigation: Continue to Follow Patient? No  Reason Not Navigating Patient: No Cancer Diagnosis  Navigator Location [facility]  Navigator Encounter Type Appt/Treatment Plan Review  Patient Visit Type MedOnc  Treatment Phase Abnormal Scans  Barriers/Navigation Needs No Barriers At This Time  Interventions None Required  Acuity Level 1-No Barriers  Support Groups/Services Friends and Family  Time Spent with Patient 15

## 2022-07-30 NOTE — Progress Notes (Signed)
Hematology and Oncology Follow Up Visit  [name] [id number] 1956-03-30 66 y.o. 07/30/2022   Principle Diagnosis:  Spinal lesions-etiology unknown Anemia-microcytic  Current Therapy:   Observation     Interim History:  [name] is back for her follow-up.  Despite all of our test, we have yet to find any evidence of malignancy.  She had a bone scan that was done which did not show any activity in the spine at all..  She had a mammogram that was done which did not show any breast lesions.  The 1 problem that we do have that she is anemic.  She has microcytic anemia.  We have checked her iron studies.  These are okay.  She may have alpha thalassemia.  I told her to try some over-the-counter folic acid.  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  Her reticulocyte count is only 0.5%.  As such, I suspect that she may be a candidate for ESA.  I just do not see any other test that we need to do for her bones.  Again, we have not found anything that suggest cancer.  She is not having any pain.  She is not having any fever, sweats or chills.  There is no change in bowel or bladder habits.  She has had no rashes.  There is been no bleeding.  She has had no melena or bright red blood per rectum.  Overall, I would say performance status is probably ECOG 1.   Medications:  Current Outpatient Medications:    amLODipine (NORVASC) 10 MG tablet, Take 1 tablet (10 mg total) by mouth daily., Disp: 90 tablet, Rfl: 3   atorvastatin (LIPITOR) 40 MG tablet, Take 1 tablet (40 mg total) by mouth daily., Disp: 90 tablet, Rfl: 3   Calcium-Phosphorus-Vitamin D (CALCIUM GUMMIES) [phone] MG-MG-UNIT CHEW, Chew 1 tablet by mouth daily., Disp: , Rfl:    ECHINACEA PO, Take 1 capsule by mouth daily., Disp: , Rfl:    Elderberry 500 MG CAPS, Take 500 mg by mouth daily., Disp: , Rfl:    Fe Bisgly-Vit C-Vit B12-FA (GENTLE IRON PO), Take 1 capsule by mouth daily., Disp: , Rfl:    feeding supplement (ENSURE  ENLIVE / ENSURE PLUS) LIQD, Take 237 mLs by mouth 2 (two) times daily between meals., Disp: 237 mL, Rfl: 12   Flaxseed, Linseed, (FLAXSEED OIL) 1200 MG CAPS, Take 1,200 mg by mouth daily., Disp: , Rfl:    Iron-Vitamins (GERITOL PO), Take 1 tablet by mouth daily., Disp: , Rfl:    lisinopril (ZESTRIL) 20 MG tablet, Take 1 tablet (20 mg total) by mouth daily., Disp: 90 tablet, Rfl: 3   MAGNESIUM GLYCINATE PO, Take 400 mg by mouth daily., Disp: , Rfl:    Multiple Vitamin (MULTIVITAMIN WITH MINERALS) TABS tablet, Take 1 tablet by mouth daily., Disp: , Rfl:    P-Aminobenzoic Acid (PABA PO), Take 1 tablet by mouth daily. PABA 700, Disp: , Rfl:    Potassium 99 MG TABS, Take 99 mg by mouth daily., Disp: , Rfl:   Allergies:  Allergies  Allergen Reactions   Isovue [Iopamidol] Hives    Pt broke out in several facial hives, one on her back.  She will need full premeds in the future.  J Bohm No allergy demonstrated to  Orally ingested Iodinated agent. (Gastrographin CT Scan 05-12-16) Pt broke out in several facial hives, one on her back.  She will need full premeds in the future.  J Bohm   Iodine-131  Rash    Past Medical History, Surgical history, Social history, and Family History were reviewed and updated.  Review of Systems: Review of Systems  Constitutional: Negative.   HENT:  Negative.    Eyes: Negative.   Respiratory: Negative.    Cardiovascular: Negative.   Gastrointestinal: Negative.   Endocrine: Negative.   Genitourinary: Negative.    Musculoskeletal: Negative.   Skin: Negative.   Neurological: Negative.   Hematological: Negative.   Psychiatric/Behavioral: Negative.      Physical Exam:  height is 5' 1.5" (1.562 m) and weight is 116 lb 8 oz (52.8 kg). Her oral temperature is 97.7 F (36.5 C). Her blood pressure is 169/89 (abnormal) and her pulse is 60. Her respiration is 18 and oxygen saturation is 100%.   Wt Readings from Last 3 Encounters:  07/30/22 116 lb 8 oz (52.8 kg)   07/08/22 118 lb (53.5 kg)  07/02/22 119 lb (54 kg)    Physical Exam Vitals reviewed.  HENT:     Head: Normocephalic and atraumatic.  Eyes:     Pupils: Pupils are equal, round, and reactive to light.  Cardiovascular:     Rate and Rhythm: Normal rate and regular rhythm.     Heart sounds: Normal heart sounds.  Pulmonary:     Effort: Pulmonary effort is normal.     Breath sounds: Normal breath sounds.  Abdominal:     General: Bowel sounds are normal.     Palpations: Abdomen is soft.  Musculoskeletal:        General: No tenderness or deformity. Normal range of motion.     Cervical back: Normal range of motion.  Lymphadenopathy:     Cervical: No cervical adenopathy.  Skin:    General: Skin is warm and dry.     Findings: No erythema or rash.  Neurological:     Mental Status: She is alert and oriented to person, place, and time.  Psychiatric:        Behavior: Behavior normal.        Thought Content: Thought content normal.        Judgment: Judgment normal.      Lab Results  Component Value Date   WBC 4.3 07/30/2022   HGB 9.8 (L) 07/30/2022   HCT 31.3 (L) 07/30/2022   MCV 78.4 (L) 07/30/2022   PLT 168 07/30/2022     Chemistry      Component Value Date/Time   NA 137 07/30/2022 1001   NA 139 11/28/2017 1450   NA 140 10/28/2016 1127   K 3.7 07/30/2022 1001   K 3.9 11/28/2017 1450   K 3.3 (L) 10/28/2016 1127   CL 100 07/30/2022 1001   CL 99 11/28/2017 1450   CO2 30 07/30/2022 1001   CO2 31 11/28/2017 1450   CO2 26 10/28/2016 1127   BUN 21 07/30/2022 1001   BUN 19 11/28/2017 1450   BUN 12.4 10/28/2016 1127   CREATININE 1.44 (H) 07/30/2022 1001   CREATININE 1.13 (H) 02/17/2018 1519   CREATININE 1.0 10/28/2016 1127      Component Value Date/Time   CALCIUM 9.8 07/30/2022 1001   CALCIUM 9.5 11/28/2017 1450   CALCIUM 9.6 10/28/2016 1127   ALKPHOS 71 07/30/2022 1001   ALKPHOS 83 11/28/2017 1450   ALKPHOS 88 10/28/2016 1127   AST 61 (H) 07/30/2022 1001   AST 32  10/28/2016 1127   ALT 28 07/30/2022 1001   ALT 19 11/28/2017 1450   ALT 9 10/28/2016 1127   BILITOT  0.6 07/30/2022 1001   BILITOT 0.55 10/28/2016 1127       Impression and Plan: [name] is a very nice 66 year old Afro-American female.  She has the spinal lesions.  Despite all of our test, we have yet to find any issues with cancer.  As such, I do not see that we have to put her through any more testing.  I cannot imagine that she will need a biopsy.  I would have no clue what the orthopedist would even biopsy.  This the anemia that I worry about.  She is not symptomatic with it.  Looked the blood under the microscope.  She has some target cells.  As such, she clearly could have alpha thalassemia.  Again her erythropoietin level is on the low side.  I am sure she will qualify for ESA.  Let us see how everything works with just the folic acid.  I would like to get her back in a couple months now.  Hopefully, we will help get this anemia resolved   Volanda Napoleon, MD 8/15/202311:29 AM

## 2022-07-31 ENCOUNTER — Telehealth: Payer: Self-pay

## 2022-07-31 LAB — ERYTHROPOIETIN: Erythropoietin: 7.3 m[IU]/mL (ref 2.6–18.5)

## 2022-07-31 NOTE — Telephone Encounter (Signed)
-----   Message from Volanda Napoleon, MD sent at 07/30/2022  8:29 PM EDT ----- Call - the iron levels are ok.   Please take the folic acid every day!!!!!   Laurey Arrow

## 2022-07-31 NOTE — Telephone Encounter (Signed)
Called and informed patient's son Joneen Boers (ok per DPR) of lab results. He verbalized understanding and denies any questions or concerns at this time.

## 2022-07-31 NOTE — Telephone Encounter (Signed)
NANM on either VMs - will try again later.

## 2022-08-01 LAB — HGB FRACTIONATION CASCADE
Hgb A2: 2.2 % (ref 1.8–3.2)
Hgb A: 97.8 % (ref 96.4–98.8)
Hgb F: 0 % (ref 0.0–2.0)
Hgb S: 0 %

## 2022-09-24 ENCOUNTER — Inpatient Hospital Stay (HOSPITAL_BASED_OUTPATIENT_CLINIC_OR_DEPARTMENT_OTHER): Payer: 59 | Admitting: Hematology & Oncology

## 2022-09-24 ENCOUNTER — Inpatient Hospital Stay: Payer: 59 | Attending: Hematology & Oncology

## 2022-09-24 ENCOUNTER — Other Ambulatory Visit: Payer: Self-pay

## 2022-09-24 ENCOUNTER — Encounter: Payer: Self-pay | Admitting: Hematology & Oncology

## 2022-09-24 ENCOUNTER — Inpatient Hospital Stay: Payer: 59

## 2022-09-24 VITALS — BP 141/88 | HR 63 | Temp 98.0°F | Resp 18 | Ht 61.0 in | Wt 115.0 lb

## 2022-09-24 DIAGNOSIS — N189 Chronic kidney disease, unspecified: Secondary | ICD-10-CM | POA: Diagnosis present

## 2022-09-24 DIAGNOSIS — D631 Anemia in chronic kidney disease: Secondary | ICD-10-CM | POA: Diagnosis not present

## 2022-09-24 DIAGNOSIS — E611 Iron deficiency: Secondary | ICD-10-CM | POA: Diagnosis not present

## 2022-09-24 DIAGNOSIS — D649 Anemia, unspecified: Secondary | ICD-10-CM | POA: Diagnosis not present

## 2022-09-24 DIAGNOSIS — G959 Disease of spinal cord, unspecified: Secondary | ICD-10-CM

## 2022-09-24 LAB — CBC WITH DIFFERENTIAL (CANCER CENTER ONLY)
Abs Immature Granulocytes: 0.02 10*3/uL (ref 0.00–0.07)
Basophils Absolute: 0 10*3/uL (ref 0.0–0.1)
Basophils Relative: 1 %
Eosinophils Absolute: 0.2 10*3/uL (ref 0.0–0.5)
Eosinophils Relative: 5 %
HCT: 28.8 % — ABNORMAL LOW (ref 36.0–46.0)
Hemoglobin: 8.9 g/dL — ABNORMAL LOW (ref 12.0–15.0)
Immature Granulocytes: 1 %
Lymphocytes Relative: 60 %
Lymphs Abs: 2.4 10*3/uL (ref 0.7–4.0)
MCH: 23.9 pg — ABNORMAL LOW (ref 26.0–34.0)
MCHC: 30.9 g/dL (ref 30.0–36.0)
MCV: 77.2 fL — ABNORMAL LOW (ref 80.0–100.0)
Monocytes Absolute: 0.2 10*3/uL (ref 0.1–1.0)
Monocytes Relative: 5 %
Neutro Abs: 1.1 10*3/uL — ABNORMAL LOW (ref 1.7–7.7)
Neutrophils Relative %: 28 %
Platelet Count: 194 10*3/uL (ref 150–400)
RBC: 3.73 MIL/uL — ABNORMAL LOW (ref 3.87–5.11)
RDW: 14.2 % (ref 11.5–15.5)
WBC Count: 4 10*3/uL (ref 4.0–10.5)
nRBC: 0 % (ref 0.0–0.2)

## 2022-09-24 LAB — CMP (CANCER CENTER ONLY)
ALT: 7 U/L (ref 0–44)
AST: 30 U/L (ref 15–41)
Albumin: 4.1 g/dL (ref 3.5–5.0)
Alkaline Phosphatase: 60 U/L (ref 38–126)
Anion gap: 7 (ref 5–15)
BUN: 25 mg/dL — ABNORMAL HIGH (ref 8–23)
CO2: 31 mmol/L (ref 22–32)
Calcium: 9.6 mg/dL (ref 8.9–10.3)
Chloride: 102 mmol/L (ref 98–111)
Creatinine: 1.28 mg/dL — ABNORMAL HIGH (ref 0.44–1.00)
GFR, Estimated: 46 mL/min — ABNORMAL LOW (ref >60)
Glucose, Bld: 78 mg/dL (ref 70–99)
Potassium: 4.2 mmol/L (ref 3.5–5.1)
Sodium: 140 mmol/L (ref 135–145)
Total Bilirubin: 0.6 mg/dL (ref 0.3–1.2)
Total Protein: 7.4 g/dL (ref 6.5–8.1)

## 2022-09-24 LAB — FERRITIN: Ferritin: 507 ng/mL — ABNORMAL HIGH (ref 11–307)

## 2022-09-24 LAB — RETICULOCYTES
Immature Retic Fract: 9.2 % (ref 2.3–15.9)
RBC.: 3.78 MIL/uL — ABNORMAL LOW (ref 3.87–5.11)
Retic Count, Absolute: 26.8 10*3/uL (ref 19.0–186.0)
Retic Ct Pct: 0.7 % (ref 0.4–3.1)

## 2022-09-24 MED ORDER — SODIUM CHLORIDE 0.9% FLUSH: 3.0000 mL | Freq: Once | INTRAVENOUS | Status: DC | PRN

## 2022-09-24 MED ORDER — SODIUM CHLORIDE 0.9 % IV SOLN: Freq: Once | INTRAVENOUS | Status: DC

## 2022-09-24 MED ORDER — SODIUM CHLORIDE 0.9% FLUSH: 10.0000 mL | INTRAVENOUS | Status: DC | PRN

## 2022-09-24 MED ORDER — DARBEPOETIN ALFA 300 MCG/0.6ML IJ SOSY: 300.0000 ug | PREFILLED_SYRINGE | Freq: Once | INTRAMUSCULAR | Status: DC

## 2022-09-24 MED ORDER — EPOETIN ALFA-EPBX 40000 UNIT/ML IJ SOLN
40000.0000 [IU] | Freq: Once | INTRAMUSCULAR | Status: AC
  Administered 2022-09-24: 40000 [IU] via SUBCUTANEOUS
  Filled 2022-09-24: qty 1

## 2022-09-24 NOTE — Progress Notes (Signed)
Hematology and Oncology Follow Up Visit  [name] [id number] May 04, 1956 66 y.o. 09/24/2022   Principle Diagnosis:  Spinal lesions-etiology unknown Anemia-microcytic  Current Therapy:   Retacrit 40,000 units SQ weekly for hemoglobin less than 11 --start on 72/08/4708 Folic acid 1 mg p.o. daily-  OTC     Interim History:  [name] is back for her follow-up.  Her problem now is his anemia that she has.  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  Her reticulocyte count today was only 0.7%.  As such, her bone marrow just is not getting the stimulus to make red blood cells.  We are going to have to get her on Retacrit.  This is what her insurance will approve.  I just hate thatthe insurance would not approve Aranesp.  Aranesp will be a whole lot easier for her.  She is eating okay.  She is having no problems with pain.  She is having no issues with nausea or vomiting.  There is no change in bowel or bladder habits.  She has had no cough or shortness of breath.  There is no leg swelling.  Overall, I would say performance status is probably ECOG 1.    Medications:  Current Outpatient Medications:    amLODipine (NORVASC) 10 MG tablet, Take 1 tablet (10 mg total) by mouth daily., Disp: 90 tablet, Rfl: 3   atorvastatin (LIPITOR) 40 MG tablet, Take 1 tablet (40 mg total) by mouth daily., Disp: 90 tablet, Rfl: 3   Calcium-Phosphorus-Vitamin D (CALCIUM GUMMIES) [phone] MG-MG-UNIT CHEW, Chew 1 tablet by mouth daily., Disp: , Rfl:    ECHINACEA PO, Take 1 capsule by mouth daily., Disp: , Rfl:    Elderberry 500 MG CAPS, Take 500 mg by mouth daily., Disp: , Rfl:    Fe Bisgly-Vit C-Vit B12-FA (GENTLE IRON PO), Take 1 capsule by mouth daily., Disp: , Rfl:    Flaxseed, Linseed, (FLAXSEED OIL) 1200 MG CAPS, Take 1,200 mg by mouth daily., Disp: , Rfl:    Iron-Vitamins (GERITOL PO), Take 1 tablet by mouth daily., Disp: , Rfl:    lisinopril (ZESTRIL) 20 MG tablet, Take 1 tablet (20 mg total) by  mouth daily., Disp: 90 tablet, Rfl: 3   MAGNESIUM GLYCINATE PO, Take 400 mg by mouth daily., Disp: , Rfl:    Multiple Vitamin (MULTIVITAMIN WITH MINERALS) TABS tablet, Take 1 tablet by mouth daily., Disp: , Rfl:    P-Aminobenzoic Acid (PABA PO), Take 1 tablet by mouth daily. PABA 700, Disp: , Rfl:    Potassium 99 MG TABS, Take 99 mg by mouth daily., Disp: , Rfl:   Allergies:  Allergies  Allergen Reactions   Isovue [Iopamidol] Hives    Pt broke out in several facial hives, one on her back.  She will need full premeds in the future.  [name] No allergy demonstrated to  Orally ingested Iodinated agent. (Gastrographin CT Scan 05-12-16).   Iodine-131 Rash    Past Medical History, Surgical history, Social history, and Family History were reviewed and updated.  Review of Systems: Review of Systems  Constitutional: Negative.   HENT:  Negative.    Eyes: Negative.   Respiratory: Negative.    Cardiovascular: Negative.   Gastrointestinal: Negative.   Endocrine: Negative.   Genitourinary: Negative.    Musculoskeletal: Negative.   Skin: Negative.   Neurological: Negative.   Hematological: Negative.   Psychiatric/Behavioral: Negative.      Physical Exam:  height is '5\' 1"'$  (1.549 m) and weight is  115 lb (52.2 kg). Her oral temperature is 98 F (36.7 C). Her blood pressure is 141/88 (abnormal) and her pulse is 63. Her respiration is 18 and oxygen saturation is 100%.   Wt Readings from Last 3 Encounters:  09/24/22 115 lb (52.2 kg)  07/30/22 116 lb 8 oz (52.8 kg)  07/08/22 118 lb (53.5 kg)    Physical Exam Vitals reviewed.  HENT:     Head: Normocephalic and atraumatic.  Eyes:     Pupils: Pupils are equal, round, and reactive to light.  Cardiovascular:     Rate and Rhythm: Normal rate and regular rhythm.     Heart sounds: Normal heart sounds.  Pulmonary:     Effort: Pulmonary effort is normal.     Breath sounds: Normal breath sounds.  Abdominal:     General: Bowel sounds are normal.      Palpations: Abdomen is soft.  Musculoskeletal:        General: No tenderness or deformity. Normal range of motion.     Cervical back: Normal range of motion.  Lymphadenopathy:     Cervical: No cervical adenopathy.  Skin:    General: Skin is warm and dry.     Findings: No erythema or rash.  Neurological:     Mental Status: She is alert and oriented to person, place, and time.  Psychiatric:        Behavior: Behavior normal.        Thought Content: Thought content normal.        Judgment: Judgment normal.     Lab Results  Component Value Date   WBC 4.0 09/24/2022   HGB 8.9 (L) 09/24/2022   HCT 28.8 (L) 09/24/2022   MCV 77.2 (L) 09/24/2022   PLT 194 09/24/2022     Chemistry      Component Value Date/Time   NA 137 07/30/2022 1001   NA 139 11/28/2017 1450   NA 140 10/28/2016 1127   K 3.7 07/30/2022 1001   K 3.9 11/28/2017 1450   K 3.3 (L) 10/28/2016 1127   CL 100 07/30/2022 1001   CL 99 11/28/2017 1450   CO2 30 07/30/2022 1001   CO2 31 11/28/2017 1450   CO2 26 10/28/2016 1127   BUN 21 07/30/2022 1001   BUN 19 11/28/2017 1450   BUN 12.4 10/28/2016 1127   CREATININE 1.44 (H) 07/30/2022 1001   CREATININE 1.13 (H) 02/17/2018 1519   CREATININE 1.0 10/28/2016 1127      Component Value Date/Time   CALCIUM 9.8 07/30/2022 1001   CALCIUM 9.5 11/28/2017 1450   CALCIUM 9.6 10/28/2016 1127   ALKPHOS 71 07/30/2022 1001   ALKPHOS 83 11/28/2017 1450   ALKPHOS 88 10/28/2016 1127   AST 61 (H) 07/30/2022 1001   AST 32 10/28/2016 1127   ALT 28 07/30/2022 1001   ALT 19 11/28/2017 1450   ALT 9 10/28/2016 1127   BILITOT 0.6 07/30/2022 1001   BILITOT 0.55 10/28/2016 1127       Impression and Plan: [name] is a very nice 66 year old Afro-American female.  She has the spinal lesions.  Despite all of our tests, we have yet to find any issues with cancer.  As such, I do not see that we have to put her through any more testing.  I cannot imagine that she will need a biopsy.  I  would have no clue what the orthopedist would even biopsy.  I probably now is this anemia.  Again she has a very  low erythropoietin level and almost nonexistent reticulocyte count.  For right now, we will start her on Retacrit.  We will give her a dose today.  She will have to come back weekly for Retacrit injections.  I really would like to get her hemoglobin up above 10 before the holidays coming November.  We will have to monitor her iron studies also.  I would like to see her back in about 6 weeks.  At that time, we should be close to Thanksgiving and hopefully her hemoglobin will be close to 11, if not above.  Volanda Napoleon, MD 10/10/20233:29 PM

## 2022-09-24 NOTE — Patient Instructions (Signed)

## 2022-09-25 LAB — IRON AND IRON BINDING CAPACITY (CC-WL,HP ONLY)
Iron: 67 ug/dL (ref 28–170)
Saturation Ratios: 32 % — ABNORMAL HIGH (ref 10.4–31.8)
TIBC: 211 ug/dL — ABNORMAL LOW (ref 250–450)
UIBC: 144 ug/dL — ABNORMAL LOW (ref 148–442)

## 2022-09-30 ENCOUNTER — Other Ambulatory Visit: Payer: Self-pay | Admitting: *Deleted

## 2022-09-30 DIAGNOSIS — D631 Anemia in chronic kidney disease: Secondary | ICD-10-CM

## 2022-10-01 ENCOUNTER — Inpatient Hospital Stay: Payer: 59

## 2022-10-01 VITALS — BP 136/89 | HR 65 | Temp 98.0°F | Resp 17

## 2022-10-01 DIAGNOSIS — N189 Chronic kidney disease, unspecified: Secondary | ICD-10-CM | POA: Diagnosis not present

## 2022-10-01 DIAGNOSIS — D631 Anemia in chronic kidney disease: Secondary | ICD-10-CM

## 2022-10-01 LAB — CBC WITH DIFFERENTIAL (CANCER CENTER ONLY)
Abs Immature Granulocytes: 0.04 10*3/uL (ref 0.00–0.07)
Basophils Absolute: 0.1 10*3/uL (ref 0.0–0.1)
Basophils Relative: 1 %
Eosinophils Absolute: 0.2 10*3/uL (ref 0.0–0.5)
Eosinophils Relative: 4 %
HCT: 31.3 % — ABNORMAL LOW (ref 36.0–46.0)
Hemoglobin: 9.5 g/dL — ABNORMAL LOW (ref 12.0–15.0)
Immature Granulocytes: 1 %
Lymphocytes Relative: 46 %
Lymphs Abs: 1.8 10*3/uL (ref 0.7–4.0)
MCH: 24.1 pg — ABNORMAL LOW (ref 26.0–34.0)
MCHC: 30.4 g/dL (ref 30.0–36.0)
MCV: 79.4 fL — ABNORMAL LOW (ref 80.0–100.0)
Monocytes Absolute: 0.3 10*3/uL (ref 0.1–1.0)
Monocytes Relative: 6 %
Neutro Abs: 1.6 10*3/uL — ABNORMAL LOW (ref 1.7–7.7)
Neutrophils Relative %: 42 %
Platelet Count: 230 10*3/uL (ref 150–400)
RBC: 3.94 MIL/uL (ref 3.87–5.11)
RDW: 15.6 % — ABNORMAL HIGH (ref 11.5–15.5)
WBC Count: 3.9 10*3/uL — ABNORMAL LOW (ref 4.0–10.5)
nRBC: 1 % — ABNORMAL HIGH (ref 0.0–0.2)

## 2022-10-01 MED ORDER — EPOETIN ALFA-EPBX 40000 UNIT/ML IJ SOLN
40000.0000 [IU] | Freq: Once | INTRAMUSCULAR | Status: AC
  Administered 2022-10-01: 40000 [IU] via SUBCUTANEOUS
  Filled 2022-10-01: qty 1

## 2022-10-01 NOTE — Patient Instructions (Signed)

## 2022-10-07 ENCOUNTER — Other Ambulatory Visit: Payer: Self-pay | Admitting: Family

## 2022-10-07 DIAGNOSIS — D631 Anemia in chronic kidney disease: Secondary | ICD-10-CM

## 2022-10-07 LAB — ALPHA-THALASSEMIA GENOTYPR

## 2022-10-08 ENCOUNTER — Inpatient Hospital Stay: Payer: 59

## 2022-10-09 ENCOUNTER — Inpatient Hospital Stay: Payer: 59

## 2022-10-09 VITALS — BP 141/89 | HR 60 | Temp 97.9°F | Resp 18

## 2022-10-09 DIAGNOSIS — D631 Anemia in chronic kidney disease: Secondary | ICD-10-CM

## 2022-10-09 DIAGNOSIS — N189 Chronic kidney disease, unspecified: Secondary | ICD-10-CM | POA: Diagnosis not present

## 2022-10-09 LAB — CBC WITH DIFFERENTIAL (CANCER CENTER ONLY)
Abs Immature Granulocytes: 0.01 10*3/uL (ref 0.00–0.07)
Basophils Absolute: 0 10*3/uL (ref 0.0–0.1)
Basophils Relative: 1 %
Eosinophils Absolute: 0.1 10*3/uL (ref 0.0–0.5)
Eosinophils Relative: 4 %
HCT: 35.6 % — ABNORMAL LOW (ref 36.0–46.0)
Hemoglobin: 10.7 g/dL — ABNORMAL LOW (ref 12.0–15.0)
Immature Granulocytes: 0 %
Lymphocytes Relative: 50 %
Lymphs Abs: 1.6 10*3/uL (ref 0.7–4.0)
MCH: 24.4 pg — ABNORMAL LOW (ref 26.0–34.0)
MCHC: 30.1 g/dL (ref 30.0–36.0)
MCV: 81.1 fL (ref 80.0–100.0)
Monocytes Absolute: 0.3 10*3/uL (ref 0.1–1.0)
Monocytes Relative: 9 %
Neutro Abs: 1.2 10*3/uL — ABNORMAL LOW (ref 1.7–7.7)
Neutrophils Relative %: 36 %
Platelet Count: 169 10*3/uL (ref 150–400)
RBC: 4.39 MIL/uL (ref 3.87–5.11)
RDW: 17.9 % — ABNORMAL HIGH (ref 11.5–15.5)
WBC Count: 3.3 10*3/uL — ABNORMAL LOW (ref 4.0–10.5)
nRBC: 0.6 % — ABNORMAL HIGH (ref 0.0–0.2)

## 2022-10-09 LAB — CMP (CANCER CENTER ONLY)
ALT: 7 U/L (ref 0–44)
AST: 31 U/L (ref 15–41)
Albumin: 4.2 g/dL (ref 3.5–5.0)
Alkaline Phosphatase: 62 U/L (ref 38–126)
Anion gap: 6 (ref 5–15)
BUN: 21 mg/dL (ref 8–23)
CO2: 31 mmol/L (ref 22–32)
Calcium: 9.8 mg/dL (ref 8.9–10.3)
Chloride: 102 mmol/L (ref 98–111)
Creatinine: 1.74 mg/dL — ABNORMAL HIGH (ref 0.44–1.00)
GFR, Estimated: 32 mL/min — ABNORMAL LOW (ref >60)
Glucose, Bld: 76 mg/dL (ref 70–99)
Potassium: 4.1 mmol/L (ref 3.5–5.1)
Sodium: 139 mmol/L (ref 135–145)
Total Bilirubin: 0.7 mg/dL (ref 0.3–1.2)
Total Protein: 7.2 g/dL (ref 6.5–8.1)

## 2022-10-09 MED ORDER — EPOETIN ALFA-EPBX 40000 UNIT/ML IJ SOLN
40000.0000 [IU] | Freq: Once | INTRAMUSCULAR | Status: AC
  Administered 2022-10-09: 40000 [IU] via SUBCUTANEOUS
  Filled 2022-10-09: qty 1

## 2022-10-09 NOTE — Patient Instructions (Signed)

## 2022-10-15 ENCOUNTER — Other Ambulatory Visit: Payer: Self-pay

## 2022-10-15 ENCOUNTER — Inpatient Hospital Stay: Payer: 59

## 2022-10-15 DIAGNOSIS — D631 Anemia in chronic kidney disease: Secondary | ICD-10-CM

## 2022-10-15 DIAGNOSIS — N189 Chronic kidney disease, unspecified: Secondary | ICD-10-CM | POA: Diagnosis not present

## 2022-10-15 LAB — CMP (CANCER CENTER ONLY)
ALT: 9 U/L (ref 0–44)
AST: 34 U/L (ref 15–41)
Albumin: 3.8 g/dL (ref 3.5–5.0)
Alkaline Phosphatase: 60 U/L (ref 38–126)
Anion gap: 7 (ref 5–15)
BUN: 18 mg/dL (ref 8–23)
CO2: 28 mmol/L (ref 22–32)
Calcium: 9.1 mg/dL (ref 8.9–10.3)
Chloride: 105 mmol/L (ref 98–111)
Creatinine: 1.35 mg/dL — ABNORMAL HIGH (ref 0.44–1.00)
GFR, Estimated: 43 mL/min — ABNORMAL LOW (ref >60)
Glucose, Bld: 74 mg/dL (ref 70–99)
Potassium: 3.4 mmol/L — ABNORMAL LOW (ref 3.5–5.1)
Sodium: 140 mmol/L (ref 135–145)
Total Bilirubin: 0.9 mg/dL (ref 0.3–1.2)
Total Protein: 7.2 g/dL (ref 6.5–8.1)

## 2022-10-15 LAB — CBC WITH DIFFERENTIAL (CANCER CENTER ONLY)
Abs Immature Granulocytes: 0.01 10*3/uL (ref 0.00–0.07)
Basophils Absolute: 0 10*3/uL (ref 0.0–0.1)
Basophils Relative: 1 %
Eosinophils Absolute: 0.1 10*3/uL (ref 0.0–0.5)
Eosinophils Relative: 4 %
HCT: 38.4 % (ref 36.0–46.0)
Hemoglobin: 11.5 g/dL — ABNORMAL LOW (ref 12.0–15.0)
Immature Granulocytes: 0 %
Lymphocytes Relative: 50 %
Lymphs Abs: 1.6 10*3/uL (ref 0.7–4.0)
MCH: 24.5 pg — ABNORMAL LOW (ref 26.0–34.0)
MCHC: 29.9 g/dL — ABNORMAL LOW (ref 30.0–36.0)
MCV: 81.9 fL (ref 80.0–100.0)
Monocytes Absolute: 0.3 10*3/uL (ref 0.1–1.0)
Monocytes Relative: 9 %
Neutro Abs: 1.1 10*3/uL — ABNORMAL LOW (ref 1.7–7.7)
Neutrophils Relative %: 36 %
Platelet Count: 172 10*3/uL (ref 150–400)
RBC: 4.69 MIL/uL (ref 3.87–5.11)
RDW: 17.7 % — ABNORMAL HIGH (ref 11.5–15.5)
WBC Count: 3.1 10*3/uL — ABNORMAL LOW (ref 4.0–10.5)
nRBC: 0 % (ref 0.0–0.2)

## 2022-10-15 NOTE — Progress Notes (Signed)
Pt notified that no retacrit injection is needed today due to HGB is 11.5.  Pt has no questions at this time and schedule reviewed with pt.

## 2022-10-21 ENCOUNTER — Other Ambulatory Visit: Payer: Self-pay | Admitting: *Deleted

## 2022-10-21 DIAGNOSIS — D631 Anemia in chronic kidney disease: Secondary | ICD-10-CM

## 2022-10-22 ENCOUNTER — Inpatient Hospital Stay: Payer: 59 | Attending: Hematology & Oncology

## 2022-10-22 ENCOUNTER — Inpatient Hospital Stay: Payer: 59

## 2022-10-22 DIAGNOSIS — D649 Anemia, unspecified: Secondary | ICD-10-CM | POA: Insufficient documentation

## 2022-10-22 DIAGNOSIS — N189 Chronic kidney disease, unspecified: Secondary | ICD-10-CM | POA: Insufficient documentation

## 2022-10-22 DIAGNOSIS — D7389 Other diseases of spleen: Secondary | ICD-10-CM | POA: Insufficient documentation

## 2022-10-29 ENCOUNTER — Inpatient Hospital Stay: Payer: 59

## 2022-10-29 DIAGNOSIS — D7389 Other diseases of spleen: Secondary | ICD-10-CM | POA: Diagnosis not present

## 2022-10-29 DIAGNOSIS — D649 Anemia, unspecified: Secondary | ICD-10-CM | POA: Diagnosis not present

## 2022-10-29 DIAGNOSIS — N189 Chronic kidney disease, unspecified: Secondary | ICD-10-CM | POA: Diagnosis not present

## 2022-10-29 DIAGNOSIS — D631 Anemia in chronic kidney disease: Secondary | ICD-10-CM

## 2022-10-29 LAB — CMP (CANCER CENTER ONLY)
ALT: 8 U/L (ref 0–44)
AST: 29 U/L (ref 15–41)
Albumin: 3.8 g/dL (ref 3.5–5.0)
Alkaline Phosphatase: 61 U/L (ref 38–126)
Anion gap: 7 (ref 5–15)
BUN: 32 mg/dL — ABNORMAL HIGH (ref 8–23)
CO2: 28 mmol/L (ref 22–32)
Calcium: 9 mg/dL (ref 8.9–10.3)
Chloride: 105 mmol/L (ref 98–111)
Creatinine: 1.31 mg/dL — ABNORMAL HIGH (ref 0.44–1.00)
GFR, Estimated: 44 mL/min — ABNORMAL LOW (ref >60)
Glucose, Bld: 56 mg/dL — ABNORMAL LOW (ref 70–99)
Potassium: 3.7 mmol/L (ref 3.5–5.1)
Sodium: 140 mmol/L (ref 135–145)
Total Bilirubin: 0.7 mg/dL (ref 0.3–1.2)
Total Protein: 7.2 g/dL (ref 6.5–8.1)

## 2022-10-29 LAB — CBC WITH DIFFERENTIAL (CANCER CENTER ONLY)
Abs Immature Granulocytes: 0 10*3/uL (ref 0.00–0.07)
Basophils Absolute: 0.1 10*3/uL (ref 0.0–0.1)
Basophils Relative: 2 %
Eosinophils Absolute: 0.1 10*3/uL (ref 0.0–0.5)
Eosinophils Relative: 4 %
HCT: 39.6 % (ref 36.0–46.0)
Hemoglobin: 11.7 g/dL — ABNORMAL LOW (ref 12.0–15.0)
Immature Granulocytes: 0 %
Lymphocytes Relative: 44 %
Lymphs Abs: 1.3 10*3/uL (ref 0.7–4.0)
MCH: 23.6 pg — ABNORMAL LOW (ref 26.0–34.0)
MCHC: 29.5 g/dL — ABNORMAL LOW (ref 30.0–36.0)
MCV: 79.8 fL — ABNORMAL LOW (ref 80.0–100.0)
Monocytes Absolute: 0.2 10*3/uL (ref 0.1–1.0)
Monocytes Relative: 7 %
Neutro Abs: 1.3 10*3/uL — ABNORMAL LOW (ref 1.7–7.7)
Neutrophils Relative %: 43 %
Platelet Count: 148 10*3/uL — ABNORMAL LOW (ref 150–400)
RBC: 4.96 MIL/uL (ref 3.87–5.11)
RDW: 15.2 % (ref 11.5–15.5)
WBC Count: 2.9 10*3/uL — ABNORMAL LOW (ref 4.0–10.5)
nRBC: 0 % (ref 0.0–0.2)

## 2022-11-04 ENCOUNTER — Inpatient Hospital Stay: Payer: 59

## 2022-11-04 ENCOUNTER — Inpatient Hospital Stay: Payer: 59 | Admitting: Hematology & Oncology

## 2023-01-19 ENCOUNTER — Inpatient Hospital Stay (HOSPITAL_COMMUNITY)
Admission: EM | Admit: 2023-01-19 | Discharge: 2023-01-24 | DRG: 640 | Disposition: A | Discharge status: Home Health | Payer: 59 | Attending: Family Medicine | Admitting: Family Medicine

## 2023-01-19 ENCOUNTER — Emergency Department (HOSPITAL_COMMUNITY): Payer: 59

## 2023-01-19 ENCOUNTER — Observation Stay (HOSPITAL_COMMUNITY): Payer: 59

## 2023-01-19 ENCOUNTER — Other Ambulatory Visit: Payer: Self-pay

## 2023-01-19 DIAGNOSIS — E162 Hypoglycemia, unspecified: Secondary | ICD-10-CM | POA: Diagnosis not present

## 2023-01-19 DIAGNOSIS — E860 Dehydration: Secondary | ICD-10-CM | POA: Diagnosis present

## 2023-01-19 DIAGNOSIS — W182XXA Fall in (into) shower or empty bathtub, initial encounter: Secondary | ICD-10-CM | POA: Diagnosis present

## 2023-01-19 DIAGNOSIS — Z833 Family history of diabetes mellitus: Secondary | ICD-10-CM

## 2023-01-19 DIAGNOSIS — R569 Unspecified convulsions: Secondary | ICD-10-CM | POA: Diagnosis not present

## 2023-01-19 DIAGNOSIS — G9341 Metabolic encephalopathy: Secondary | ICD-10-CM | POA: Diagnosis present

## 2023-01-19 DIAGNOSIS — S40011A Contusion of right shoulder, initial encounter: Secondary | ICD-10-CM | POA: Diagnosis present

## 2023-01-19 DIAGNOSIS — R627 Adult failure to thrive: Secondary | ICD-10-CM | POA: Diagnosis present

## 2023-01-19 DIAGNOSIS — N1831 Chronic kidney disease, stage 3a: Secondary | ICD-10-CM | POA: Diagnosis present

## 2023-01-19 DIAGNOSIS — R4182 Altered mental status, unspecified: Secondary | ICD-10-CM

## 2023-01-19 DIAGNOSIS — R579 Shock, unspecified: Secondary | ICD-10-CM | POA: Diagnosis not present

## 2023-01-19 DIAGNOSIS — R680 Hypothermia, not associated with low environmental temperature: Secondary | ICD-10-CM | POA: Diagnosis present

## 2023-01-19 DIAGNOSIS — J690 Pneumonitis due to inhalation of food and vomit: Secondary | ICD-10-CM

## 2023-01-19 DIAGNOSIS — Z823 Family history of stroke: Secondary | ICD-10-CM

## 2023-01-19 DIAGNOSIS — D563 Thalassemia minor: Secondary | ICD-10-CM | POA: Diagnosis present

## 2023-01-19 DIAGNOSIS — E871 Hypo-osmolality and hyponatremia: Secondary | ICD-10-CM | POA: Diagnosis present

## 2023-01-19 DIAGNOSIS — N179 Acute kidney failure, unspecified: Secondary | ICD-10-CM | POA: Diagnosis present

## 2023-01-19 DIAGNOSIS — C799 Secondary malignant neoplasm of unspecified site: Secondary | ICD-10-CM | POA: Diagnosis present

## 2023-01-19 DIAGNOSIS — E785 Hyperlipidemia, unspecified: Secondary | ICD-10-CM | POA: Diagnosis present

## 2023-01-19 DIAGNOSIS — C7951 Secondary malignant neoplasm of bone: Secondary | ICD-10-CM

## 2023-01-19 DIAGNOSIS — I129 Hypertensive chronic kidney disease with stage 1 through stage 4 chronic kidney disease, or unspecified chronic kidney disease: Secondary | ICD-10-CM | POA: Diagnosis present

## 2023-01-19 DIAGNOSIS — T68XXXA Hypothermia, initial encounter: Secondary | ICD-10-CM

## 2023-01-19 DIAGNOSIS — Z1152 Encounter for screening for COVID-19: Secondary | ICD-10-CM

## 2023-01-19 DIAGNOSIS — I10 Essential (primary) hypertension: Secondary | ICD-10-CM | POA: Diagnosis present

## 2023-01-19 DIAGNOSIS — D631 Anemia in chronic kidney disease: Secondary | ICD-10-CM | POA: Diagnosis present

## 2023-01-19 DIAGNOSIS — Z83438 Family history of other disorder of lipoprotein metabolism and other lipidemia: Secondary | ICD-10-CM

## 2023-01-19 DIAGNOSIS — E861 Hypovolemia: Secondary | ICD-10-CM | POA: Diagnosis present

## 2023-01-19 LAB — CBC WITH DIFFERENTIAL/PLATELET
Abs Immature Granulocytes: 0.01 10*3/uL (ref 0.00–0.07)
Basophils Absolute: 0 10*3/uL (ref 0.0–0.1)
Basophils Relative: 1 %
Eosinophils Absolute: 0 10*3/uL (ref 0.0–0.5)
Eosinophils Relative: 0 %
HCT: 34.8 % — ABNORMAL LOW (ref 36.0–46.0)
Hemoglobin: 10.4 g/dL — ABNORMAL LOW (ref 12.0–15.0)
Immature Granulocytes: 0 %
Lymphocytes Relative: 12 %
Lymphs Abs: 0.5 10*3/uL — ABNORMAL LOW (ref 0.7–4.0)
MCH: 23 pg — ABNORMAL LOW (ref 26.0–34.0)
MCHC: 29.9 g/dL — ABNORMAL LOW (ref 30.0–36.0)
MCV: 76.8 fL — ABNORMAL LOW (ref 80.0–100.0)
Monocytes Absolute: 0.2 10*3/uL (ref 0.1–1.0)
Monocytes Relative: 5 %
Neutro Abs: 3.5 10*3/uL (ref 1.7–7.7)
Neutrophils Relative %: 82 %
Platelets: 123 10*3/uL — ABNORMAL LOW (ref 150–400)
RBC: 4.53 MIL/uL (ref 3.87–5.11)
RDW: 15.5 % (ref 11.5–15.5)
WBC: 4.3 10*3/uL (ref 4.0–10.5)
nRBC: 0 % (ref 0.0–0.2)

## 2023-01-19 LAB — CBG MONITORING, ED
Glucose-Capillary: 108 mg/dL — ABNORMAL HIGH (ref 70–99)
Glucose-Capillary: 214 mg/dL — ABNORMAL HIGH (ref 70–99)
Glucose-Capillary: 269 mg/dL — ABNORMAL HIGH (ref 70–99)
Glucose-Capillary: 327 mg/dL — ABNORMAL HIGH (ref 70–99)
Glucose-Capillary: 99 mg/dL (ref 70–99)

## 2023-01-19 LAB — I-STAT VENOUS BLOOD GAS, ED
Acid-base deficit: 2 mmol/L (ref 0.0–2.0)
Bicarbonate: 22.4 mmol/L (ref 20.0–28.0)
Calcium, Ion: 0.97 mmol/L — ABNORMAL LOW (ref 1.15–1.40)
HCT: 33 % — ABNORMAL LOW (ref 36.0–46.0)
Hemoglobin: 11.2 g/dL — ABNORMAL LOW (ref 12.0–15.0)
O2 Saturation: 77 %
Potassium: 3.5 mmol/L (ref 3.5–5.1)
Sodium: 133 mmol/L — ABNORMAL LOW (ref 135–145)
TCO2: 23 mmol/L (ref 22–32)
pCO2, Ven: 35.6 mmHg — ABNORMAL LOW (ref 44–60)
pH, Ven: 7.406 (ref 7.25–7.43)
pO2, Ven: 41 mmHg (ref 32–45)

## 2023-01-19 LAB — COMPREHENSIVE METABOLIC PANEL
ALT: 18 U/L (ref 0–44)
AST: 67 U/L — ABNORMAL HIGH (ref 15–41)
Albumin: 3.2 g/dL — ABNORMAL LOW (ref 3.5–5.0)
Alkaline Phosphatase: 69 U/L (ref 38–126)
Anion gap: 10 (ref 5–15)
BUN: 15 mg/dL (ref 8–23)
CO2: 24 mmol/L (ref 22–32)
Calcium: 8.7 mg/dL — ABNORMAL LOW (ref 8.9–10.3)
Chloride: 98 mmol/L (ref 98–111)
Creatinine, Ser: 1.02 mg/dL — ABNORMAL HIGH (ref 0.44–1.00)
GFR, Estimated: 60 mL/min — ABNORMAL LOW (ref >60)
Glucose, Bld: 186 mg/dL — ABNORMAL HIGH (ref 70–99)
Potassium: 3.7 mmol/L (ref 3.5–5.1)
Sodium: 132 mmol/L — ABNORMAL LOW (ref 135–145)
Total Bilirubin: 0.8 mg/dL (ref 0.3–1.2)
Total Protein: 6.4 g/dL — ABNORMAL LOW (ref 6.5–8.1)

## 2023-01-19 LAB — PROTIME-INR
INR: 1.2 (ref 0.8–1.2)
Prothrombin Time: 15.4 seconds — ABNORMAL HIGH (ref 11.4–15.2)

## 2023-01-19 LAB — TROPONIN I (HIGH SENSITIVITY)
Troponin I (High Sensitivity): 12 ng/L (ref <18)
Troponin I (High Sensitivity): 23 ng/L — ABNORMAL HIGH (ref <18)

## 2023-01-19 LAB — URINALYSIS, W/ REFLEX TO CULTURE (INFECTION SUSPECTED)
Bacteria, UA: NONE SEEN
Bilirubin Urine: NEGATIVE
Glucose, UA: >=500 mg/dL — AB
Ketones, ur: 20 mg/dL — AB
Leukocytes,Ua: NEGATIVE
Nitrite: NEGATIVE
Protein, ur: NEGATIVE mg/dL
Specific Gravity, Urine: 1.006 (ref 1.005–1.030)
pH: 7 (ref 5.0–8.0)

## 2023-01-19 LAB — LACTIC ACID, PLASMA
Lactic Acid, Venous: 3.4 mmol/L (ref 0.5–1.9)
Lactic Acid, Venous: 3.3 mmol/L (ref 0.5–1.9)

## 2023-01-19 LAB — TSH: TSH: 2.053 u[IU]/mL (ref 0.350–4.500)

## 2023-01-19 LAB — RESP PANEL BY RT-PCR (RSV, FLU A&B, COVID)  RVPGX2
Influenza A by PCR: NEGATIVE
Influenza B by PCR: NEGATIVE
Resp Syncytial Virus by PCR: NEGATIVE
SARS Coronavirus 2 by RT PCR: NEGATIVE

## 2023-01-19 LAB — DIC (DISSEMINATED INTRAVASCULAR COAGULATION)PANEL
D-Dimer, Quant: 0.71 ug/mL-FEU — ABNORMAL HIGH (ref 0.00–0.50)
Fibrinogen: 377 mg/dL (ref 210–475)
INR: 1.3 — ABNORMAL HIGH (ref 0.8–1.2)
Platelets: 146 10*3/uL — ABNORMAL LOW (ref 150–400)
Prothrombin Time: 15.7 seconds — ABNORMAL HIGH (ref 11.4–15.2)
Smear Review: NONE SEEN
aPTT: 42 seconds — ABNORMAL HIGH (ref 24–36)

## 2023-01-19 LAB — CK: Total CK: 1838 U/L — ABNORMAL HIGH (ref 38–234)

## 2023-01-19 LAB — APTT: aPTT: 46 seconds — ABNORMAL HIGH (ref 24–36)

## 2023-01-19 LAB — T4, FREE: Free T4: 0.49 ng/dL — ABNORMAL LOW (ref 0.61–1.12)

## 2023-01-19 MED ORDER — NOREPINEPHRINE 4 MG/250ML-% IV SOLN
0.0000 ug/min | INTRAVENOUS | Status: DC
  Administered 2023-01-19: 5 ug/min via INTRAVENOUS

## 2023-01-19 MED ORDER — VANCOMYCIN HCL 1250 MG/250ML IV SOLN
1250.0000 mg | Freq: Once | INTRAVENOUS | Status: AC
  Administered 2023-01-19: 1250 mg via INTRAVENOUS
  Filled 2023-01-19: qty 250

## 2023-01-19 MED ORDER — ACETAMINOPHEN 325 MG PO TABS: 650.0000 mg | ORAL_TABLET | Freq: Four times a day (QID) | ORAL | Status: DC | PRN

## 2023-01-19 MED ORDER — ENOXAPARIN SODIUM 40 MG/0.4ML IJ SOSY
40.0000 mg | PREFILLED_SYRINGE | INTRAMUSCULAR | Status: DC
  Administered 2023-01-20 – 2023-01-23 (×4): 40 mg via SUBCUTANEOUS
  Filled 2023-01-19 (×4): qty 0.4

## 2023-01-19 MED ORDER — DEXTROSE 50 % IV SOLN
INTRAVENOUS | Status: AC
  Administered 2023-01-19: 50 mL via INTRAVENOUS
  Filled 2023-01-19: qty 50

## 2023-01-19 MED ORDER — SODIUM CHLORIDE 0.9 % IV SOLN
2.0000 g | Freq: Once | INTRAVENOUS | Status: AC
  Administered 2023-01-19: 2 g via INTRAVENOUS
  Filled 2023-01-19: qty 12.5

## 2023-01-19 MED ORDER — LACTATED RINGERS IV BOLUS
1000.0000 mL | Freq: Once | INTRAVENOUS | Status: AC
  Administered 2023-01-19: 1000 mL via INTRAVENOUS

## 2023-01-19 MED ORDER — SODIUM CHLORIDE 0.9 % IV SOLN
3.0000 g | Freq: Once | INTRAVENOUS | Status: DC
  Filled 2023-01-19: qty 8

## 2023-01-19 MED ORDER — DEXTROSE 50 % IV SOLN: 1.0000 | Freq: Once | INTRAVENOUS | Status: AC

## 2023-01-19 MED ORDER — HYDROCORTISONE SOD SUC (PF) 100 MG IJ SOLR
100.0000 mg | Freq: Two times a day (BID) | INTRAMUSCULAR | Status: DC
  Administered 2023-01-20: 100 mg via INTRAVENOUS
  Filled 2023-01-19: qty 2

## 2023-01-19 MED ORDER — ACETAMINOPHEN 650 MG RE SUPP: 650.0000 mg | Freq: Four times a day (QID) | RECTAL | Status: DC | PRN

## 2023-01-19 MED ORDER — HYDROCORTISONE SOD SUC (PF) 100 MG IJ SOLR
100.0000 mg | Freq: Once | INTRAMUSCULAR | Status: AC
  Administered 2023-01-19: 100 mg via INTRAVENOUS
  Filled 2023-01-19: qty 2

## 2023-01-19 MED ORDER — ONDANSETRON HCL 4 MG PO TABS: 4.0000 mg | ORAL_TABLET | Freq: Four times a day (QID) | ORAL | Status: DC | PRN

## 2023-01-19 MED ORDER — NOREPINEPHRINE 4 MG/250ML-% IV SOLN: 2.0000 ug/min | INTRAVENOUS | Status: DC

## 2023-01-19 MED ORDER — METRONIDAZOLE 500 MG/100ML IV SOLN
500.0000 mg | Freq: Once | INTRAVENOUS | Status: AC
  Administered 2023-01-19: 500 mg via INTRAVENOUS
  Filled 2023-01-19: qty 100

## 2023-01-19 MED ORDER — ONDANSETRON HCL 4 MG/2ML IJ SOLN
4.0000 mg | Freq: Four times a day (QID) | INTRAMUSCULAR | Status: DC | PRN
  Administered 2023-01-19: 4 mg via INTRAVENOUS
  Filled 2023-01-19: qty 2

## 2023-01-19 MED ORDER — SODIUM CHLORIDE 0.9 % IV SOLN
250.0000 mL | INTRAVENOUS | Status: DC
  Administered 2023-01-19: 250 mL via INTRAVENOUS

## 2023-01-19 NOTE — Assessment & Plan Note (Signed)
Suspect pt had new onset seizure secondary to hypoglycemia. EEG MRI brain

## 2023-01-19 NOTE — ED Provider Notes (Signed)
[facility] Provider Note   CSN: [id number] Arrival date & time: 01/19/23  1743     History  Chief Complaint  Patient presents with   Altered Mental Status    [name] is a 67 y.o. female.  With PMH of HTN, HLD, alpha thalassemia, metastatic bony disease of unclear etiology brought in by EMS from home after found unresponsive and confused in the shower.  She was found to be hypoglycemic 30 given 25 g D10 with improvement of mental status.  On arrival, patient awake alert and oriented but slightly confused, hypothermic 32.2 C with blood sugar 108.  Patient unable to provide history because she does not know exactly what happened.  She is denying any pain, no chest pain, no headache, no abdominal pain, denying any recent illness no vomiting diarrhea or fevers or URI symptoms  I spoke to her son who is at the bedside who help provide history.  He says his mother has been having some generalized fatigue and weakness ongoing for a while but has been generally doing okay.  He last saw her around 8 PM last night.  Then when he came home at 4 PM today he had to knock down the door because she was not answering.  He found her unresponsive but still breathing on the floor in the shower and called EMS.  With EMS she was found to be hypoglycemic and given D10 with improvement.  Patient's son denies any recent illness, no fevers, no vomiting, no diarrhea or cough or other URI symptoms.  He denies any new medication use.  She is not on any medications for diabetes.  Of note he notes a previous history of low blood sugars of unclear etiology that required admission.   Altered Mental Status      Home Medications Prior to Admission medications   Medication Sig Start Date End Date Taking? Authorizing Provider  amLODipine (NORVASC) 10 MG tablet Take 1 tablet (10 mg total) by mouth daily. 05/26/22   Debbe Odea, MD  atorvastatin (LIPITOR) 40 MG tablet  Take 1 tablet (40 mg total) by mouth daily. 05/26/22   Debbe Odea, MD  Calcium-Phosphorus-Vitamin D (CALCIUM GUMMIES) 250-100-500 MG-MG-UNIT CHEW Chew 1 tablet by mouth daily.    [provider]  ECHINACEA PO Take 1 capsule by mouth daily.    [provider]  Elderberry 500 MG CAPS Take 500 mg by mouth daily.    [provider]  Fe Bisgly-Vit C-Vit B12-FA (GENTLE IRON PO) Take 1 capsule by mouth daily.    [provider]  Flaxseed, Linseed, (FLAXSEED OIL) 1200 MG CAPS Take 1,200 mg by mouth daily.    [provider]  Iron-Vitamins (GERITOL PO) Take 1 tablet by mouth daily.    [provider]  lisinopril (ZESTRIL) 20 MG tablet Take 1 tablet (20 mg total) by mouth daily. 05/26/22   Debbe Odea, MD  MAGNESIUM GLYCINATE PO Take 400 mg by mouth daily.    [provider]  Multiple Vitamin (MULTIVITAMIN WITH MINERALS) TABS tablet Take 1 tablet by mouth daily. 05/26/22   Debbe Odea, MD  P-Aminobenzoic Acid (PABA PO) Take 1 tablet by mouth daily. PABA 700    [provider]  Potassium 99 MG TABS Take 99 mg by mouth daily.    [provider]      Allergies    Isovue [iopamidol] and Iodine-131    Review of Systems   Review of Systems  Physical Exam Updated Vital Signs BP 110/78   Pulse 87   Temp 98.2 F (36.8 C) (Rectal)   Resp (!) 24   SpO2 100%  Physical Exam Constitutional: Alert and orientedxperson, place, and time. Slightly fatigued, slow to respond Eyes: Conjunctivae are normal. PERRL ENT      Head: Normocephalic and atraumatic.      Nose: No congestion.      Mouth/Throat: Right lateral tongue bite      Neck: No stridor. No midline ttp Cardiovascular: S1, S2,  cool, clammy, equal radial pulses and femoral pulses Respiratory: Normal respiratory effort. Breath sounds are normal.  O2 sat 100 on RA. Gastrointestinal: Soft and nontender.  Musculoskeletal: Normal range of motion in all extremities. No  pitting edema of b/l LE Neurologic: Normal speech and language. No gross focal neurologic deficits are appreciated. Skin: Skin is warm, dry and intact. No rash noted. Psychiatric: Mood and affect are normal. Speech and behavior are normal.  ED Results / Procedures / Treatments   Labs (all labs ordered are listed, but only abnormal results are displayed) Labs Reviewed  LACTIC ACID, PLASMA - Abnormal; Notable for the following components:      Result Value   Lactic Acid, Venous 3.4 (*)    All other components within normal limits  LACTIC ACID, PLASMA - Abnormal; Notable for the following components:   Lactic Acid, Venous 3.3 (*)    All other components within normal limits  CBC WITH DIFFERENTIAL/PLATELET - Abnormal; Notable for the following components:   Hemoglobin 10.4 (*)    HCT 34.8 (*)    MCV 76.8 (*)    MCH 23.0 (*)    MCHC 29.9 (*)    Platelets 123 (*)    Lymphs Abs 0.5 (*)    All other components within normal limits  PROTIME-INR - Abnormal; Notable for the following components:   Prothrombin Time 15.4 (*)    All other components within normal limits  APTT - Abnormal; Notable for the following components:   aPTT 46 (*)    All other components within normal limits  URINALYSIS, W/ REFLEX TO CULTURE (INFECTION SUSPECTED) - Abnormal; Notable for the following components:   Color, Urine STRAW (*)    Glucose, UA >=500 (*)    Hgb urine dipstick MODERATE (*)    Ketones, ur 20 (*)    All other components within normal limits  T4, FREE - Abnormal; Notable for the following components:   Free T4 0.49 (*)    All other components within normal limits  CK - Abnormal; Notable for the following components:   Total CK 1,838 (*)    All other components within normal limits  COMPREHENSIVE METABOLIC PANEL - Abnormal; Notable for the following components:   Sodium 132 (*)    Glucose, Bld 186 (*)    Creatinine, Ser 1.02 (*)    Calcium 8.7 (*)    Total Protein 6.4 (*)    Albumin 3.2  (*)    AST 67 (*)    GFR, Estimated 60 (*)    All other components within normal limits  DIC (DISSEMINATED INTRAVASCULAR COAGULATION)PANEL - Abnormal; Notable for the following components:   Prothrombin Time 15.7 (*)    INR 1.3 (*)    aPTT 42 (*)    D-Dimer, Quant 0.71 (*)    Platelets 146 (*)    All other components within normal limits  CBG MONITORING, ED - Abnormal; Notable for the following components:   Glucose-Capillary 108 (*)  All other components within normal limits  I-STAT VENOUS BLOOD GAS, ED - Abnormal; Notable for the following components:   pCO2, Ven 35.6 (*)    Sodium 133 (*)    Calcium, Ion 0.97 (*)    HCT 33.0 (*)    Hemoglobin 11.2 (*)    All other components within normal limits  CBG MONITORING, ED - Abnormal; Notable for the following components:   Glucose-Capillary 327 (*)    All other components within normal limits  CBG MONITORING, ED - Abnormal; Notable for the following components:   Glucose-Capillary 269 (*)    All other components within normal limits  CBG MONITORING, ED - Abnormal; Notable for the following components:   Glucose-Capillary 214 (*)    All other components within normal limits  TROPONIN I (HIGH SENSITIVITY) - Abnormal; Notable for the following components:   Troponin I (High Sensitivity) 23 (*)    All other components within normal limits  RESP PANEL BY RT-PCR (RSV, FLU A&B, COVID)  RVPGX2  CULTURE, BLOOD (ROUTINE X 2)  CULTURE, BLOOD (ROUTINE X 2)  TSH  CALCIUM, IONIZED  PROCALCITONIN  CBC  COMPREHENSIVE METABOLIC PANEL  CBG MONITORING, ED  TROPONIN I (HIGH SENSITIVITY)    EKG EKG Interpretation  Date/Time:  Sunday January 19 2023 18:03:26 EST Ventricular Rate:  82 PR Interval:  52 QRS Duration: 121 QT Interval:  441 QTC Calculation: 516 R Axis:   72 Text Interpretation: Sinus rhythm Short PR interval Right atrial enlargement Nonspecific intraventricular conduction delay Borderline T abnormalities, lateral leads  Borderline ST elevation, lateral leads Artifact in lead(s) I II III aVR aVL aVF V1 V2 V3 V4 V5 V6 Limited by motion artifact. Nonspecific ST depressions inferior and lateral leads  looks similar to previous Confirmed by Georgina Snell [phone]) on 01/19/2023 6:23:27 PM  Radiology CT Cervical Spine Wo Contrast  Result Date: 01/19/2023 CLINICAL DATA:  Patient was found on the floor unresponsive. Possible seizure. EXAM: CT CERVICAL SPINE WITHOUT CONTRAST TECHNIQUE: Multidetector CT imaging of the cervical spine was performed without intravenous contrast. Multiplanar CT image reconstructions were also generated. RADIATION DOSE REDUCTION: This exam was performed according to the departmental dose-optimization program which includes automated exposure control, adjustment of the mA and/or kV according to patient size and/or use of iterative reconstruction technique. COMPARISON:  None Available. FINDINGS: Alignment: Straightening of usual cervical lordosis is likely positional but could indicate muscle spasm. No anterior subluxations. Normal alignment of the posterior elements. C1-2 articulation appears intact. Skull base and vertebrae: Skull base appears intact. No vertebral compression deformities. Focal areas of sclerosis demonstrated in the T3 vertebra, incompletely included within the field of view. Possibly sclerotic metastasis. Soft tissues and spinal canal: No prevertebral soft tissue swelling. No abnormal paraspinal soft tissue mass or infiltration. Disc levels: Degenerative changes with disc space narrowing and endplate osteophyte formation, most prominent at C5-6 and C6-7 levels. Upper chest: Patchy ground-glass infiltrates in the right lung apex may represent pneumonia. Aspiration would be a possibility in this clinical scenario. Other: None. IMPRESSION: 1. Nonspecific straightening of usual cervical lordosis. No acute displaced fractures identified. 2. Degenerative changes in the cervical spine. 3. Sclerotic  foci demonstrated in the T3 vertebra, incompletely included, nonspecific but possibly metastatic disease. Clinical correlation is suggested. 4. Ground-glass infiltrates seen in the right lung apex likely due to pneumonia. Aspiration would be possible in this clinical setting. Electronically Signed   By: Lucienne Capers M.D.   On: 01/19/2023 19:33   CT Head Wo Contrast  Result Date: 01/19/2023 CLINICAL DATA:  Head trauma with possible seizure. Patient was found on the floor unresponsive. EXAM: CT HEAD WITHOUT CONTRAST TECHNIQUE: Contiguous axial images were obtained from the base of the skull through the vertex without intravenous contrast. RADIATION DOSE REDUCTION: This exam was performed according to the departmental dose-optimization program which includes automated exposure control, adjustment of the mA and/or kV according to patient size and/or use of iterative reconstruction technique. COMPARISON:  05/23/2022 FINDINGS: Brain: No evidence of acute infarction, hemorrhage, hydrocephalus, extra-axial collection or mass lesion/mass effect. Diffuse cerebral atrophy. Ventricular dilatation likely due to central atrophy. Patchy low-attenuation changes in the deep white matter consistent small vessel ischemia. Empty sella appearance. Vascular: Intracranial arterial calcifications. Skull: Calvarium appears intact. Sinuses/Orbits: Paranasal sinuses and mastoid air cells are clear. Other: None. IMPRESSION: No acute intracranial abnormalities. Chronic atrophy and small vessel ischemic changes. Electronically Signed   By: Lucienne Capers M.D.   On: 01/19/2023 19:29   DG Chest Port 1 View  Result Date: 01/19/2023 CLINICAL DATA:  Sepsis EXAM: PORTABLE CHEST 1 VIEW COMPARISON:  05/23/2022 FINDINGS: The heart size and mediastinal contours are within normal limits. Both lungs are clear. The visualized skeletal structures are unremarkable. IMPRESSION: No active disease. Electronically Signed   By: Randa Ngo M.D.   On:  01/19/2023 18:32    Procedures .Critical Care  Performed by: Elgie Congo, MD Authorized by: Elgie Congo, MD   Critical care provider statement:    Critical care time (minutes):  75   Critical care was necessary to treat or prevent imminent or life-threatening deterioration of the following conditions:  Sepsis and shock   Critical care was time spent personally by me on the following activities:  Development of treatment plan with patient or surrogate, discussions with consultants, evaluation of patient's response to treatment, examination of patient, ordering and review of laboratory studies, ordering and review of radiographic studies, ordering and performing treatments and interventions, pulse oximetry, re-evaluation of patient's condition, review of old charts and obtaining history from patient or surrogate   Care discussed with: admitting provider       Medications Ordered in ED Medications  0.9 %  sodium chloride infusion (250 mLs Intravenous New Bag/Given 01/19/23 2104)  norepinephrine (LEVOPHED) '4mg'$  in 232m (0.016 mg/mL) premix infusion (0 mcg/min Intravenous Hold 01/19/23 2110)  hydrocortisone sodium succinate (SOLU-CORTEF) 100 MG injection 100 mg (has no administration in time range)  enoxaparin (LOVENOX) injection 40 mg (has no administration in time range)  acetaminophen (TYLENOL) tablet 650 mg (has no administration in time range)    Or  acetaminophen (TYLENOL) suppository 650 mg (has no administration in time range)  ondansetron (ZOFRAN) tablet 4 mg ( Oral See Alternative 01/19/23 2340)    Or  ondansetron (ZOFRAN) injection 4 mg (4 mg Intravenous Given 01/19/23 2340)  lactated ringers bolus 1,000 mL (0 mLs Intravenous Stopped 01/19/23 1823)  dextrose 50 % solution 50 mL (50 mLs Intravenous Given 01/19/23 1754)  lactated ringers bolus 1,000 mL (0 mLs Intravenous Stopped 01/19/23 2110)  vancomycin (VANCOREADY) IVPB 1250 mg/250 mL (0 mg Intravenous Stopped 01/19/23 2340)   hydrocortisone sodium succinate (SOLU-CORTEF) 100 MG injection 100 mg (100 mg Intravenous Given 01/19/23 2035)  ceFEPIme (MAXIPIME) 2 g in sodium chloride 0.9 % 100 mL IVPB (0 g Intravenous Stopped 01/19/23 2148)  metroNIDAZOLE (FLAGYL) IVPB 500 mg (0 mg Intravenous Stopped 01/19/23 2317)    ED Course/ Medical Decision Making/ A&P Clinical Course as of 01/19/23 2345  [facility]  2012 Spoke with Dr. Curly Shores of neurology who does agree that her suspected seizure was likely secondary to hypoglycemia.  She recommends obtaining MRI with and without contrast of brain if able to depending on renal function as well as spot EEG. [VB]  2026 Called back into patient's room for persistently low blood pressures.  She has a palpable femoral pulse.  Systolic reading in the 16X.  She is awake alert and still responsive.  Broadening patient's antibiotics to vancomycin and cefepime and Flagyl.  Only evidence of an infection at this time would be possible aspiration pneumonia of the right upper lobe.  Also concern for possible adrenal insufficiency, ordered for IV hydrocortisone 100 mg.  Starting patient on nor epi infusion.  CMP and CK continuously hemolyzing.  CT head reviewed by me no evidence of ICH or acute abnormality.  CT C-spine negative.  However evidence of right upper lobe consolidation concerning for aspiration pneumonia.  Other labs notable for normal white blood cell count 4.3.  Stable anemia microcytic in nature hemoglobin 10.4.  Prolonged PT and PTT concerning for possible developing sepsis or date DIC with low platelets and hemoglobin.  Will add on further labs to further evaluate.  Initial lactate 3.4.  T4 low however normal TSH, doubt myxedema coma. [VB]  2130 Patient off pressors now mentating more appropriately.  CK18 38.  Repeat Trope up trended 23 likely demand ischemia in the setting of hypothermia, episode of hypotension.  Her creatinine is 1.02.  Mild hyponatremia 132.  Glucose 186 on CMP.   Mildly elevated AST 67. [VB]  2249 Discussed case with hospitalist Dr Alcario Drought who will be down to evaluate the patient and put in orders for admission. [VB]    Clinical Course User Index [VB] Elgie Congo, MD   {                            Medical Decision Making Francia Verry is a 67 y.o. female.  With PMH of HTN, HLD, alpha thalassemia, metastatic bony disease of unclear etiology brought in by EMS from home after found unresponsive and confused in the shower.  She was found to be hypoglycemic 30 given 25 g D10 with improvement of mental status.  On arrival, patient awake alert and oriented but slightly confused, hypothermic 32.2 C with blood sugar 108.  Started on [facility] here and warmed IV fluids and given 1 dose of D50.  Patient's presentation with hypothermia and hypoglycemia can have multiple underlying etiologies including but not limited to myxedema coma, sepsis, adrenal insufficiency among multiple other etiologies.  I have reviewed her med list, there are no medications that could induce hypoglycemia and no beta blockers.  Of note, she does have evidence of a tongue bite and urinary incontinence, suspect she likely had a seizure which could be provoked due to hypoglycemia.  No seizure history in chart review.  Dr. But got suggest patient had seizure from hypoglycemia recommending MRI brain with and without contrast and spot EEG inpatient.  CT head reviewed by me no evidence of ICH.  No acute intracranial or traumatic injuries on CT head and C-spine however evidence of right upper lobe pneumonia concerning for aspiration pneumonia.  With concern for sepsis although normal white blood cell count here, started patient on broad-spectrum antibiotics and warm fluids.  Her sugars have remained normal on repeated checks, mentation has remained appropriate.  She did have episode of hypotension  requiring brief episode of pressors but essentially came off.  She does have CK18 38 mild  rhabdomyolysis as well as slightly elevated creatinine 1.02.  She had mild hyponatremia 132.  Due to concern for possible adrenal insufficiency given a dose of hydrocortisone IV.  Gust case with Dr. Alcario Drought hospitalist will admit patient for further workup and management.  Amount and/or Complexity of Data Reviewed Labs: ordered. Radiology: ordered. ECG/medicine tests: ordered.  Risk Prescription drug management. Decision regarding hospitalization.    Final Clinical Impression(s) / ED Diagnoses Final diagnoses:  Altered mental status, unspecified altered mental status type  Hypothermia, initial encounter  Hypoglycemia  Aspiration pneumonia of right upper lobe, unspecified aspiration pneumonia type ([place])  [facility])    Rx / DC Orders ED Discharge Orders     None         Elgie Congo, MD 01/19/23 2345

## 2023-01-19 NOTE — Assessment & Plan Note (Signed)
HYPOtensive in ED Holding all home BP meds

## 2023-01-19 NOTE — H&P (Signed)
History and Physical    Patient: [name] GGY:[id number] DOB: 08-Oct-1956 DOA: 01/19/2023 DOS: the patient was seen and examined on 01/19/2023 PCP: Silverio Decamp, MD  Patient coming from: Home  Chief Complaint:  Chief Complaint  Patient presents with   Altered Mental Status   HPI: [name] is a 67 y.o. female with medical history significant of HTN, alpha thal, sclerotic bone lesions of unclear etiology with work up last year that didn't result in any definite cancer diagnosis.  Pt brought in to ED after found unresponsive and confused in shower.  EMS found her to be hypoglycemic to 30, gave 25g of D10 (D50?) with improvement in mental status.  Patient unable to provide history because she does not know exactly what happened. She is denying any pain, no chest pain, no headache, no abdominal pain, denying any recent illness no vomiting diarrhea or fevers or URI symptoms   Per son: He says his mother has been having some generalized fatigue and weakness ongoing for a while but has been generally doing okay. He last saw her around 8 PM last night. Then when he came home at 4 PM today he had to knock down the door because she was not answering. He found her unresponsive but still breathing on the floor in the shower and called EMS. With EMS she was found to be hypoglycemic and given D10 with improvement. Patient's son denies any recent illness, no fevers, no vomiting, no diarrhea or cough or other URI symptoms. He denies any new medication use. She is not on any medications for diabetes. Of note he notes a previous history of low blood sugars of unclear etiology that required admission (looks like this was June 2023).  Pt initially hypotensive and hypothermic in ED.  Bair hugger applied.  Hypotension seems to have resolved following 2L IVF bolus, 100g solu-cortef, and correction of hypoglycemia.  Pt also given cefepime, flagyl, vanc for possible sepsis.   Review of Systems:  As mentioned in the history of present illness. All other systems reviewed and are negative. 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 She has never used smokeless tobacco. She reports that she does not drink alcohol and does not use drugs.  Allergies  Allergen Reactions   Isovue [Iopamidol] Hives    Pt broke out in several facial hives, one on her back.  She will need full premeds in the future.  J Bohm No allergy demonstrated to  Orally ingested Iodinated agent. (Gastrographin CT Scan 05-12-16).   Iodine-131 Rash    Family History  Problem Relation Age of Onset   Stroke Mother    Hyperlipidemia Mother    Diabetes Mother     Prior to Admission medications   Medication Sig Start Date End Date Taking? Authorizing Provider  amLODipine (NORVASC) 10 MG tablet Take 1 tablet (10 mg total) by mouth daily. 05/26/22  Debbe Odea, MD  atorvastatin (LIPITOR) 40 MG tablet Take 1 tablet (40 mg total) by mouth daily. 05/26/22   Debbe Odea, MD  Calcium-Phosphorus-Vitamin D (CALCIUM GUMMIES) 250-100-500 MG-MG-UNIT CHEW Chew 1 tablet by mouth daily.    [provider]  ECHINACEA PO Take 1 capsule by mouth daily.    [provider]  Elderberry 500 MG CAPS Take 500 mg by mouth daily.    [provider]  Fe Bisgly-Vit C-Vit B12-FA (GENTLE IRON PO) Take 1 capsule by mouth daily.     [provider]  Flaxseed, Linseed, (FLAXSEED OIL) 1200 MG CAPS Take 1,200 mg by mouth daily.    [provider]  Iron-Vitamins (GERITOL PO) Take 1 tablet by mouth daily.    [provider]  lisinopril (ZESTRIL) 20 MG tablet Take 1 tablet (20 mg total) by mouth daily. 05/26/22   Debbe Odea, MD  MAGNESIUM GLYCINATE PO Take 400 mg by mouth daily.    [provider]  Multiple Vitamin (MULTIVITAMIN WITH MINERALS) TABS tablet Take 1 tablet by mouth daily. 05/26/22   Debbe Odea, MD  P-Aminobenzoic Acid (PABA PO) Take 1 tablet by mouth daily. PABA 700    [provider]  Potassium 99 MG TABS Take 99 mg by mouth daily.    [provider]    Physical Exam: Vitals:   01/19/23 2100 01/19/23 2116 01/19/23 2130 01/19/23 2145  BP: (!) 137/94  114/79 105/73  Pulse: 68  74 75  Resp: '20  20 20  '$ Temp:  (!) 95.7 F (35.4 C)    TempSrc:  Rectal    SpO2: 100%  100% 100%   Constitutional: NAD, calm, comfortable Respiratory: clear to auscultation bilaterally, no wheezing, no crackles. Normal respiratory effort. No accessory muscle use.  Cardiovascular: Regular rate and rhythm, no murmurs / rubs / gallops. No extremity edema. 2+ pedal pulses. No carotid bruits.  Abdomen: no tenderness, no masses palpated. No hepatosplenomegaly. Bowel sounds positive. Skin: No foot ulcer, no sacral decubitus Neurologic: Grossly non-focal Psychiatric: AAOx3, slow to respond however, speech seems delayed  Data Reviewed:     CXR neg  CT neck:IMPRESSION: 1. Nonspecific straightening of usual cervical lordosis. No acute displaced fractures identified. 2. Degenerative changes in the cervical spine. 3. Sclerotic foci demonstrated in the T3 vertebra, incompletely included, nonspecific but possibly metastatic disease. Clinical correlation is suggested. 4. Ground-glass infiltrates seen in the right lung apex likely due to pneumonia. Aspiration would be possible in  this clinical setting.  CT head: no acute findings  Urinalysis    Component Value Date/Time   COLORURINE STRAW (A) 01/19/2023 2130   APPEARANCEUR CLEAR 01/19/2023 2130   LABSPEC 1.006 01/19/2023 2130   PHURINE 7.0 01/19/2023 2130   GLUCOSEU >=500 (A) 01/19/2023 2130   HGBUR MODERATE (A) 01/19/2023 2130   BILIRUBINUR NEGATIVE 01/19/2023 2130   KETONESUR 20 (A) 01/19/2023 2130   PROTEINUR NEGATIVE 01/19/2023 2130   NITRITE NEGATIVE 01/19/2023 2130   LEUKOCYTESUR NEGATIVE 01/19/2023 2130       Latest Ref Rng & Units 01/19/2023    8:50 PM 01/19/2023    6:33 PM 01/19/2023    5:51 PM  CBC  WBC 4.0 - 10.5 K/uL   4.3   Hemoglobin 12.0 - 15.0 g/dL  11.2  10.4   Hematocrit 36.0 - 46.0 %  33.0  34.8   Platelets 150 - 400 K/uL 146   123       Latest Ref Rng & Units 01/19/2023  8:30 PM 01/19/2023    6:33 PM 10/29/2022    3:00 PM  CMP  Glucose 70 - 99 mg/dL 186   56   BUN 8 - 23 mg/dL 15   32   Creatinine 0.44 - 1.00 mg/dL 1.02   1.31   Sodium 135 - 145 mmol/L 132  133  140   Potassium 3.5 - 5.1 mmol/L 3.7  3.5  3.7   Chloride 98 - 111 mmol/L 98   105   CO2 22 - 32 mmol/L 24   28   Calcium 8.9 - 10.3 mg/dL 8.7   9.0   Total Protein 6.5 - 8.1 g/dL 6.4   7.2   Total Bilirubin 0.3 - 1.2 mg/dL 0.8   0.7   Alkaline Phos 38 - 126 U/L 69   61   AST 15 - 41 U/L 67   29   ALT 0 - 44 U/L 18   8     Lactates 3.4 -> 3.3  CPK 1800  Assessment and Plan: * Shock ([place]) Hypotension and hypothermia with lactic acid elevation on presentation. Shock of unclear etiology, possibly just hypothermia due to hypoglycemia? Adrenal crisis also possible: would explain the hypoglycemia as well, also would explain her quick turn around after being given solucortef.  Cortisol level was very low end of normal when checked during admission in June last year for hypoglycemia of unclear etiology. Continue stress dose solucortef Given hypoglycemia requiring admission 2x in past year now.  Probably needs  endocrinology follow up vs consult. Got broad spectrum empiric ABx in ED for possible sepsis. Only apparent source seems to be RUL PNA seen on CT neck. Not at all clear that she is septic though. Will check procalcitonin Order further sepsis work up if positive Not clear if CT findings are PNA vs pneumonitis at this stage, ill leave her on CAP ABx for the moment though T4 slightly low but TSH nl CPK of 1800, repeat in AM  Hypoglycemia Unclear etiology in this patient without h/o DM, not on any DM meds. Improved after D50 CBG checks Q1H Empirically treating adrenal insufficiency as discussed above Likely warrants endocrine consult vs follow up if IP consult not available for further work up at this stage given that this is 2nd admit for same in past year.  Seizure ([place]) Suspect pt had new onset seizure secondary to hypoglycemia. EEG MRI brain  Possible Metastatic disease ([place]) Sclerotic bone lesions (presumably one of which seen on CT spine today) seen during June admit last year. Saw oncology in follow up, fairly extensive work up and biopsy, but no definite confirmation of malignancy (See Dr. Antonieta Pert office notes).  Hypertension HYPOtensive in ED Holding all home BP meds      Advance Care Planning:   Code Status: Full Code   Consults: None  Family Communication: Son at bedside  Severity of Illness: The appropriate patient status for this patient is OBSERVATION. Observation status is judged to be reasonable and necessary in order to provide the required intensity of service to ensure the patient's safety. The patient's presenting symptoms, physical exam findings, and initial radiographic and laboratory data in the context of their medical condition is felt to place them at decreased risk for further clinical deterioration. Furthermore, it is anticipated that the patient will be medically stable for discharge from the hospital within 2 midnights of admission.    Author: Etta Quill., DO 01/19/2023 11:25 PM  For on call review [email or web address].

## 2023-01-19 NOTE — ED Notes (Addendum)
Placed warm blankets on pt, until we are able to get blankets that go to [employer]. Warm LR is going on pt and heat was turned to max in room.

## 2023-01-19 NOTE — Progress Notes (Signed)
Automatic consult populated for [facility] for vasopressor received. In reviewing the chart. It is noted that the vasopressor is on hold at this time. Fran Lowes, RN VAST

## 2023-01-19 NOTE — ED Notes (Signed)
Pt's rectal temp 98.2; warmer removed

## 2023-01-19 NOTE — ED Notes (Signed)
Trauma Event Note  Noted BP in the 50s when walking by treatment room. Dr. Nechama Guard at bedside, initiated norei gtt see EMAR/VS flowsheet Last imported Vital Signs BP (!) 53/40   Pulse 61   Temp (!) 90 F (32.2 C) (Rectal)   Resp (!) 21   SpO2 100%   Trending CBC Recent Labs    01/19/23 1751 01/19/23 1833  WBC 4.3  --   HGB 10.4* 11.2*  HCT 34.8* 33.0*  PLT 123*  --     Trending Coag's Recent Labs    01/19/23 1751  APTT 46*  INR 1.2    Trending BMET Recent Labs    01/19/23 1833  NA 133*  K 3.5      [name] O Janne Faulk  Trauma Response RN  Please call TRN at [phone] for further assistance.

## 2023-01-19 NOTE — ED Triage Notes (Signed)
Pt arrived via [facility] from home. Per EMS, pt's son found pt on floor unresponsive. Per EMS, pt initial glucose was 30 then went to 159 then went to 137. EMS gave D10 25g. Pt is A&Ox4. Pt has a bruise on right side of tongue like she bit her tongue. Pt is soaking wet. Pt's clothes are soaking wet.

## 2023-01-19 NOTE — Assessment & Plan Note (Signed)
Unclear etiology in this patient without h/o DM, not on any DM meds. Improved after D50 CBG checks Q1H Empirically treating adrenal insufficiency as discussed above Likely warrants endocrine consult vs follow up if IP consult not available for further work up at this stage given that this is 2nd admit for same in past year.

## 2023-01-19 NOTE — Assessment & Plan Note (Addendum)
Hypotension and hypothermia with lactic acid elevation on presentation. Shock of unclear etiology, possibly just hypothermia due to hypoglycemia? Adrenal crisis also possible: would explain the hypoglycemia as well, also would explain her quick turn around after being given solucortef.  Cortisol level was very low end of normal when checked during admission in June last year for hypoglycemia of unclear etiology. Continue stress dose solucortef Given hypoglycemia requiring admission 2x in past year now.  Probably needs endocrinology follow up vs consult. Got broad spectrum empiric ABx in ED for possible sepsis. Only apparent source seems to be RUL PNA seen on CT neck. Not at all clear that she is septic though. Will check procalcitonin Order further sepsis ABx / work up if positive Not clear if CT findings are PNA vs pneumonitis at this stage, ill leave her on CAP ABx for the moment though T4 slightly low but TSH nl CPK of 1800, repeat in AM

## 2023-01-19 NOTE — ED Notes (Signed)
Dr Alcario Drought at bedside and pt taken to MRI

## 2023-01-19 NOTE — Assessment & Plan Note (Addendum)
Sclerotic bone lesions (presumably one of which seen on CT spine today) seen during June admit last year. Saw oncology in follow up, fairly extensive work up and biopsy, but no definite confirmation of malignancy (See Dr. Gustavo Lah office notes). - Needs colonoscopy - Obtain CT chest abdomen and pelvis with steroid prep

## 2023-01-19 NOTE — ED Notes (Signed)
Pt vomiting small amount of emesis. Head of bed raised, medication given

## 2023-01-19 NOTE — ED Notes (Signed)
Placed pt on bair hugger

## 2023-01-20 ENCOUNTER — Observation Stay (HOSPITAL_COMMUNITY): Payer: 59

## 2023-01-20 DIAGNOSIS — R4182 Altered mental status, unspecified: Secondary | ICD-10-CM | POA: Diagnosis not present

## 2023-01-20 DIAGNOSIS — R579 Shock, unspecified: Secondary | ICD-10-CM | POA: Diagnosis present

## 2023-01-20 DIAGNOSIS — Z83438 Family history of other disorder of lipoprotein metabolism and other lipidemia: Secondary | ICD-10-CM | POA: Diagnosis not present

## 2023-01-20 DIAGNOSIS — Z1152 Encounter for screening for COVID-19: Secondary | ICD-10-CM | POA: Diagnosis not present

## 2023-01-20 DIAGNOSIS — C799 Secondary malignant neoplasm of unspecified site: Secondary | ICD-10-CM | POA: Diagnosis not present

## 2023-01-20 DIAGNOSIS — Z823 Family history of stroke: Secondary | ICD-10-CM | POA: Diagnosis not present

## 2023-01-20 DIAGNOSIS — E871 Hypo-osmolality and hyponatremia: Secondary | ICD-10-CM | POA: Diagnosis present

## 2023-01-20 DIAGNOSIS — T68XXXA Hypothermia, initial encounter: Secondary | ICD-10-CM | POA: Diagnosis not present

## 2023-01-20 DIAGNOSIS — E785 Hyperlipidemia, unspecified: Secondary | ICD-10-CM | POA: Diagnosis present

## 2023-01-20 DIAGNOSIS — R680 Hypothermia, not associated with low environmental temperature: Secondary | ICD-10-CM | POA: Diagnosis present

## 2023-01-20 DIAGNOSIS — N1831 Chronic kidney disease, stage 3a: Secondary | ICD-10-CM | POA: Diagnosis present

## 2023-01-20 DIAGNOSIS — E162 Hypoglycemia, unspecified: Secondary | ICD-10-CM | POA: Diagnosis present

## 2023-01-20 DIAGNOSIS — R627 Adult failure to thrive: Secondary | ICD-10-CM | POA: Diagnosis present

## 2023-01-20 DIAGNOSIS — D631 Anemia in chronic kidney disease: Secondary | ICD-10-CM | POA: Diagnosis present

## 2023-01-20 DIAGNOSIS — N179 Acute kidney failure, unspecified: Secondary | ICD-10-CM | POA: Diagnosis present

## 2023-01-20 DIAGNOSIS — D563 Thalassemia minor: Secondary | ICD-10-CM | POA: Diagnosis present

## 2023-01-20 DIAGNOSIS — R569 Unspecified convulsions: Secondary | ICD-10-CM | POA: Diagnosis not present

## 2023-01-20 DIAGNOSIS — G9341 Metabolic encephalopathy: Secondary | ICD-10-CM | POA: Diagnosis present

## 2023-01-20 DIAGNOSIS — I129 Hypertensive chronic kidney disease with stage 1 through stage 4 chronic kidney disease, or unspecified chronic kidney disease: Secondary | ICD-10-CM | POA: Diagnosis present

## 2023-01-20 DIAGNOSIS — E861 Hypovolemia: Secondary | ICD-10-CM | POA: Diagnosis present

## 2023-01-20 DIAGNOSIS — E860 Dehydration: Secondary | ICD-10-CM | POA: Diagnosis present

## 2023-01-20 DIAGNOSIS — Z833 Family history of diabetes mellitus: Secondary | ICD-10-CM | POA: Diagnosis not present

## 2023-01-20 DIAGNOSIS — W182XXA Fall in (into) shower or empty bathtub, initial encounter: Secondary | ICD-10-CM | POA: Diagnosis present

## 2023-01-20 DIAGNOSIS — S40011A Contusion of right shoulder, initial encounter: Secondary | ICD-10-CM | POA: Diagnosis present

## 2023-01-20 LAB — CBC
HCT: 34.4 % — ABNORMAL LOW (ref 36.0–46.0)
Hemoglobin: 10.9 g/dL — ABNORMAL LOW (ref 12.0–15.0)
MCH: 23.1 pg — ABNORMAL LOW (ref 26.0–34.0)
MCHC: 31.7 g/dL (ref 30.0–36.0)
MCV: 72.9 fL — ABNORMAL LOW (ref 80.0–100.0)
Platelets: 174 10*3/uL (ref 150–400)
RBC: 4.72 MIL/uL (ref 3.87–5.11)
RDW: 15.2 % (ref 11.5–15.5)
WBC: 9.1 10*3/uL (ref 4.0–10.5)
nRBC: 0 % (ref 0.0–0.2)

## 2023-01-20 LAB — GLUCOSE, CAPILLARY
Glucose-Capillary: 108 mg/dL — ABNORMAL HIGH (ref 70–99)
Glucose-Capillary: 133 mg/dL — ABNORMAL HIGH (ref 70–99)
Glucose-Capillary: 135 mg/dL — ABNORMAL HIGH (ref 70–99)
Glucose-Capillary: 140 mg/dL — ABNORMAL HIGH (ref 70–99)
Glucose-Capillary: 144 mg/dL — ABNORMAL HIGH (ref 70–99)
Glucose-Capillary: 147 mg/dL — ABNORMAL HIGH (ref 70–99)
Glucose-Capillary: 147 mg/dL — ABNORMAL HIGH (ref 70–99)
Glucose-Capillary: 148 mg/dL — ABNORMAL HIGH (ref 70–99)
Glucose-Capillary: 156 mg/dL — ABNORMAL HIGH (ref 70–99)
Glucose-Capillary: 174 mg/dL — ABNORMAL HIGH (ref 70–99)
Glucose-Capillary: 180 mg/dL — ABNORMAL HIGH (ref 70–99)
Glucose-Capillary: 236 mg/dL — ABNORMAL HIGH (ref 70–99)
Glucose-Capillary: 77 mg/dL (ref 70–99)

## 2023-01-20 LAB — COMPREHENSIVE METABOLIC PANEL
ALT: 22 U/L (ref 0–44)
AST: 70 U/L — ABNORMAL HIGH (ref 15–41)
Albumin: 3.5 g/dL (ref 3.5–5.0)
Alkaline Phosphatase: 68 U/L (ref 38–126)
Anion gap: 14 (ref 5–15)
BUN: 16 mg/dL (ref 8–23)
CO2: 23 mmol/L (ref 22–32)
Calcium: 8.6 mg/dL — ABNORMAL LOW (ref 8.9–10.3)
Chloride: 95 mmol/L — ABNORMAL LOW (ref 98–111)
Creatinine, Ser: 1.23 mg/dL — ABNORMAL HIGH (ref 0.44–1.00)
GFR, Estimated: 48 mL/min — ABNORMAL LOW (ref >60)
Glucose, Bld: 123 mg/dL — ABNORMAL HIGH (ref 70–99)
Potassium: 3.3 mmol/L — ABNORMAL LOW (ref 3.5–5.1)
Sodium: 132 mmol/L — ABNORMAL LOW (ref 135–145)
Total Bilirubin: 1 mg/dL (ref 0.3–1.2)
Total Protein: 6.8 g/dL (ref 6.5–8.1)

## 2023-01-20 LAB — PROCALCITONIN: Procalcitonin: <0.1 ng/mL

## 2023-01-20 LAB — CK: Total CK: 1685 U/L — ABNORMAL HIGH (ref 38–234)

## 2023-01-20 LAB — HIV ANTIBODY (ROUTINE TESTING W REFLEX): HIV Screen 4th Generation wRfx: NONREACTIVE

## 2023-01-20 MED ORDER — SODIUM CHLORIDE 0.9 % IV SOLN
500.0000 mg | INTRAVENOUS | Status: DC
  Filled 2023-01-20: qty 5

## 2023-01-20 MED ORDER — SODIUM CHLORIDE 0.9 % IV SOLN
INTRAVENOUS | Status: DC
  Administered 2023-01-22: 100 mL/h via INTRAVENOUS

## 2023-01-20 MED ORDER — SODIUM CHLORIDE 0.9 % IV SOLN: 2.0000 g | INTRAVENOUS | Status: DC

## 2023-01-20 MED ORDER — GADOBUTROL 1 MMOL/ML IV SOLN
5.0000 mL | Freq: Once | INTRAVENOUS | Status: AC | PRN
  Administered 2023-01-20: 5 mL via INTRAVENOUS

## 2023-01-20 MED ORDER — POTASSIUM CHLORIDE CRYS ER 20 MEQ PO TBCR
20.0000 meq | EXTENDED_RELEASE_TABLET | Freq: Two times a day (BID) | ORAL | Status: AC
  Administered 2023-01-20 – 2023-01-21 (×4): 20 meq via ORAL
  Filled 2023-01-20 (×4): qty 1

## 2023-01-20 NOTE — Evaluation (Signed)
Physical Therapy Evaluation Patient Details Name: [name] MRN: [id number] DOB: 10-09-1956 Today's Date: 01/20/2023  History of Present Illness  Pt is a 67 yo female admitted after being found unresponsive in her tub by her son.  Pt found to be hypoglycemic and hypotensive with encephalopathy possibly from hypoglycemia. PMH: CKD III, scelerotic bone lesion, alpha thalassemia.  Clinical Impression   Pt presents with generalized weakness, impaired balance requiring use of AD when pt is typically independent, and impaired activity tolerance vs baseline. Pt to benefit from acute PT to address deficits. Pt ambulated hallway distance with use of RW, occasionally requiring light steadying assist during mobility. Pt is independent at baseline, recommending HHPT and increased support from family at d/c to return to [facility]. PT to progress mobility as tolerated, and will continue to follow acutely.         Recommendations for follow up therapy are one component of a multi-disciplinary discharge planning process, led by the attending physician.  Recommendations may be updated based on patient status, additional functional criteria and insurance authorization.  Follow Up Recommendations Home health PT      Assistance Recommended at Discharge Frequent or constant Supervision/Assistance  Patient can return home with the following  A little help with walking and/or transfers;A little help with bathing/dressing/bathroom    Equipment Recommendations Rolling walker (2 wheels)  Recommendations for Other Services       Functional Status Assessment Patient has had a recent decline in their functional status and demonstrates the ability to make significant improvements in function in a reasonable and predictable amount of time.     Precautions / Restrictions Precautions Precautions: Fall Restrictions Weight Bearing Restrictions: No      Mobility  Bed Mobility Overal bed mobility: Needs Assistance              General bed mobility comments: up in chair    Transfers Overall transfer level: Needs assistance Equipment used: 1 person hand held assist Transfers: Sit to/from Stand Sit to Stand: Min guard           General transfer comment: close guard for safety only    Ambulation/Gait Ambulation/Gait assistance: Min guard, Min assist Gait Distance (Feet): 400 Feet Assistive device: Rolling walker (2 wheels), IV Pole Gait Pattern/deviations: Step-through pattern, Decreased stride length, Trunk flexed Gait velocity: decr     General Gait Details: initially light steadying assist with obstacles in hallway, using IV pole. PT transitioned pt to RW, improving to close guard for safety and cues for form/safety with use of RW  Stairs            Wheelchair Mobility    Modified Rankin (Stroke Patients Only)       Balance Overall balance assessment: Needs assistance Sitting-balance support: Feet supported Sitting balance-Leahy Scale: Fair   Postural control: Right lateral lean Standing balance support: Single extremity supported, During functional activity Standing balance-Leahy Scale: Poor Standing balance comment: reliant on external assist                             Pertinent Vitals/Pain      Home Living Family/patient expects to be discharged to:: Private residence Living Arrangements: Children Available Help at Discharge: Family;Available PRN/intermittently Type of Home: House Home Access: Stairs to enter   Entrance Stairs-Number of Steps: 3   Home Layout: Two level;Able to live on main level with bedroom/bathroom Home Equipment: None      Prior Function  Prior Level of Function : Independent/Modified Independent;Driving;Working/employed             Mobility Comments: Pt does not use assitive device ADLs Comments: independent     Hand Dominance   Dominant Hand: Right    Extremity/Trunk Assessment   Upper Extremity  Assessment Upper Extremity Assessment: Defer to OT evaluation    Lower Extremity Assessment Lower Extremity Assessment: Generalized weakness    Cervical / Trunk Assessment Cervical / Trunk Assessment: Normal  Communication   Communication: No difficulties  Cognition Arousal/Alertness: Awake/alert Behavior During Therapy: Flat affect Overall Cognitive Status: Impaired/Different from baseline Area of Impairment: Awareness, Problem solving, Memory                     Memory: Decreased short-term memory     Awareness: Emergent Problem Solving: Slow processing, Requires verbal cues General Comments: increased processing time, pt states multiple times during session "I don't know how I got in the tub" emphasizing she does not recall event that brought her to hospital        General Comments General comments (skin integrity, edema, etc.): Pt limited by slow processing and problem solving as well as decreased balance.    Exercises     Assessment/Plan    PT Assessment Patient needs continued PT services  PT Problem List Decreased strength;Decreased mobility;Decreased activity tolerance;Decreased balance;Decreased knowledge of use of DME;Decreased safety awareness;Decreased knowledge of precautions       PT Treatment Interventions DME instruction;Therapeutic activities;Gait training;Therapeutic exercise;Patient/family education;Balance training;Stair training;Functional mobility training;Neuromuscular re-education    PT Goals (Current goals can be found in the Care Plan section)  Acute Rehab PT Goals PT Goal Formulation: With patient Time For Goal Achievement: 02/03/23 Potential to Achieve Goals: Good    Frequency Min 3X/week     Co-evaluation               AM-PAC PT "6 Clicks" Mobility  Outcome Measure Help needed turning from your back to your side while in a flat bed without using bedrails?: A Little Help needed moving from lying on your back to sitting  on the side of a flat bed without using bedrails?: A Little Help needed moving to and from a bed to a chair (including a wheelchair)?: A Little Help needed standing up from a chair using your arms (e.g., wheelchair or bedside chair)?: A Little Help needed to walk in hospital room?: A Little Help needed climbing 3-5 steps with a railing? : A Little 6 Click Score: 18    End of Session   Activity Tolerance: Patient tolerated treatment well Patient left: in chair;with call bell/phone within reach;with chair alarm set;with family/visitor present Nurse Communication: Mobility status PT Visit Diagnosis: Other abnormalities of gait and mobility (R26.89)    Time: 4696-2952 PT Time Calculation (min) (ACUTE ONLY): 22 min   Charges:   PT Evaluation $PT Eval Low Complexity: 1 Low         Mable Dara S, PT DPT Acute Rehabilitation Services Pager [phone]  Office [phone]   Roxine Caddy E Ruffin Pyo 01/20/2023, 2:58 PM

## 2023-01-20 NOTE — Progress Notes (Signed)
Patient arrived at the unit,CHG bath given,CCMD notified,Vitals taken,patient oriented to the unit,no complaints of pain.provider notified regarding bruising on rt posterior arm with deformity, x ray has been ordered,will continue to monitor

## 2023-01-20 NOTE — Evaluation (Signed)
Occupational Therapy Evaluation Patient Details Name: [name] MRN: [id number] DOB: 1956/06/04 Today's Date: 01/20/2023   History of Present Illness Pt is a 67 yo female admitted after being found unresponsive in her tub by her son.  Pt found to be hypoglycemic and hypotensive with encephalopathy possibly from hypoglycemia. PMH: CKD III, scelerotic bone lesion, alpha thalassemia.   Clinical Impression   Pt admitted with the above diagnosis and has the deficits listed below. Pt would benefit from cont OT to assist pt in being safe and independent with basic adls so she can d/c back home. Pt lives with son who is often times not home. Pt demonstrating some slow processing time this am as well as poor memory.  Pt works full time at [employer] and drives herself there.  If pt is to d/c home soon, pt will need 24/7 supervision at home until cognitive returns to baseline.  If not assist at home, feel pt may need SNF but feet if she improves pt could go home with son. Will cont to see and assess cognition further.       Recommendations for follow up therapy are one component of a multi-disciplinary discharge planning process, led by the attending physician.  Recommendations may be updated based on patient status, additional functional criteria and insurance authorization.   Follow Up Recommendations  Home health OT     Assistance Recommended at Discharge Frequent or constant Supervision/Assistance  Patient can return home with the following A little help with walking and/or transfers;A little help with bathing/dressing/bathroom;Assistance with cooking/housework;Direct supervision/assist for medications management;Assist for transportation;Direct supervision/assist for financial management;Help with stairs or ramp for entrance    Functional Status Assessment  Patient has had a recent decline in their functional status and demonstrates the ability to make significant improvements in function in a  reasonable and predictable amount of time.  Equipment Recommendations  None recommended by OT (tbd)    Recommendations for Other Services       Precautions / Restrictions Precautions Precautions: Fall Restrictions Weight Bearing Restrictions: No      Mobility Bed Mobility Overal bed mobility: Needs Assistance Bed Mobility: Supine to Sit     Supine to sit: Min assist, HOB elevated     General bed mobility comments: used bed rails.    Transfers Overall transfer level: Needs assistance Equipment used: 1 person hand held assist Transfers: Sit to/from Stand, Bed to chair/wheelchair/BSC Sit to Stand: Min assist     Step pivot transfers: Min assist     General transfer comment: posterior lean. cues for hand placement as pt flopped back into recliner chair      Balance Overall balance assessment: Needs assistance Sitting-balance support: Feet supported Sitting balance-Leahy Scale: Fair Sitting balance - Comments: Pt with some mild LOB on EOB while donning socks. Postural control: Right lateral lean Standing balance support: Single extremity supported, During functional activity Standing balance-Leahy Scale: Poor Standing balance comment: Pt required outside assist to stand of one person or the IV pole                           ADL either performed or assessed with clinical judgement   ADL Overall ADL's : Needs assistance/impaired Eating/Feeding: Set up;Sitting   Grooming: Wash/dry hands;Wash/dry face;Oral care;Supervision/safety;Standing Grooming Details (indicate cue type and reason): Pt stood at sink with supervision propping on sink Upper Body Bathing: Set up;Sitting   Lower Body Bathing: Minimal assistance;Sit to/from stand;Cueing for compensatory  techniques   Upper Body Dressing : Set up;Sitting   Lower Body Dressing: Minimal assistance;Sit to/from stand;Cueing for compensatory techniques Lower Body Dressing Details (indicate cue type and  reason): Pt with slight posterior LOB when standing and pt required min assist with balance to donn socks. Toilet Transfer: Minimal assistance;Ambulation Toilet Transfer Details (indicate cue type and reason): Pt walked with hand held assist/min assist Toileting- Clothing Manipulation and Hygiene: Sit to/from stand;Cueing for compensatory techniques;Min guard       Functional mobility during ADLs: Minimal assistance General ADL Comments: Pt required assist initially with sitting and standing balance. Appeared to improve as session when on     Vision Baseline Vision/History: 0 No visual deficits Ability to See in Adequate Light: 0 Adequate Patient Visual Report: No change from baseline Vision Assessment?: No apparent visual deficits     Perception Perception Perception Tested?: No   Praxis Praxis Praxis tested?: Within functional limits    Pertinent Vitals/Pain Pain Assessment Pain Assessment: No/denies pain     Hand Dominance Right   Extremity/Trunk Assessment Upper Extremity Assessment Upper Extremity Assessment: Overall WFL for tasks assessed   Lower Extremity Assessment Lower Extremity Assessment: Defer to PT evaluation   Cervical / Trunk Assessment Cervical / Trunk Assessment: Normal   Communication Communication Communication: No difficulties   Cognition Arousal/Alertness: Awake/alert Behavior During Therapy: Flat affect Overall Cognitive Status: Impaired/Different from baseline Area of Impairment: Awareness, Problem solving, Memory                     Memory: Decreased short-term memory     Awareness: Emergent Problem Solving: Slow processing, Requires verbal cues General Comments: Pt with flat affect, slow to respond to all questions and requests.     General Comments  Pt limited by slow processing and problem solving as well as decreased balance.    Exercises     Shoulder Instructions      Home Living Family/patient expects to be  discharged to:: Private residence Living Arrangements: Children Available Help at Discharge: Family;Available PRN/intermittently Type of Home: House Home Access: Stairs to enter Entrance Stairs-Number of Steps: 3   Home Layout: Two level;Able to live on main level with bedroom/bathroom     Bathroom Shower/Tub: Tub/shower unit;Curtain   Bathroom Toilet: Standard     Home Equipment: None          Prior Functioning/Environment Prior Level of Function : Independent/Modified Independent;Driving;Working/employed             Mobility Comments: Pt does not use assitive device ADLs Comments: independent        OT Problem List: Impaired balance (sitting and/or standing);Decreased cognition      OT Treatment/Interventions: Self-care/ADL training;Cognitive remediation/compensation;Balance training;DME and/or AE instruction    OT Goals(Current goals can be found in the care plan section) Acute Rehab OT Goals Patient Stated Goal: to get well OT Goal Formulation: With patient Time For Goal Achievement: 02/03/23 Potential to Achieve Goals: Good ADL Goals Pt Will Perform Grooming: with modified independence;standing Pt Will Perform Tub/Shower Transfer: Tub transfer;ambulating;with supervision Additional ADL Goal #1: PT will walk to bathroom and complete all toileting with mod I Additional ADL Goal #2: Pt will gather all clothing and dress self with mod I Additional ADL Goal #3: Pt will compete basic cognitive assessment to get baseline cognitive level to assist with goal setting.  OT Frequency: Min 2X/week    Co-evaluation              AM-PAC  OT "6 Clicks" Daily Activity     Outcome Measure Help from another person eating meals?: None Help from another person taking care of personal grooming?: None Help from another person toileting, which includes using toliet, bedpan, or urinal?: A Little Help from another person bathing (including washing, rinsing, drying)?: A  Little Help from another person to put on and taking off regular upper body clothing?: None Help from another person to put on and taking off regular lower body clothing?: A Little 6 Click Score: 21   End of Session Equipment Utilized During Treatment: Gait belt Nurse Communication: Mobility status  Activity Tolerance: Patient tolerated treatment well Patient left: in chair;with call bell/phone within reach;with chair alarm set;with nursing/sitter in room  OT Visit Diagnosis: Unsteadiness on feet (R26.81);Other symptoms and signs involving cognitive function                Time: 4196-2229 OT Time Calculation (min): 25 min Charges:  OT General Charges $OT Visit: 1 Visit OT Evaluation $OT Eval Low Complexity: 1 Low OT Treatments $Self Care/Home Management : 8-22 mins  Glenford Peers 01/20/2023, 12:29 PM

## 2023-01-20 NOTE — ED Notes (Signed)
Enroute to floor bed, this RN noted round bruising on posterior right shoulder, as well as mild deformity. PT denies pain and/or any limitation of ROM. Receiving RN made aware and will make provider aware

## 2023-01-20 NOTE — Plan of Care (Signed)

## 2023-01-20 NOTE — Progress Notes (Signed)
PROGRESS NOTE    [name]  QAS:[id number] DOB: 22-Dec-1955 DOA: 01/19/2023 PCP: Silverio Decamp, MD    Brief Narrative:  67 year old with history of essential hypertension, alpha thalassemia, sclerotic bone lesion of unclear etiology currently not on any treatment at home brought to the emergency room with unresponsiveness from home.  Reportedly patient was having some weakness and fatigue for the last few days but generally doing okay.  Son saw her at 8 PM the night before.  He went back home after 4 PM next day, did not find any response at home.  He had to knock down the door to get entry and found her unresponsive on the floor in the shower.  EMS was called.  She was found cold, low blood pressures and blood sugars of 30.  Brought to the emergency room resuscitated with IV fluids and dextrose.  Also given dose of steroids.  Clinically stabilizing.  Patient was admitted to the hospital June 2023 with similar complaints, hypoglycemia and dehydration.  She had extensive workup later on with oncology clinic and no metastatic disease was found.  Biopsies were inconclusive.  Followed by Dr. Marin Olp.    Assessment & Plan:   Hypoglycemia: Primary cause unknown.  Likely related to poor oral intake. - Currently not on any medications at home, family to look for any over-the-counter medications or inadvertent use of any medications at home. -She had normal cortisol and TSH -Check sulfonylurea panel.  Discontinue maintenance dextrose, allow regular diet.  Discontinue steroids.  Will check insulin levels and C-peptide blood sugar was less than 70. -Nuclear medicine bone scan 8/23 without any evidence of metastatic disease. -PET scan 06/2022 without any evidence of hypermetabolic lesion on the chest or abdomen.  Unlikely insulinoma.  Acute metabolic encephalopathy secondary hypoglycemia.  Improving.  CT head and MRI of the brain without any acute findings.  Mental status is improving.  Work  with PT OT.  Hypothermia and hypovolemia: Probably environmental.  Temperature adequately improved.  Blood pressures improved.  CKD stage IIIa: Probably has underlying hypertensive kidney disease.  Remains overall stable.  Continue maintenance IV fluids.   DVT prophylaxis: enoxaparin (LOVENOX) injection 40 mg Start: 01/20/23 1400   Code Status: Full code Family Communication: Patient's son on the phone Disposition Plan: Status is: Observation The patient will require care spanning > 2 midnights and should be moved to inpatient because: Significant hyperglycemia, multiple investigations.  IV fluid.     Consultants:  None.  Procedures:  None  Antimicrobials:  Discontinued   Subjective: Patient seen in the morning rounds.  She denies any complaints.  Poor historian overall.  Patient tells me that she does not remember any events.  She working a Marine scientist .  Patient does not remember driving back home Friday after work. Called and discussed with patient's son.  He is going to check in their house to see if she has any medications that she might have been inadvertently taking.  Objective: Vitals:   01/19/23 2328 01/20/23 0158 01/20/23 0330 01/20/23 0744  BP:  134/88  113/76  Pulse:  74  75  Resp:  20  (!) 21  Temp: 98.2 F (36.8 C) 98.2 F (36.8 C) 98.2 F (36.8 C) 98.4 F (36.9 C)  TempSrc: Rectal Oral Oral Oral  SpO2:  100%  100%    Intake/Output Summary (Last 24 hours) at 01/20/2023 1104 Last data filed at 01/20/2023 1000 Gross per 24 hour  Intake 2730.7 ml  Output 700 ml  Net 2030.7 ml   There were no vitals filed for this visit.  Examination:  General exam: Appears calm and comfortable  Alert and awake.  Oriented 3-4.  Flat affect.  Otherwise interactive.  Slow to respond. Respiratory system: Clear to auscultation. Respiratory effort normal. Cardiovascular system: S1 & S2 heard, RRR. No JVD, murmurs, rubs, gallops or clicks. No pedal edema. Gastrointestinal  system: Abdomen is nondistended, soft and nontender. No organomegaly or masses felt. Normal bowel sounds heard.    Data Reviewed: I have personally reviewed following labs and imaging studies  CBC: Recent Labs  Lab 01/19/23 1751 01/19/23 1833 01/19/23 2050 01/20/23 0437  WBC 4.3  --   --  9.1  NEUTROABS 3.5  --   --   --   HGB 10.4* 11.2*  --  10.9*  HCT 34.8* 33.0*  --  34.4*  MCV 76.8*  --   --  72.9*  PLT 123*  --  146* 193   Basic Metabolic Panel: Recent Labs  Lab 01/19/23 1833 01/19/23 2030 01/20/23 0437  NA 133* 132* 132*  K 3.5 3.7 3.3*  CL  --  98 95*  CO2  --  24 23  GLUCOSE  --  186* 123*  BUN  --  15 16  CREATININE  --  1.02* 1.23*  CALCIUM  --  8.7* 8.6*   GFR: CrCl cannot be calculated (Unknown ideal weight.). Liver Function Tests: Recent Labs  Lab 01/19/23 2030 01/20/23 0437  AST 67* 70*  ALT 18 22  ALKPHOS 69 68  BILITOT 0.8 1.0  PROT 6.4* 6.8  ALBUMIN 3.2* 3.5   No results for input(s): "LIPASE", "AMYLASE" in the last 168 hours. No results for input(s): "AMMONIA" in the last 168 hours. Coagulation Profile: Recent Labs  Lab 01/19/23 1751 01/19/23 2050  INR 1.2 1.3*   Cardiac Enzymes: Recent Labs  Lab 01/19/23 2030 01/20/23 0437  CKTOTAL 1,838* 1,685*   BNP (last 3 results) No results for input(s): "PROBNP" in the last 8760 hours. HbA1C: No results for input(s): "HGBA1C" in the last 72 hours. CBG: Recent Labs  Lab 01/20/23 0536 01/20/23 0614 01/20/23 0737 01/20/23 0912 01/20/23 1054  GLUCAP 180* 148* 147* 133* 108*   Lipid Profile: No results for input(s): "CHOL", "HDL", "LDLCALC", "TRIG", "CHOLHDL", "LDLDIRECT" in the last 72 hours. Thyroid Function Tests: Recent Labs    01/19/23 1751 01/19/23 1752  TSH  --  2.053  FREET4 0.49*  --    Anemia Panel: No results for input(s): "VITAMINB12", "FOLATE", "FERRITIN", "TIBC", "IRON", "RETICCTPCT" in the last 72 hours. Sepsis Labs: Recent Labs  Lab 01/19/23 1751  01/19/23 2030 01/19/23 2050  PROCALCITON  --  <0.10  --   LATICACIDVEN 3.4*  --  3.3*    Recent Results (from the past 240 hour(s))  Resp panel by RT-PCR (RSV, Flu A&B, Covid) Anterior Nasal Swab     Status: None   Collection Time: 01/19/23  5:52 PM   Specimen: Anterior Nasal Swab  Result Value Ref Range Status   SARS Coronavirus 2 by RT PCR NEGATIVE NEGATIVE Final   Influenza A by PCR NEGATIVE NEGATIVE Final   Influenza B by PCR NEGATIVE NEGATIVE Final    Comment: (NOTE) The Xpert Xpress SARS-CoV-2/FLU/RSV plus assay is intended as an aid in the diagnosis of influenza from Nasopharyngeal swab specimens and should not be used as a sole basis for treatment. Nasal washings and aspirates are unacceptable for Xpert Xpress SARS-CoV-2/FLU/RSV testing.  Fact Sheet for Patients:  EntrepreneurPulse.com.au  Fact Sheet for Healthcare Providers: IncredibleEmployment.be  This test is not yet approved or cleared by the [place] FDA and has been authorized for detection and/or diagnosis of SARS-CoV-2 by FDA under an Emergency Use Authorization (EUA). This EUA will remain in effect (meaning this test can be used) for the duration of the COVID-19 declaration under Section 564(b)(1) of the Act, 21 U.S.C. section 360bbb-3(b)(1), unless the authorization is terminated or revoked.     Resp Syncytial Virus by PCR NEGATIVE NEGATIVE Final    Comment: (NOTE) Fact Sheet for Patients: EntrepreneurPulse.com.au  Fact Sheet for Healthcare Providers: IncredibleEmployment.be  This test is not yet approved or cleared by the [place] FDA and has been authorized for detection and/or diagnosis of SARS-CoV-2 by FDA under an Emergency Use Authorization (EUA). This EUA will remain in effect (meaning this test can be used) for the duration of the COVID-19 declaration under Section 564(b)(1) of the Act, 21 U.S.C. section  360bbb-3(b)(1), unless the authorization is terminated or revoked.  Performed at [facility], [place] [address], [place], [place] [address]   Blood Culture (routine x 2)     Status: None (Preliminary result)   Collection Time: 01/19/23  6:24 PM   Specimen: BLOOD RIGHT HAND  Result Value Ref Range Status   Specimen Description BLOOD RIGHT HAND  Final   Special Requests   Final    BOTTLES DRAWN AEROBIC AND ANAEROBIC Blood Culture adequate volume   Culture   Final    NO GROWTH < 24 HOURS Performed at [facility], [place] [address] [place], [place] [address]    Report Status PENDING  Incomplete         Radiology Studies: DG Shoulder Right  Result Date: 01/20/2023 CLINICAL DATA:  Bruising. EXAM: RIGHT SHOULDER - 2+ VIEW COMPARISON:  None Available. FINDINGS: There is no evidence of fracture or dislocation. Degenerative changes are present at the acromioclavicular joint. Soft tissues are unremarkable. IMPRESSION: No acute fracture or dislocation. Electronically Signed   By: Brett Fairy M.D.   On: 01/20/2023 02:33   MR BRAIN W WO CONTRAST  Result Date: 01/20/2023 CLINICAL DATA:  Seizure EXAM: MRI HEAD WITHOUT AND WITH CONTRAST TECHNIQUE: Multiplanar, multiecho pulse sequences of the brain and surrounding structures were obtained without and with intravenous contrast. CONTRAST:  42m GADAVIST GADOBUTROL 1 MMOL/ML IV SOLN COMPARISON:  None Available. FINDINGS: Brain: No acute infarct, mass effect or extra-axial collection. Multiple chronic microhemorrhages in a predominantly central distribution. There is multifocal hyperintense T2-weighted signal within the white matter. Generalized volume loss. A partially empty sella is incidentally noted. Old right basal ganglia small vessel infarct. There is no abnormal contrast enhancement. Vascular: Normal flow voids. Skull and upper cervical spine: Normal marrow signal. Sinuses/Orbits: Negative. Other: None. IMPRESSION: 1. No acute intracranial  abnormality. 2. Multiple chronic microhemorrhages in a predominantly central distribution, consistent with chronic hypertensive angiopathy. 3. Old right basal ganglia small vessel infarct. Electronically Signed   By: KUlyses JarredM.D.   On: 01/20/2023 00:39   CT Cervical Spine Wo Contrast  Result Date: 01/19/2023 CLINICAL DATA:  Patient was found on the floor unresponsive. Possible seizure. EXAM: CT CERVICAL SPINE WITHOUT CONTRAST TECHNIQUE: Multidetector CT imaging of the cervical spine was performed without intravenous contrast. Multiplanar CT image reconstructions were also generated. RADIATION DOSE REDUCTION: This exam was performed according to the departmental dose-optimization program which includes automated exposure control, adjustment of the mA and/or kV according to patient size and/or use  of iterative reconstruction technique. COMPARISON:  None Available. FINDINGS: Alignment: Straightening of usual cervical lordosis is likely positional but could indicate muscle spasm. No anterior subluxations. Normal alignment of the posterior elements. C1-2 articulation appears intact. Skull base and vertebrae: Skull base appears intact. No vertebral compression deformities. Focal areas of sclerosis demonstrated in the T3 vertebra, incompletely included within the field of view. Possibly sclerotic metastasis. Soft tissues and spinal canal: No prevertebral soft tissue swelling. No abnormal paraspinal soft tissue mass or infiltration. Disc levels: Degenerative changes with disc space narrowing and endplate osteophyte formation, most prominent at C5-6 and C6-7 levels. Upper chest: Patchy ground-glass infiltrates in the right lung apex may represent pneumonia. Aspiration would be a possibility in this clinical scenario. Other: None. IMPRESSION: 1. Nonspecific straightening of usual cervical lordosis. No acute displaced fractures identified. 2. Degenerative changes in the cervical spine. 3. Sclerotic foci demonstrated  in the T3 vertebra, incompletely included, nonspecific but possibly metastatic disease. Clinical correlation is suggested. 4. Ground-glass infiltrates seen in the right lung apex likely due to pneumonia. Aspiration would be possible in this clinical setting. Electronically Signed   By: Lucienne Capers M.D.   On: 01/19/2023 19:33   CT Head Wo Contrast  Result Date: 01/19/2023 CLINICAL DATA:  Head trauma with possible seizure. Patient was found on the floor unresponsive. EXAM: CT HEAD WITHOUT CONTRAST TECHNIQUE: Contiguous axial images were obtained from the base of the skull through the vertex without intravenous contrast. RADIATION DOSE REDUCTION: This exam was performed according to the departmental dose-optimization program which includes automated exposure control, adjustment of the mA and/or kV according to patient size and/or use of iterative reconstruction technique. COMPARISON:  05/23/2022 FINDINGS: Brain: No evidence of acute infarction, hemorrhage, hydrocephalus, extra-axial collection or mass lesion/mass effect. Diffuse cerebral atrophy. Ventricular dilatation likely due to central atrophy. Patchy low-attenuation changes in the deep white matter consistent small vessel ischemia. Empty sella appearance. Vascular: Intracranial arterial calcifications. Skull: Calvarium appears intact. Sinuses/Orbits: Paranasal sinuses and mastoid air cells are clear. Other: None. IMPRESSION: No acute intracranial abnormalities. Chronic atrophy and small vessel ischemic changes. Electronically Signed   By: Lucienne Capers M.D.   On: 01/19/2023 19:29   DG Chest Port 1 View  Result Date: 01/19/2023 CLINICAL DATA:  Sepsis EXAM: PORTABLE CHEST 1 VIEW COMPARISON:  05/23/2022 FINDINGS: The heart size and mediastinal contours are within normal limits. Both lungs are clear. The visualized skeletal structures are unremarkable. IMPRESSION: No active disease. Electronically Signed   By: Randa Ngo M.D.   On: 01/19/2023 18:32         Scheduled Meds:  enoxaparin (LOVENOX) injection  40 mg Subcutaneous Q24H   Continuous Infusions:  sodium chloride 100 mL/hr at 01/20/23 0935     LOS: 0 days    Time spent: 35 minutes    Barb Merino, MD [facility] Pager [phone]

## 2023-01-20 NOTE — ED Notes (Signed)
ED TO INPATIENT HANDOFF REPORT  ED Nurse Name and Phone #: Cammie Mcgee # [phone]  S Name/Age/Gender [name] 67 y.o. female Room/Bed: TRAAC/TRAAC  Code Status   Code Status: Full Code  Home/SNF/Other Home Patient oriented to: self, place, time, and situation Is this baseline? Yes   Triage Complete: Triage complete  Chief Complaint Hypoglycemia [E16.2]  Triage Note Pt arrived via [facility] from home. Per EMS, pt's son found pt on floor unresponsive. Per EMS, pt initial glucose was 30 then went to 159 then went to 137. EMS gave D10 25g. Pt is A&Ox4. Pt has a bruise on right side of tongue like she bit her tongue. Pt is soaking wet. Pt's clothes are soaking wet.    Allergies Allergies  Allergen Reactions   Isovue [Iopamidol] Hives    Pt broke out in several facial hives, one on her back.  She will need full premeds in the future.  J Bohm No allergy demonstrated to  Orally ingested Iodinated agent. (Gastrographin CT Scan 05-12-16).   Iodine-131 Rash    Level of Care/Admitting Diagnosis ED Disposition     ED Disposition  Admit   Condition  --   Comment  Hospital Area: [place] [[id number]]  Level of Care: Progressive [102]  Admit to Progressive based on following criteria: MULTISYSTEM THREATS such as stable sepsis, metabolic/electrolyte imbalance with or without encephalopathy that is responding to early treatment.  Admit to Progressive based on following criteria: GI, ENDOCRINE disease patients with GI bleeding, acute liver failure or pancreatitis, stable with diabetic ketoacidosis or thyrotoxicosis (hypothyroid) state.  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   Hyperlipidemia    Hypertension    Past Surgical History:  Procedure Laterality Date   CHOLECYSTECTOMY N/A 05/21/2016   Procedure: LAPAROSCOPIC CHOLECYSTECTOMY;  Surgeon: Erroll Luna, MD;  Location: [place];  Service: General;  Laterality: N/A;   COLONOSCOPY     GIVENS CAPSULE STUDY N/A 07/22/2016   Procedure: GIVENS CAPSULE STUDY;  Surgeon: Carol Ada, MD;  Location: [place];  Service: Endoscopy;  Laterality: N/A;     A IV Location/Drains/Wounds Patient Lines/Drains/Airways Status     Active Line/Drains/Airways     Name Placement date Placement time Site Days   Peripheral IV 01/19/23 20 G Left Antecubital 01/19/23  --  Antecubital  1   Peripheral IV 01/19/23 22 G Anterior;Right Forearm 01/19/23  1811  Forearm  1   Peripheral IV 01/19/23 Posterior;Proximal;Right Forearm 01/19/23  2031  Forearm  1   External Urinary Catheter 05/24/22  0145  --  241            Intake/Output Last 24 hours  Intake/Output Summary (Last 24 hours) at 01/20/2023 0024 Last data filed at 01/19/2023 2340 Gross per 24 hour  Intake 2455.84 ml  Output 500 ml  Net 1955.84 ml    Labs/Imaging Results for orders placed or performed during the hospital encounter of 01/19/23 (from the past 48 hour(s))  CBG monitoring, ED  Status: Abnormal   Collection Time: 01/19/23  5:45 PM  Result Value Ref Range   Glucose-Capillary 108 (H) 70 - 99 mg/dL    Comment: Glucose reference range applies only to samples taken after fasting for at least 8 hours.  Lactic acid, plasma     Status: Abnormal   Collection Time: 01/19/23  5:51 PM  Result Value Ref Range   Lactic Acid, Venous 3.4 (HH) 0.5 - 1.9 mmol/L    Comment: CRITICAL RESULT CALLED TO, READ BACK BY AND VERIFIED WITH S,Syeda Prickett RN '@[id number]'$  01/19/23 E,[place] Performed at  [facility], [place] [address], [place], [place] [address]   CBC with Differential     Status: Abnormal   Collection Time: 01/19/23  5:51 PM  Result Value Ref Range   WBC 4.3 4.0 - 10.5 K/uL   RBC 4.53 3.87 - 5.11 MIL/uL   Hemoglobin 10.4 (L) 12.0 - 15.0 g/dL   HCT 34.8 (L) 36.0 - 46.0 %   MCV 76.8 (L) 80.0 - 100.0 fL   MCH 23.0 (L) 26.0 - 34.0 pg   MCHC 29.9 (L) 30.0 - 36.0 g/dL   RDW 15.5 11.5 - 15.5 %   Platelets 123 (L) 150 - 400 K/uL    Comment: REPEATED TO VERIFY   nRBC 0.0 0.0 - 0.2 %   Neutrophils Relative % 82 %   Neutro Abs 3.5 1.7 - 7.7 K/uL   Lymphocytes Relative 12 %   Lymphs Abs 0.5 (L) 0.7 - 4.0 K/uL   Monocytes Relative 5 %   Monocytes Absolute 0.2 0.1 - 1.0 K/uL   Eosinophils Relative 0 %   Eosinophils Absolute 0.0 0.0 - 0.5 K/uL   Basophils Relative 1 %   Basophils Absolute 0.0 0.0 - 0.1 K/uL   Immature Granulocytes 0 %   Abs Immature Granulocytes 0.01 0.00 - 0.07 K/uL    Comment: Performed at [facility], [place] [address], [place], [place] [address]  Protime-INR     Status: Abnormal   Collection Time: 01/19/23  5:51 PM  Result Value Ref Range   Prothrombin Time 15.4 (H) 11.4 - 15.2 seconds   INR 1.2 0.8 - 1.2    Comment: (NOTE) INR goal varies based on device and disease states. Performed at [facility], [place] [address], [place], [place] [address]   APTT     Status: Abnormal   Collection Time: 01/19/23  5:51 PM  Result Value Ref Range   aPTT 46 (H) 24 - 36 seconds    Comment:        IF BASELINE aPTT IS ELEVATED, SUGGEST PATIENT RISK ASSESSMENT BE USED TO DETERMINE APPROPRIATE ANTICOAGULANT THERAPY. Performed at [facility], [place] [address], [place], [place] [address]   Troponin I (High Sensitivity)     Status: None   Collection Time: 01/19/23  5:51 PM  Result Value Ref Range   Troponin I (High Sensitivity) 12 <18 ng/L    Comment: (NOTE) Elevated high sensitivity troponin I (hsTnI) values and significant  changes across  serial measurements may suggest ACS but many other  chronic and acute conditions are known to elevate hsTnI results.  Refer to the "Links" section for chest pain algorithms and additional  guidance. Performed at [facility], [place] [address], [place], [place] [address]   T4, free     Status: Abnormal   Collection Time: 01/19/23  5:51 PM  Result Value Ref Range   Free T4 0.49 (L)  0.61 - 1.12 ng/dL    Comment: (NOTE) Biotin ingestion may interfere with free T4 tests. If the results are inconsistent with the TSH level, previous test results, or the clinical presentation, then consider biotin interference. If needed, order repeat testing after stopping biotin. Performed at [facility], [place] [address], [place], [place] [address]   TSH     Status: None   Collection Time: 01/19/23  5:52 PM  Result Value Ref Range   TSH 2.053 0.350 - 4.500 uIU/mL    Comment: Performed by a 3rd Generation assay with a functional sensitivity of <=0.01 uIU/mL. Performed at [facility], [place] [address], [place], [place] [address]   Resp panel by RT-PCR (RSV, Flu A&B, Covid) Anterior Nasal Swab     Status: None   Collection Time: 01/19/23  5:52 PM   Specimen: Anterior Nasal Swab  Result Value Ref Range   SARS Coronavirus 2 by RT PCR NEGATIVE NEGATIVE   Influenza A by PCR NEGATIVE NEGATIVE   Influenza B by PCR NEGATIVE NEGATIVE    Comment: (NOTE) The Xpert Xpress SARS-CoV-2/FLU/RSV plus assay is intended as an aid in the diagnosis of influenza from Nasopharyngeal swab specimens and should not be used as a sole basis for treatment. Nasal washings and aspirates are unacceptable for Xpert Xpress SARS-CoV-2/FLU/RSV testing.  Fact Sheet for Patients: EntrepreneurPulse.com.au  Fact Sheet for Healthcare Providers: IncredibleEmployment.be  This test is not yet approved or cleared by the [place] FDA and has been authorized for detection and/or  diagnosis of SARS-CoV-2 by FDA under an Emergency Use Authorization (EUA). This EUA will remain in effect (meaning this test can be used) for the duration of the COVID-19 declaration under Section 564(b)(1) of the Act, 21 U.S.C. section 360bbb-3(b)(1), unless the authorization is terminated or revoked.     Resp Syncytial Virus by PCR NEGATIVE NEGATIVE    Comment: (NOTE) Fact Sheet for Patients: EntrepreneurPulse.com.au  Fact Sheet for Healthcare Providers: IncredibleEmployment.be  This test is not yet approved or cleared by the [place] FDA and has been authorized for detection and/or diagnosis of SARS-CoV-2 by FDA under an Emergency Use Authorization (EUA). This EUA will remain in effect (meaning this test can be used) for the duration of the COVID-19 declaration under Section 564(b)(1) of the Act, 21 U.S.C. section 360bbb-3(b)(1), unless the authorization is terminated or revoked.  Performed at [facility], [place] [address] [place], [place] [address]   CBG monitoring, ED     Status: Abnormal   Collection Time: 01/19/23  6:21 PM  Result Value Ref Range   Glucose-Capillary 327 (H) 70 - 99 mg/dL    Comment: Glucose reference range applies only to samples taken after fasting for at least 8 hours.  I-Stat venous blood gas, ED ([facility])     Status: Abnormal   Collection Time: 01/19/23  6:33 PM  Result Value Ref Range   pH, Ven 7.406 7.25 - 7.43   pCO2, Ven 35.6 (L) 44 - 60 mmHg   pO2, Ven 41 32 - 45 mmHg   Bicarbonate 22.4 20.0 - 28.0 mmol/L   TCO2 23 22 - 32 mmol/L   O2 Saturation 77 %   Acid-base deficit 2.0 0.0 - 2.0 mmol/L   Sodium 133 (L) 135 - 145 mmol/L   Potassium 3.5 3.5 - 5.1 mmol/L   Calcium, Ion 0.97 (L) 1.15 - 1.40 mmol/L   HCT 33.0 (L) 36.0 - 46.0 %   Hemoglobin 11.2 (L) 12.0 -  15.0 g/dL   Sample type VENOUS   POC CBG, ED     Status: Abnormal   Collection Time: 01/19/23  7:22 PM  Result Value Ref Range    Glucose-Capillary 269 (H) 70 - 99 mg/dL    Comment: Glucose reference range applies only to samples taken after fasting for at least 8 hours.  POC CBG, ED     Status: Abnormal   Collection Time: 01/19/23  8:22 PM  Result Value Ref Range   Glucose-Capillary 214 (H) 70 - 99 mg/dL    Comment: Glucose reference range applies only to samples taken after fasting for at least 8 hours.  CK     Status: Abnormal   Collection Time: 01/19/23  8:30 PM  Result Value Ref Range   Total CK 1,838 (H) 38 - 234 U/L    Comment: Performed at [facility], [place] [address], [place], [place] [address]  Comprehensive metabolic panel     Status: Abnormal   Collection Time: 01/19/23  8:30 PM  Result Value Ref Range   Sodium 132 (L) 135 - 145 mmol/L   Potassium 3.7 3.5 - 5.1 mmol/L   Chloride 98 98 - 111 mmol/L   CO2 24 22 - 32 mmol/L   Glucose, Bld 186 (H) 70 - 99 mg/dL    Comment: Glucose reference range applies only to samples taken after fasting for at least 8 hours.   BUN 15 8 - 23 mg/dL   Creatinine, Ser 1.02 (H) 0.44 - 1.00 mg/dL   Calcium 8.7 (L) 8.9 - 10.3 mg/dL   Total Protein 6.4 (L) 6.5 - 8.1 g/dL   Albumin 3.2 (L) 3.5 - 5.0 g/dL   AST 67 (H) 15 - 41 U/L   ALT 18 0 - 44 U/L   Alkaline Phosphatase 69 38 - 126 U/L   Total Bilirubin 0.8 0.3 - 1.2 mg/dL   GFR, Estimated 60 (L) >60 mL/min    Comment: (NOTE) Calculated using the CKD-EPI Creatinine Equation (2021)    Anion gap 10 5 - 15    Comment: Performed at [facility], [place] [address] [place], [place] [address]  Troponin I (High Sensitivity)     Status: Abnormal   Collection Time: 01/19/23  8:30 PM  Result Value Ref Range   Troponin I (High Sensitivity) 23 (H) <18 ng/L    Comment: (NOTE) Elevated high sensitivity troponin I (hsTnI) values and significant  changes across serial measurements may suggest ACS but many other  chronic and acute conditions are known to elevate hsTnI results.  Refer to the "Links" section for chest pain  algorithms and additional  guidance. Performed at [facility], [place] [address] [place], [place] [address]   Lactic acid, plasma     Status: Abnormal   Collection Time: 01/19/23  8:50 PM  Result Value Ref Range   Lactic Acid, Venous 3.3 (HH) 0.5 - 1.9 mmol/L    Comment: CRITICAL VALUE NOTED. VALUE IS CONSISTENT WITH PREVIOUSLY REPORTED/CALLED VALUE Performed at [facility], [place] [address], [place], [place] [address]   DIC Panel ONCE - STAT     Status: Abnormal   Collection Time: 01/19/23  8:50 PM  Result Value Ref Range   Prothrombin Time 15.7 (H) 11.4 - 15.2 seconds   INR 1.3 (H) 0.8 - 1.2    Comment: (NOTE) INR goal varies based on device and disease states.    aPTT 42 (H) 24 - 36 seconds  Comment:        IF BASELINE aPTT IS ELEVATED, SUGGEST PATIENT RISK ASSESSMENT BE USED TO DETERMINE APPROPRIATE ANTICOAGULANT THERAPY.    Fibrinogen 377 210 - 475 mg/dL    Comment: (NOTE) Fibrinogen results may be underestimated in patients receiving thrombolytic therapy.    D-Dimer, Quant 0.71 (H) 0.00 - 0.50 ug/mL-FEU    Comment: (NOTE) At the manufacturer cut-off value of 0.5 g/mL FEU, this assay has a negative predictive value of 95-100%.This assay is intended for use in conjunction with a clinical pretest probability (PTP) assessment model to exclude pulmonary embolism (PE) and deep venous thrombosis (DVT) in outpatients suspected of PE or DVT. Results should be correlated with clinical presentation.    Platelets 146 (L) 150 - 400 K/uL   Smear Review NO SCHISTOCYTES SEEN     Comment: Performed at [facility], [place] [address], [place], [place] [address]  Urinalysis, w/ Reflex to Culture (Infection Suspected) -Urine, Catheterized     Status: Abnormal   Collection Time: 01/19/23  9:30 PM  Result Value Ref Range   Specimen Source URINE, CATHETERIZED    Color, Urine STRAW (A) YELLOW   APPearance CLEAR CLEAR   Specific Gravity, Urine 1.006 1.005 - 1.030   pH  7.0 5.0 - 8.0   Glucose, UA >=500 (A) NEGATIVE mg/dL   Hgb urine dipstick MODERATE (A) NEGATIVE   Bilirubin Urine NEGATIVE NEGATIVE   Ketones, ur 20 (A) NEGATIVE mg/dL   Protein, ur NEGATIVE NEGATIVE mg/dL   Nitrite NEGATIVE NEGATIVE   Leukocytes,Ua NEGATIVE NEGATIVE   RBC / HPF 0-5 0 - 5 RBC/hpf   WBC, UA 0-5 0 - 5 WBC/hpf    Comment:        Reflex urine culture not performed if WBC <=10, OR if Squamous epithelial cells >5. If Squamous epithelial cells >5 suggest recollection.    Bacteria, UA NONE SEEN NONE SEEN   Squamous Epithelial / HPF 0-5 0 - 5 /HPF    Comment: Performed at [facility], [place] [address] [place], [place] [address]  POC CBG, ED     Status: None   Collection Time: 01/19/23 11:33 PM  Result Value Ref Range   Glucose-Capillary 99 70 - 99 mg/dL    Comment: Glucose reference range applies only to samples taken after fasting for at least 8 hours.   CT Cervical Spine Wo Contrast  Result Date: 01/19/2023 CLINICAL DATA:  Patient was found on the floor unresponsive. Possible seizure. EXAM: CT CERVICAL SPINE WITHOUT CONTRAST TECHNIQUE: Multidetector CT imaging of the cervical spine was performed without intravenous contrast. Multiplanar CT image reconstructions were also generated. RADIATION DOSE REDUCTION: This exam was performed according to the departmental dose-optimization program which includes automated exposure control, adjustment of the mA and/or kV according to patient size and/or use of iterative reconstruction technique. COMPARISON:  None Available. FINDINGS: Alignment: Straightening of usual cervical lordosis is likely positional but could indicate muscle spasm. No anterior subluxations. Normal alignment of the posterior elements. C1-2 articulation appears intact. Skull base and vertebrae: Skull base appears intact. No vertebral compression deformities. Focal areas of sclerosis demonstrated in the T3 vertebra, incompletely included within the field of view.  Possibly sclerotic metastasis. Soft tissues and spinal canal: No prevertebral soft tissue swelling. No abnormal paraspinal soft tissue mass or infiltration. Disc levels: Degenerative changes with disc space narrowing and endplate osteophyte formation, most prominent at C5-6 and C6-7 levels. Upper chest: Patchy ground-glass infiltrates in the right lung apex may  represent pneumonia. Aspiration would be a possibility in this clinical scenario. Other: None. IMPRESSION: 1. Nonspecific straightening of usual cervical lordosis. No acute displaced fractures identified. 2. Degenerative changes in the cervical spine. 3. Sclerotic foci demonstrated in the T3 vertebra, incompletely included, nonspecific but possibly metastatic disease. Clinical correlation is suggested. 4. Ground-glass infiltrates seen in the right lung apex likely due to pneumonia. Aspiration would be possible in this clinical setting. Electronically Signed   By: Lucienne Capers M.D.   On: 01/19/2023 19:33   CT Head Wo Contrast  Result Date: 01/19/2023 CLINICAL DATA:  Head trauma with possible seizure. Patient was found on the floor unresponsive. EXAM: CT HEAD WITHOUT CONTRAST TECHNIQUE: Contiguous axial images were obtained from the base of the skull through the vertex without intravenous contrast. RADIATION DOSE REDUCTION: This exam was performed according to the departmental dose-optimization program which includes automated exposure control, adjustment of the mA and/or kV according to patient size and/or use of iterative reconstruction technique. COMPARISON:  05/23/2022 FINDINGS: Brain: No evidence of acute infarction, hemorrhage, hydrocephalus, extra-axial collection or mass lesion/mass effect. Diffuse cerebral atrophy. Ventricular dilatation likely due to central atrophy. Patchy low-attenuation changes in the deep white matter consistent small vessel ischemia. Empty sella appearance. Vascular: Intracranial arterial calcifications. Skull: Calvarium  appears intact. Sinuses/Orbits: Paranasal sinuses and mastoid air cells are clear. Other: None. IMPRESSION: No acute intracranial abnormalities. Chronic atrophy and small vessel ischemic changes. Electronically Signed   By: Lucienne Capers M.D.   On: 01/19/2023 19:29   DG Chest Port 1 View  Result Date: 01/19/2023 CLINICAL DATA:  Sepsis EXAM: PORTABLE CHEST 1 VIEW COMPARISON:  05/23/2022 FINDINGS: The heart size and mediastinal contours are within normal limits. Both lungs are clear. The visualized skeletal structures are unremarkable. IMPRESSION: No active disease. Electronically Signed   By: Randa Ngo M.D.   On: 01/19/2023 18:32    Pending Labs Unresulted Labs (From admission, onward)     Start     Ordered   01/20/23 0500  CBC  Tomorrow morning,   R        01/19/23 2321   01/20/23 0500  Comprehensive metabolic panel  Tomorrow morning,   R        01/19/23 2321   01/20/23 0500  CK  Tomorrow morning,   R        01/19/23 2358   01/20/23 0005  HIV Antibody (routine testing w rflx)  (HIV Antibody (Routine testing w reflex) panel)  Once,   R        01/20/23 0004   01/19/23 2311  Procalcitonin - Baseline  ONCE - URGENT,   URGENT        01/19/23 2310   01/19/23 2030  Calcium, ionized  Once,   STAT        01/19/23 2029   01/19/23 1751  Blood Culture (routine x 2)  (Undifferentiated presentation (screening labs and basic nursing orders))  BLOOD CULTURE X 2,   STAT      01/19/23 1751            Vitals/Pain Today's Vitals   01/19/23 2145 01/19/23 2151 01/19/23 2315 01/19/23 2328  BP: 105/73  110/78   Pulse: 75  87   Resp: 20  (!) 24   Temp:    98.2 F (36.8 C)  TempSrc:    Rectal  SpO2: 100%  100%   PainSc:  0-No pain      Isolation Precautions Airborne and Contact precautions  Medications Medications  0.9 %  sodium chloride infusion (0 mLs Intravenous Paused 01/19/23 2348)  norepinephrine (LEVOPHED) '4mg'$  in 251m (0.016 mg/mL) premix infusion (0 mcg/min Intravenous Hold  01/19/23 2110)  hydrocortisone sodium succinate (SOLU-CORTEF) 100 MG injection 100 mg (has no administration in time range)  enoxaparin (LOVENOX) injection 40 mg (has no administration in time range)  acetaminophen (TYLENOL) tablet 650 mg (has no administration in time range)    Or  acetaminophen (TYLENOL) suppository 650 mg (has no administration in time range)  ondansetron (ZOFRAN) tablet 4 mg ( Oral See Alternative 01/19/23 2340)    Or  ondansetron (ZOFRAN) injection 4 mg (4 mg Intravenous Given 01/19/23 2340)  cefTRIAXone (ROCEPHIN) 2 g in sodium chloride 0.9 % 100 mL IVPB (has no administration in time range)  azithromycin (ZITHROMAX) 500 mg in sodium chloride 0.9 % 250 mL IVPB (has no administration in time range)  gadobutrol (GADAVIST) 1 MMOL/ML injection 5 mL (has no administration in time range)  lactated ringers bolus 1,000 mL (0 mLs Intravenous Stopped 01/19/23 1823)  dextrose 50 % solution 50 mL (50 mLs Intravenous Given 01/19/23 1754)  lactated ringers bolus 1,000 mL (0 mLs Intravenous Stopped 01/19/23 2110)  vancomycin (VANCOREADY) IVPB 1250 mg/250 mL (0 mg Intravenous Stopped 01/19/23 2340)  hydrocortisone sodium succinate (SOLU-CORTEF) 100 MG injection 100 mg (100 mg Intravenous Given 01/19/23 2035)  ceFEPIme (MAXIPIME) 2 g in sodium chloride 0.9 % 100 mL IVPB (0 g Intravenous Stopped 01/19/23 2148)  metroNIDAZOLE (FLAGYL) IVPB 500 mg (0 mg Intravenous Stopped 01/19/23 2317)    Mobility walks     Focused Assessments See charted assessment   R Recommendations: See Admitting Provider Note  Report given to:   Additional Notes: Pt is currently in MRI

## 2023-01-20 NOTE — Progress Notes (Signed)
Getting X ray for bruising of shoulder.  Pt denies pain, no limitation to range of motion it seems.

## 2023-01-21 ENCOUNTER — Inpatient Hospital Stay (HOSPITAL_COMMUNITY): Payer: 59

## 2023-01-21 DIAGNOSIS — R4182 Altered mental status, unspecified: Secondary | ICD-10-CM | POA: Diagnosis not present

## 2023-01-21 DIAGNOSIS — R579 Shock, unspecified: Secondary | ICD-10-CM | POA: Diagnosis not present

## 2023-01-21 LAB — BASIC METABOLIC PANEL
Anion gap: 7 (ref 5–15)
BUN: 26 mg/dL — ABNORMAL HIGH (ref 8–23)
CO2: 21 mmol/L — ABNORMAL LOW (ref 22–32)
Calcium: 8.3 mg/dL — ABNORMAL LOW (ref 8.9–10.3)
Chloride: 107 mmol/L (ref 98–111)
Creatinine, Ser: 1.92 mg/dL — ABNORMAL HIGH (ref 0.44–1.00)
GFR, Estimated: 28 mL/min — ABNORMAL LOW (ref >60)
Glucose, Bld: 124 mg/dL — ABNORMAL HIGH (ref 70–99)
Potassium: 4.4 mmol/L (ref 3.5–5.1)
Sodium: 135 mmol/L (ref 135–145)

## 2023-01-21 LAB — CBC WITH DIFFERENTIAL/PLATELET
Abs Immature Granulocytes: 0.07 10*3/uL (ref 0.00–0.07)
Basophils Absolute: 0 10*3/uL (ref 0.0–0.1)
Basophils Relative: 0 %
Eosinophils Absolute: 0 10*3/uL (ref 0.0–0.5)
Eosinophils Relative: 0 %
HCT: 30.2 % — ABNORMAL LOW (ref 36.0–46.0)
Hemoglobin: 9.8 g/dL — ABNORMAL LOW (ref 12.0–15.0)
Immature Granulocytes: 1 %
Lymphocytes Relative: 6 %
Lymphs Abs: 0.7 10*3/uL (ref 0.7–4.0)
MCH: 23.2 pg — ABNORMAL LOW (ref 26.0–34.0)
MCHC: 32.5 g/dL (ref 30.0–36.0)
MCV: 71.6 fL — ABNORMAL LOW (ref 80.0–100.0)
Monocytes Absolute: 0.3 10*3/uL (ref 0.1–1.0)
Monocytes Relative: 3 %
Neutro Abs: 10.4 10*3/uL — ABNORMAL HIGH (ref 1.7–7.7)
Neutrophils Relative %: 90 %
Platelets: 154 10*3/uL (ref 150–400)
RBC: 4.22 MIL/uL (ref 3.87–5.11)
RDW: 15.1 % (ref 11.5–15.5)
WBC: 11.5 10*3/uL — ABNORMAL HIGH (ref 4.0–10.5)
nRBC: 0.2 % (ref 0.0–0.2)

## 2023-01-21 LAB — GLUCOSE, CAPILLARY
Glucose-Capillary: 130 mg/dL — ABNORMAL HIGH (ref 70–99)
Glucose-Capillary: 126 mg/dL — ABNORMAL HIGH (ref 70–99)
Glucose-Capillary: 116 mg/dL — ABNORMAL HIGH (ref 70–99)
Glucose-Capillary: 77 mg/dL (ref 70–99)
Glucose-Capillary: 124 mg/dL — ABNORMAL HIGH (ref 70–99)

## 2023-01-21 LAB — CALCIUM, IONIZED: Calcium, Ionized, Serum: 4.4 mg/dL — ABNORMAL LOW (ref 4.5–5.6)

## 2023-01-21 LAB — PHOSPHORUS: Phosphorus: 3 mg/dL (ref 2.5–4.6)

## 2023-01-21 LAB — CORTISOL: Cortisol, Plasma: 51.4 ug/dL

## 2023-01-21 LAB — MAGNESIUM: Magnesium: 1.5 mg/dL — ABNORMAL LOW (ref 1.7–2.4)

## 2023-01-21 MED ORDER — MAGNESIUM SULFATE 2 GM/50ML IV SOLN
2.0000 g | Freq: Once | INTRAVENOUS | Status: AC
  Administered 2023-01-21: 2 g via INTRAVENOUS
  Filled 2023-01-21: qty 50

## 2023-01-21 NOTE — Procedures (Signed)
Patient Name: [name]  MRN: [id number]  Epilepsy Attending: Lora Havens  Referring Physician/Provider: Barb Merino, MD  Date: 01/21/2023 Duration: 24.38 mins  Patient history: 67yo F with ams. EEG to evaluate for seizure.  Level of alertness: Awake, asleep  AEDs during EEG study: None  Technical aspects: This EEG study was done with scalp electrodes positioned according to the 10-20 International system of electrode placement. Electrical activity was reviewed with band pass filter of 1-'70Hz'$ , sensitivity of 7 uV/mm, display speed of 57m/sec with a '60Hz'$  notched filter applied as appropriate. EEG data were recorded continuously and digitally stored.  Video monitoring was available and reviewed as appropriate.  Description: The posterior dominant rhythm consists of 7 Hz activity of moderate voltage (25-35 uV) seen predominantly in posterior head regions, symmetric and reactive to eye opening and eye closing. Sleep was characterized by vertex waves, maximal fronto-central region. EEG showed continuous generalized 5 to 7 Hz theta slowing admixed with intermittent 2-'3hz'$  delta slowing. Hyperventilation and photic stimulation were not performed.     ABNORMALITY - Continuous slow, generalized  IMPRESSION: This study is suggestive of moderate diffuse encephalopathy, nonspecific etiology. No seizures or epileptiform discharges were seen throughout the recording.  [name] OBarbra Sarks

## 2023-01-21 NOTE — Progress Notes (Signed)
PROGRESS NOTE    [name]  WFU:[id number] DOB: [birth year] DOA: 01/19/2023 PCP: Silverio Decamp, MD    Brief Narrative:  67 year old with history of essential hypertension, alpha thalassemia, sclerotic bone lesion of unclear etiology currently not on any treatment at home brought to the emergency room with unresponsiveness from home.  Reportedly patient was having some weakness and fatigue for the last few days but generally doing okay.  Son saw her at 8 PM the night before.  He went back home after 4 PM next day, did not find any response at home.  He had to knock down the door to get entry and found her unresponsive on the floor in the shower.  EMS was called.  She was found cold, low blood pressures and blood sugars of 30.  Brought to the emergency room resuscitated with IV fluids and dextrose.  Also given dose of steroids.  Clinically stabilizing.  Patient was admitted to the hospital June 2023 with similar complaints, hypoglycemia and dehydration.  She had extensive workup later on with oncology clinic and no metastatic disease was found.  Biopsies were inconclusive.  Followed by Dr. Marin Olp.    Assessment & Plan:   Hypoglycemia: Primary cause unknown.  Likely related to poor oral intake and laying on the floor for more than 24 hours. -Patient does not take any medications. -She had normal cortisol and TSH on last admission.  Cortisol level today adequate however she received high-dose of steroids yesterday morning. -Sulfonylurea panel is pending, she does not take any medicine at home. -Steroids discontinued.  Will check insulin levels and C-peptide blood sugar was less than 70. -Nuclear medicine bone scan 8/23 without any evidence of metastatic disease. -PET scan 06/2022 without any evidence of hypermetabolic lesion on the chest or abdomen.  Unlikely insulinoma.  Blood sugar stable last 24 hours on regular diet without any support of dextrose.  Continue to monitor blood  sugars today to ensure stabilization before discharge.  She did not show any evidence of cortisol deficiency on serological test as a.m. cortisol was adequate.  Will also order serum cortisol for tomorrow morning after 48 hours of oral steroids use.  Acute metabolic encephalopathy secondary hypoglycemia.  Improving.  CT head and MRI of the brain without any acute findings.  Mental status is improving.  Work with PT OT. Unsure what triggered her initial unresponsiveness.  Hypoglycemia could be primary cause or hypoglycemia could be secondary to laying unresponsive for 24 hours.  MRI was essentially negative for acute intracranial abnormalities.  Will check EEG.  With only 1 episode, unlikely seizure disorder.  Currently no indication for antiseizure medications. I have discussed in detail with patient and her son about monitoring for any recurrent symptoms.  Hypothermia and hypovolemia: Probably environmental.  Temperature adequately improved.  Blood pressures improved.  CKD stage IIIa: Probably has underlying hypertensive kidney disease.  Remains overall stable.  Continue maintenance IV fluids.  Recheck levels tomorrow morning.  Hypomagnesemia: Will replace.   DVT prophylaxis: enoxaparin (LOVENOX) injection 40 mg Start: 01/20/23 1400   Code Status: Full code Family Communication: Patient's son on the phone Disposition Plan: Status is: Inpatient.  Significant electrolyte abnormalities.   Consultants:  None.  Procedures:  None  Antimicrobials:  Discontinued   Subjective:  Patient seen and examined.  More alert awake and interactive.  Appetite is fair.  Blood sugars are stable since last 24 hours without artificial support.  Patient now tells me that she had not felt well for  the last few days and had called off sick at work.  This is her second admission with low blood sugars.  Last year she had not eaten well for last 4 days when this happened.  Objective: Vitals:   01/20/23 2022  01/21/23 0000 01/21/23 0330 01/21/23 0814  BP: 105/89 105/65 102/61 119/74  Pulse: 67 (!) 59 (!) 54 71  Resp: '20 11 13 16  '$ Temp: 98.1 F (36.7 C) 97.8 F (36.6 C) 97.6 F (36.4 C) 98.2 F (36.8 C)  TempSrc: Oral Oral Oral Oral  SpO2: 100% 100% 98% 100%    Intake/Output Summary (Last 24 hours) at 01/21/2023 1048 Last data filed at 01/21/2023 0403 Gross per 24 hour  Intake 1940.57 ml  Output 0 ml  Net 1940.57 ml    There were no vitals filed for this visit.  Examination:  General exam: Appears calm and comfortable.  Interactive today.  Oriented. Respiratory system: Clear to auscultation. Respiratory effort normal. Cardiovascular system: S1 & S2 heard, RRR. No JVD, murmurs, rubs, gallops or clicks. No pedal edema. Gastrointestinal system: Abdomen is nondistended, soft and nontender. No organomegaly or masses felt. Normal bowel sounds heard.    Data Reviewed: I have personally reviewed following labs and imaging studies  CBC: Recent Labs  Lab 01/19/23 1751 01/19/23 1833 01/19/23 2050 01/20/23 0437 01/21/23 0133  WBC 4.3  --   --  9.1 11.5*  NEUTROABS 3.5  --   --   --  10.4*  HGB 10.4* 11.2*  --  10.9* 9.8*  HCT 34.8* 33.0*  --  34.4* 30.2*  MCV 76.8*  --   --  72.9* 71.6*  PLT 123*  --  146* 174 673    Basic Metabolic Panel: Recent Labs  Lab 01/19/23 1833 01/19/23 2030 01/20/23 0437 01/21/23 0133  NA 133* 132* 132* 135  K 3.5 3.7 3.3* 4.4  CL  --  98 95* 107  CO2  --  24 23 21*  GLUCOSE  --  186* 123* 124*  BUN  --  15 16 26*  CREATININE  --  1.02* 1.23* 1.92*  CALCIUM  --  8.7* 8.6* 8.3*  MG  --   --   --  1.5*  PHOS  --   --   --  3.0    GFR: CrCl cannot be calculated (Unknown ideal weight.). Liver Function Tests: Recent Labs  Lab 01/19/23 2030 01/20/23 0437  AST 67* 70*  ALT 18 22  ALKPHOS 69 68  BILITOT 0.8 1.0  PROT 6.4* 6.8  ALBUMIN 3.2* 3.5    No results for input(s): "LIPASE", "AMYLASE" in the last 168 hours. No results for  input(s): "AMMONIA" in the last 168 hours. Coagulation Profile: Recent Labs  Lab 01/19/23 1751 01/19/23 2050  INR 1.2 1.3*    Cardiac Enzymes: Recent Labs  Lab 01/19/23 2030 01/20/23 0437  CKTOTAL 1,838* 1,685*    BNP (last 3 results) No results for input(s): "PROBNP" in the last 8760 hours. HbA1C: No results for input(s): "HGBA1C" in the last 72 hours. CBG: Recent Labs  Lab 01/20/23 2031 01/20/23 2222 01/20/23 2356 01/21/23 0329 01/21/23 0604  GLUCAP 174* 156* 147* 130* 126*    Lipid Profile: No results for input(s): "CHOL", "HDL", "LDLCALC", "TRIG", "CHOLHDL", "LDLDIRECT" in the last 72 hours. Thyroid Function Tests: Recent Labs    01/19/23 1751 01/19/23 1752  TSH  --  2.053  FREET4 0.49*  --     Anemia Panel: No results for input(s): "VITAMINB12", "  FOLATE", "FERRITIN", "TIBC", "IRON", "RETICCTPCT" in the last 72 hours. Sepsis Labs: Recent Labs  Lab 01/19/23 1751 01/19/23 2030 01/19/23 2050  PROCALCITON  --  <0.10  --   LATICACIDVEN 3.4*  --  3.3*     Recent Results (from the past 240 hour(s))  Resp panel by RT-PCR (RSV, Flu A&B, Covid) Anterior Nasal Swab     Status: None   Collection Time: 01/19/23  5:52 PM   Specimen: Anterior Nasal Swab  Result Value Ref Range Status   SARS Coronavirus 2 by RT PCR NEGATIVE NEGATIVE Final   Influenza A by PCR NEGATIVE NEGATIVE Final   Influenza B by PCR NEGATIVE NEGATIVE Final    Comment: (NOTE) The Xpert Xpress SARS-CoV-2/FLU/RSV plus assay is intended as an aid in the diagnosis of influenza from Nasopharyngeal swab specimens and should not be used as a sole basis for treatment. Nasal washings and aspirates are unacceptable for Xpert Xpress SARS-CoV-2/FLU/RSV testing.  Fact Sheet for Patients: EntrepreneurPulse.com.au  Fact Sheet for Healthcare Providers: IncredibleEmployment.be  This test is not yet approved or cleared by the [place] FDA and has been  authorized for detection and/or diagnosis of SARS-CoV-2 by FDA under an Emergency Use Authorization (EUA). This EUA will remain in effect (meaning this test can be used) for the duration of the COVID-19 declaration under Section 564(b)(1) of the Act, 21 U.S.C. section 360bbb-3(b)(1), unless the authorization is terminated or revoked.     Resp Syncytial Virus by PCR NEGATIVE NEGATIVE Final    Comment: (NOTE) Fact Sheet for Patients: EntrepreneurPulse.com.au  Fact Sheet for Healthcare Providers: IncredibleEmployment.be  This test is not yet approved or cleared by the [place] FDA and has been authorized for detection and/or diagnosis of SARS-CoV-2 by FDA under an Emergency Use Authorization (EUA). This EUA will remain in effect (meaning this test can be used) for the duration of the COVID-19 declaration under Section 564(b)(1) of the Act, 21 U.S.C. section 360bbb-3(b)(1), unless the authorization is terminated or revoked.  Performed at [facility], [place] [address] [place], [place] [address]   Blood Culture (routine x 2)     Status: None (Preliminary result)   Collection Time: 01/19/23  6:24 PM   Specimen: BLOOD RIGHT HAND  Result Value Ref Range Status   Specimen Description BLOOD RIGHT HAND  Final   Special Requests   Final    BOTTLES DRAWN AEROBIC AND ANAEROBIC Blood Culture adequate volume   Culture   Final    NO GROWTH 2 DAYS Performed at [facility], [place] [address], [place], [place] [address]    Report Status PENDING  Incomplete  Blood Culture (routine x 2)     Status: None (Preliminary result)   Collection Time: 01/20/23  4:37 AM   Specimen: BLOOD LEFT HAND  Result Value Ref Range Status   Specimen Description BLOOD LEFT HAND  Final   Special Requests   Final    BOTTLES DRAWN AEROBIC AND ANAEROBIC Blood Culture adequate volume   Culture   Final    NO GROWTH 1 DAY Performed at [facility], [place] [address], [place], [place] [address]    Report Status PENDING  Incomplete         Radiology Studies: DG Shoulder Right  Result Date: 01/20/2023 CLINICAL DATA:  Bruising. EXAM: RIGHT SHOULDER - 2+ VIEW COMPARISON:  None Available. FINDINGS: There is no evidence of fracture or dislocation. Degenerative changes are present at the acromioclavicular joint. Soft tissues  are unremarkable. IMPRESSION: No acute fracture or dislocation. Electronically Signed   By: Brett Fairy M.D.   On: 01/20/2023 02:33   MR BRAIN W WO CONTRAST  Result Date: 01/20/2023 CLINICAL DATA:  Seizure EXAM: MRI HEAD WITHOUT AND WITH CONTRAST TECHNIQUE: Multiplanar, multiecho pulse sequences of the brain and surrounding structures were obtained without and with intravenous contrast. CONTRAST:  83m GADAVIST GADOBUTROL 1 MMOL/ML IV SOLN COMPARISON:  None Available. FINDINGS: Brain: No acute infarct, mass effect or extra-axial collection. Multiple chronic microhemorrhages in a predominantly central distribution. There is multifocal hyperintense T2-weighted signal within the white matter. Generalized volume loss. A partially empty sella is incidentally noted. Old right basal ganglia small vessel infarct. There is no abnormal contrast enhancement. Vascular: Normal flow voids. Skull and upper cervical spine: Normal marrow signal. Sinuses/Orbits: Negative. Other: None. IMPRESSION: 1. No acute intracranial abnormality. 2. Multiple chronic microhemorrhages in a predominantly central distribution, consistent with chronic hypertensive angiopathy. 3. Old right basal ganglia small vessel infarct. Electronically Signed   By: KUlyses JarredM.D.   On: 01/20/2023 00:39   CT Cervical Spine Wo Contrast  Result Date: 01/19/2023 CLINICAL DATA:  Patient was found on the floor unresponsive. Possible seizure. EXAM: CT CERVICAL SPINE WITHOUT CONTRAST TECHNIQUE: Multidetector CT imaging of the cervical spine was performed without intravenous contrast. Multiplanar CT  image reconstructions were also generated. RADIATION DOSE REDUCTION: This exam was performed according to the departmental dose-optimization program which includes automated exposure control, adjustment of the mA and/or kV according to patient size and/or use of iterative reconstruction technique. COMPARISON:  None Available. FINDINGS: Alignment: Straightening of usual cervical lordosis is likely positional but could indicate muscle spasm. No anterior subluxations. Normal alignment of the posterior elements. C1-2 articulation appears intact. Skull base and vertebrae: Skull base appears intact. No vertebral compression deformities. Focal areas of sclerosis demonstrated in the T3 vertebra, incompletely included within the field of view. Possibly sclerotic metastasis. Soft tissues and spinal canal: No prevertebral soft tissue swelling. No abnormal paraspinal soft tissue mass or infiltration. Disc levels: Degenerative changes with disc space narrowing and endplate osteophyte formation, most prominent at C5-6 and C6-7 levels. Upper chest: Patchy ground-glass infiltrates in the right lung apex may represent pneumonia. Aspiration would be a possibility in this clinical scenario. Other: None. IMPRESSION: 1. Nonspecific straightening of usual cervical lordosis. No acute displaced fractures identified. 2. Degenerative changes in the cervical spine. 3. Sclerotic foci demonstrated in the T3 vertebra, incompletely included, nonspecific but possibly metastatic disease. Clinical correlation is suggested. 4. Ground-glass infiltrates seen in the right lung apex likely due to pneumonia. Aspiration would be possible in this clinical setting. Electronically Signed   By: WLucienne CapersM.D.   On: 01/19/2023 19:33   CT Head Wo Contrast  Result Date: 01/19/2023 CLINICAL DATA:  Head trauma with possible seizure. Patient was found on the floor unresponsive. EXAM: CT HEAD WITHOUT CONTRAST TECHNIQUE: Contiguous axial images were obtained  from the base of the skull through the vertex without intravenous contrast. RADIATION DOSE REDUCTION: This exam was performed according to the departmental dose-optimization program which includes automated exposure control, adjustment of the mA and/or kV according to patient size and/or use of iterative reconstruction technique. COMPARISON:  05/23/2022 FINDINGS: Brain: No evidence of acute infarction, hemorrhage, hydrocephalus, extra-axial collection or mass lesion/mass effect. Diffuse cerebral atrophy. Ventricular dilatation likely due to central atrophy. Patchy low-attenuation changes in the deep white matter consistent small vessel ischemia. Empty sella appearance. Vascular: Intracranial arterial calcifications. Skull: Calvarium appears intact.  Sinuses/Orbits: Paranasal sinuses and mastoid air cells are clear. Other: None. IMPRESSION: No acute intracranial abnormalities. Chronic atrophy and small vessel ischemic changes. Electronically Signed   By: Lucienne Capers M.D.   On: 01/19/2023 19:29   DG Chest Port 1 View  Result Date: 01/19/2023 CLINICAL DATA:  Sepsis EXAM: PORTABLE CHEST 1 VIEW COMPARISON:  05/23/2022 FINDINGS: The heart size and mediastinal contours are within normal limits. Both lungs are clear. The visualized skeletal structures are unremarkable. IMPRESSION: No active disease. Electronically Signed   By: Randa Ngo M.D.   On: 01/19/2023 18:32        Scheduled Meds:  enoxaparin (LOVENOX) injection  40 mg Subcutaneous Q24H   potassium chloride  20 mEq Oral BID   Continuous Infusions:  sodium chloride 100 mL/hr at 01/21/23 0630     LOS: 1 day    Time spent: 35 minutes    Barb Merino, MD [facility] Pager [phone]

## 2023-01-21 NOTE — Progress Notes (Signed)
EEG complete - results pending 

## 2023-01-21 NOTE — Progress Notes (Signed)
Mobility Specialist Progress Note:   01/21/23 1329  Mobility  Activity Refused mobility   Pt eating lunch then getting a EEG done. Will follow-up as time allows for ambulation.   Gareth Eagle Eyvette Cordon Mobility Specialist Please contact via [employer] or  Rehab Office at [phone]

## 2023-01-22 DIAGNOSIS — T68XXXA Hypothermia, initial encounter: Secondary | ICD-10-CM

## 2023-01-22 DIAGNOSIS — R569 Unspecified convulsions: Secondary | ICD-10-CM | POA: Diagnosis not present

## 2023-01-22 DIAGNOSIS — C799 Secondary malignant neoplasm of unspecified site: Secondary | ICD-10-CM | POA: Diagnosis not present

## 2023-01-22 DIAGNOSIS — E162 Hypoglycemia, unspecified: Secondary | ICD-10-CM | POA: Diagnosis not present

## 2023-01-22 DIAGNOSIS — N1831 Chronic kidney disease, stage 3a: Secondary | ICD-10-CM | POA: Insufficient documentation

## 2023-01-22 DIAGNOSIS — G9341 Metabolic encephalopathy: Secondary | ICD-10-CM | POA: Insufficient documentation

## 2023-01-22 LAB — BASIC METABOLIC PANEL
Anion gap: 4 — ABNORMAL LOW (ref 5–15)
BUN: 23 mg/dL (ref 8–23)
CO2: 25 mmol/L (ref 22–32)
Calcium: 8.3 mg/dL — ABNORMAL LOW (ref 8.9–10.3)
Chloride: 109 mmol/L (ref 98–111)
Creatinine, Ser: 1.53 mg/dL — ABNORMAL HIGH (ref 0.44–1.00)
GFR, Estimated: 37 mL/min — ABNORMAL LOW (ref >60)
Glucose, Bld: 90 mg/dL (ref 70–99)
Potassium: 4 mmol/L (ref 3.5–5.1)
Sodium: 138 mmol/L (ref 135–145)

## 2023-01-22 LAB — GLUCOSE, CAPILLARY
Glucose-Capillary: 82 mg/dL (ref 70–99)
Glucose-Capillary: 211 mg/dL — ABNORMAL HIGH (ref 70–99)
Glucose-Capillary: 119 mg/dL — ABNORMAL HIGH (ref 70–99)
Glucose-Capillary: 103 mg/dL — ABNORMAL HIGH (ref 70–99)

## 2023-01-22 LAB — CORTISOL: Cortisol, Plasma: 7.4 ug/dL

## 2023-01-22 NOTE — Progress Notes (Signed)
Patient transferred to 2W11.

## 2023-01-22 NOTE — Plan of Care (Signed)
  Problem: Clinical Measurements: Goal: Ability to maintain clinical measurements within normal limits will improve Outcome: Progressing Goal: Will remain free from infection Outcome: Progressing Goal: Respiratory complications will improve Outcome: Progressing Goal: Cardiovascular complication will be avoided Outcome: Progressing   

## 2023-01-22 NOTE — Assessment & Plan Note (Signed)
Resolved

## 2023-01-22 NOTE — Progress Notes (Signed)
Notified son of transfer to 2W11.

## 2023-01-22 NOTE — Progress Notes (Signed)
  Progress Note   Patient: [name] WUJ:[id number] DOB: [birth year] DOA: 01/19/2023     2 DOS: the patient was seen and examined on 01/22/2023        Brief hospital course: 67 year old with history of essential hypertension, alpha thalassemia, sclerotic bone lesion of unclear etiology currently not on any treatment at home brought to the emergency room with unresponsiveness from home. Reportedly patient was having some weakness and fatigue for the last few days but generally doing okay. Son saw her at 8 PM the night before. He went back home after 4 PM next day, did not find any response at home. He had to knock down the door to get entry and found her unresponsive on the floor in the shower. EMS was called. She was found cold, low blood pressures and blood sugars of 30. Brought to the emergency room resuscitated with IV fluids and dextrose.      Assessment and Plan: * Hypoglycemia Cortisol normal.  Doubt adrenal insufficiency, seems to be more related to FTT and poor oral intake.  Hypothermia Hypotension and hypothermia with lactic acid elevation on presentation.  Suspect hypothermia due to hypoglycemia.   Adrenal crisis doubted.  CXR clear, no leukocytosis.  Doubt infection.  Feels well overnight with fluids.    Seizure ([place]) EEG and MRI brain normal.  May have had a provoked seizure, but no AED would be recommended.  Possible Metastatic disease ([place]) Sclerotic bone lesions (presumably one of which seen on CT spine today) seen during June admit last year. Saw oncology in follow up, fairly extensive work up and biopsy, but no definite confirmation of malignancy (See Dr. Antonieta Pert office notes). - Needs colonoscopy - Obtain CT chest abdomen and pelvis tomorrow  Hypertension BP normal - Hold amlodipine, lisinopril  Hypomagnesemia - Supplement magnesium  Stage 3a chronic kidney disease (CKD) (HCC) Cr 1.9 on admission, improving with fluids Cr still 1.5 from baseline 1.0  Acute  metabolic encephalopathy Due to hypoglycemia, rseolevd now          Subjective: No new complaints, feeling better.     Physical Exam: BP (!) 158/94 (BP Location: Left Arm)   Pulse (!) 59   Temp 98.2 F (36.8 C) (Oral)   Resp 16   Wt 50.4 kg   SpO2 100%   BMI 20.99 kg/m   Adult female, sitting up in chair, interactive and appropriate RRR, no murmurs, no peripheral edema Respiratory normal, lungs are without rales or wheezes   Data Reviewed: Creatinine down to 1.5, microcytic anemia without iron deficiency  Family Communication: Son at the bedside, niece at the bedside    Disposition: Status is: Inpatient Will need ongoing fluids, CT imaging tomorrow if creatinine is better, hopefully home tomorrow after imaging        Author: Edwin Dada, MD 01/22/2023 6:19 PM  For on call review [email or web address].

## 2023-01-22 NOTE — Progress Notes (Signed)
Occupational Therapy Treatment Patient Details Name: [name] MRN: [id number] DOB: 22-Oct-1956 Today's Date: 01/22/2023   History of present illness Pt is a 67 yo female admitted after being found unresponsive in her tub by her son.  Pt found to be hypoglycemic and hypotensive with encephalopathy possibly from hypoglycemia. PMH: CKD III, scelerotic bone lesion, alpha thalassemia.   OT comments  Pt seen for OT ADL retraining session today with focus on grooming/bathing and cognitive assessment using the Mini-Cog. Pt was given Mini Cog today and scored 1/5. A score of <3/5 indicates cognitive impairement and warrants further cognitive assessment. Pt reports that she does not have anyone that can stay with her 24/7 when d/c. She may need SNF rehab until cognition improves, unless family can provide necessary assist to maintain safety at home.   Recommendations for follow up therapy are one component of a multi-disciplinary discharge planning process, led by the attending physician.  Recommendations may be updated based on patient status, additional functional criteria and insurance authorization.    Follow Up Recommendations  Skilled nursing-short term rehab (<3 hours/day)     Assistance Recommended at Discharge Frequent or constant Supervision/Assistance  Patient can return home with the following  A little help with walking and/or transfers;A little help with bathing/dressing/bathroom;Assistance with cooking/housework;Direct supervision/assist for medications management;Assist for transportation;Direct supervision/assist for financial management;Help with stairs or ramp for entrance   Equipment Recommendations  None recommended by OT (TBD)    Recommendations for Other Services      Precautions / Restrictions Precautions Precautions: Fall       Mobility Bed Mobility Overal bed mobility: Needs Assistance Bed Mobility: Supine to Sit     Supine to sit: Supervision, HOB elevated    General bed mobility comments: Increased time to sit up in bed with HOB elevated    Transfers   Transfers:  (Pt declined OOB activities this morning as she had just woken up)     Balance Overall balance assessment: Needs assistance Sitting-balance support: Feet supported Sitting balance-Leahy Scale: Fair     ADL either performed or assessed with clinical judgement   ADL Overall ADL's : Needs assistance/impaired     Grooming: Wash/dry hands;Wash/dry face;Oral care;Sitting Grooming Details (indicate cue type and reason): Pt declined getting OOB this morning, but was noted to be supervision assist standing at sink and propping herself on sink on 01/20/23 Upper Body Bathing: Set up;Sitting   General ADL Comments: Pt seen for OT ADL retraining session today with focus on grooming/bathing and cognitive assessment using the Mini-Cog. Pt was given Mini Cog today and scored 1/5. A score of <3/5 indicates cognitive impairement and warrants further cognitive assessment. Pt reports that she does not have anyone that can stay with her 24/7 when d/c. She may need SNF rehab until cognition improves, unless family can provide necessary assist to maintain safety at home.    Extremity/Trunk Assessment Upper Extremity Assessment Upper Extremity Assessment: Generalized weakness;Overall WFL for tasks assessed   Lower Extremity Assessment Lower Extremity Assessment: Generalized weakness   Cervical / Trunk Assessment Cervical / Trunk Assessment: Normal    Vision Ability to See in Adequate Light: 0 Adequate Patient Visual Report: No change from baseline Vision Assessment?: No apparent visual deficits          Cognition Arousal/Alertness: Awake/alert Behavior During Therapy: Flat affect Overall Cognitive Status: Impaired/Different from baseline Area of Impairment: Awareness, Problem solving, Memory   Memory: Decreased short-term memory     Awareness: Emergent Problem Solving: Slow  processing, Requires verbal cues General Comments: Pt was given Mini Cog today and scored 1/5. A score of <3/5 warrants further cognitive assessment. Pt reports that she does not have anyone that can stay with her 24/7 when d/c.                   Pertinent Vitals/ Pain       Pain Assessment Pain Assessment: No/denies pain  Home Living Family/patient expects to be discharged to:: Private residence Living Arrangements: Children Available Help at Discharge: Family;Available PRN/intermittently Type of Home: House Home Access: Stairs to enter Entrance Stairs-Number of Steps: 3   Home Layout: Two level;Able to live on main level with bedroom/bathroom   Bathroom Shower/Tub: Tub/shower unit;Curtain   Bathroom Toilet: Standard   Home Equipment: None      Prior Functioning/Environment   Independent, ambulates without AD, drives, working at [employer] 2X/week        Progress Toward Goals  OT Goals(current goals can now be found in the care plan section)  Progress towards OT goals: Progressing toward goals  Acute Rehab OT Goals Patient Stated Goal: None stated OT Goal Formulation: With patient/family (Niece) Time For Goal Achievement: 02/03/23 Potential to Achieve Goals: Good  Plan Discharge plan needs to be updated       AM-PAC OT "6 Clicks" Daily Activity     Outcome Measure   Help from another person eating meals?: None Help from another person taking care of personal grooming?: A Little Help from another person toileting, which includes using toliet, bedpan, or urinal?: A Little Help from another person bathing (including washing, rinsing, drying)?: A Little Help from another person to put on and taking off regular upper body clothing?: None Help from another person to put on and taking off regular lower body clothing?: A Little 6 Click Score: 20    End of Session    OT Visit Diagnosis: Unsteadiness on feet (R26.81);Other symptoms and signs  involving cognitive function   Activity Tolerance Patient tolerated treatment well   Patient Left in bed;with call bell/phone within reach   Nurse Communication Other (comment) (Pt requesting Pure Elza Rafter be checked)        Time: 0160-1093 OT Time Calculation (min): 16 min  Charges: OT General Charges $OT Visit: 1 Visit OT Treatments $Therapeutic Activity: 8-22 mins   Almyra Deforest, OTR/L 01/22/2023, 8:49 AM

## 2023-01-22 NOTE — Progress Notes (Signed)
Physical Therapy Treatment Patient Details Name: [name] MRN: [id number] DOB: 03/25/1956 Today's Date: 01/22/2023   History of Present Illness Pt is a 67 yo female admitted 01/19/23 after being found unresponsive in her tub by her son.  Pt found to be hypoglycemic and hypotensive with encephalopathy possibly from hypoglycemia. PMH: CKD III, scelerotic bone lesion, alpha thalassemia.    PT Comments    Focused session on trying to improve pt's stability with ambulation as she displays mild instability, resulting in intermittent staggering and need for up to minA to recover her LOB. Pt challenged to change head positions and speeds and to take sideways braided steps and tandem steps forward to challenge her balance. Pt also practiced navigating stairs, needing up to minA to prevent LOB when descending. Pt's niece reports she and the pt's son and mother can provide 24/7 care/supervision at d/c if needed, thus current recommendations remain appropriate. Will continue to follow acutely.     Recommendations for follow up therapy are one component of a multi-disciplinary discharge planning process, led by the attending physician.  Recommendations may be updated based on patient status, additional functional criteria and insurance authorization.  Follow Up Recommendations  Home health PT     Assistance Recommended at Discharge Frequent or constant Supervision/Assistance  Patient can return home with the following A little help with walking and/or transfers;A little help with bathing/dressing/bathroom;Direct supervision/assist for medications management;Direct supervision/assist for financial management;Assist for transportation;Help with stairs or ramp for entrance   Equipment Recommendations  Rolling walker (2 wheels)    Recommendations for Other Services       Precautions / Restrictions Precautions Precautions: Fall Restrictions Weight Bearing Restrictions: No     Mobility  Bed  Mobility Overal bed mobility: Needs Assistance Bed Mobility: Supine to Sit     Supine to sit: Supervision, HOB elevated     General bed mobility comments: Supervision for safety, extra time to complete, HOB elevated    Transfers Overall transfer level: Needs assistance Equipment used: None Transfers: Sit to/from Stand Sit to Stand: Min guard           General transfer comment: close guard for safety only    Ambulation/Gait Ambulation/Gait assistance: Min guard, Min assist Gait Distance (Feet): 390 Feet Assistive device: None Gait Pattern/deviations: Step-through pattern, Decreased stride length, Narrow base of support, Staggering right, Staggering left Gait velocity: decr Gait velocity interpretation: <1.8 ft/sec, indicate of risk for recurrent falls   General Gait Details: Pt with mild instability, demonstrating a slight stagger laterally intermittently. Noted narrow BOS. Min guard assist majority of time for safety, but intermittently needing minA to recover her balance. Pt staggers more and slows her gait speed when cued to turn her head L <>R and nod her head up and down.   Stairs Stairs: Yes Stairs assistance: Min guard, Min assist Stair Management: No rails, Alternating pattern, Step to pattern, Forwards Number of Stairs: 4 General stair comments: Ascends with reciprocal pattern, min guard for safety. Descends slowly with step-to pattern and minA for stability   Wheelchair Mobility    Modified Rankin (Stroke Patients Only)       Balance Overall balance assessment: Needs assistance Sitting-balance support: Feet supported Sitting balance-Leahy Scale: Fair Sitting balance - Comments: Supervision sitting statically EOB   Standing balance support: No upper extremity supported, During functional activity Standing balance-Leahy Scale: Fair Standing balance comment: Able to ambulate without UE support, but displays moments of LOB needing up to minA to recover  High level balance activites: Side stepping, Braiding, Head turns, Sudden stops, Other (comment) High Level Balance Comments: Pt cued to complete sideways braided steps x5 each direction, x10 tandem forward steps, and spontaneous head turns/nods and sudden gait speed changes/stops. Up to minA to recover with LOB            Cognition Arousal/Alertness: Awake/alert Behavior During Therapy: Flat affect Overall Cognitive Status: Impaired/Different from baseline Area of Impairment: Awareness, Problem solving, Memory                     Memory: Decreased short-term memory     Awareness: Emergent Problem Solving: Slow processing, Requires verbal cues General Comments: Noted slowed processing at times. Niece confirmed someone could be with pt 24/7 if needed, but believes pt is at her baseline cognitively.        Exercises Other Exercises Other Exercises: Pt cued to complete sideways braided steps x5 each direction, x10 tandem forward steps, and spontaneous head turns/nods and sudden gait speed changes/stops. Up to minA to recover with LOB    General Comments General comments (skin integrity, edema, etc.): educated niece on noted cog issues with OT session this morning and recommending 24/7 supervision/assistance for pt safety upon d/c home. She verbalized understanding and reported she (the niece) or the pt's son or mother can all assist to provide 24/7 care as needed      Pertinent Vitals/Pain Pain Assessment Pain Assessment: Faces Faces Pain Scale: Hurts a little bit Pain Location: generally "stiff" Pain Descriptors / Indicators: Other (Comment) ("stiff") Pain Intervention(s): Limited activity within patient's tolerance, Monitored during session, Repositioned    Home Living Family/patient expects to be discharged to:: Private residence Living Arrangements: Children Available Help at Discharge: Family;Available PRN/intermittently Type of Home: House Home  Access: Stairs to enter   Entrance Stairs-Number of Steps: 3   Home Layout: Two level;Able to live on main level with bedroom/bathroom Home Equipment: None      Prior Function            PT Goals (current goals can now be found in the care plan section) Acute Rehab PT Goals Patient Stated Goal: To get better PT Goal Formulation: With patient/family Time For Goal Achievement: 02/03/23 Potential to Achieve Goals: Good Progress towards PT goals: Progressing toward goals    Frequency    Min 3X/week      PT Plan Current plan remains appropriate    Co-evaluation              AM-PAC PT "6 Clicks" Mobility   Outcome Measure  Help needed turning from your back to your side while in a flat bed without using bedrails?: A Little Help needed moving from lying on your back to sitting on the side of a flat bed without using bedrails?: A Little Help needed moving to and from a bed to a chair (including a wheelchair)?: A Little Help needed standing up from a chair using your arms (e.g., wheelchair or bedside chair)?: A Little Help needed to walk in hospital room?: A Little Help needed climbing 3-5 steps with a railing? : A Little 6 Click Score: 18    End of Session Equipment Utilized During Treatment: Gait belt Activity Tolerance: Patient tolerated treatment well Patient left: in chair;with call bell/phone within reach;with chair alarm set;with family/visitor present Nurse Communication: Mobility status PT Visit Diagnosis: Other abnormalities of gait and mobility (R26.89);Unsteadiness on feet (R26.81)     Time: 8032-1224 PT Time Calculation (min) (  ACUTE ONLY): 15 min  Charges:  $Gait Training: 8-22 mins                     Moishe Spice, PT, DPT Acute Rehabilitation Services  Office: [phone]    Orvan Falconer 01/22/2023, 10:39 AM

## 2023-01-22 NOTE — Assessment & Plan Note (Signed)
Cr 1.9 on admission, resolved with fluids

## 2023-01-22 NOTE — Hospital Course (Signed)
[name] is a 67 y.o. F with HTN, alpha thal, and sclerotic bone lesions of unclear etiology who presented with being found unresponsive.    Son had noted patient with increased fatigue for a few days, and then on the day of admission was found unresponsive.  EMS found her hypothermic, hypotensive and glucose 30.

## 2023-01-22 NOTE — Assessment & Plan Note (Signed)
Due to hypoglycemia, rseolevd now

## 2023-01-23 ENCOUNTER — Inpatient Hospital Stay (HOSPITAL_COMMUNITY): Payer: 59

## 2023-01-23 DIAGNOSIS — C799 Secondary malignant neoplasm of unspecified site: Secondary | ICD-10-CM | POA: Diagnosis not present

## 2023-01-23 DIAGNOSIS — R569 Unspecified convulsions: Secondary | ICD-10-CM | POA: Diagnosis not present

## 2023-01-23 DIAGNOSIS — T68XXXA Hypothermia, initial encounter: Secondary | ICD-10-CM | POA: Diagnosis not present

## 2023-01-23 DIAGNOSIS — E162 Hypoglycemia, unspecified: Secondary | ICD-10-CM | POA: Diagnosis not present

## 2023-01-23 LAB — COMPREHENSIVE METABOLIC PANEL WITH GFR
ALT: 14 U/L (ref 0–44)
AST: 34 U/L (ref 15–41)
Albumin: 2.6 g/dL — ABNORMAL LOW (ref 3.5–5.0)
Alkaline Phosphatase: 61 U/L (ref 38–126)
Anion gap: 7 (ref 5–15)
BUN: 23 mg/dL (ref 8–23)
CO2: 28 mmol/L (ref 22–32)
Calcium: 8.2 mg/dL — ABNORMAL LOW (ref 8.9–10.3)
Chloride: 103 mmol/L (ref 98–111)
Creatinine, Ser: 0.99 mg/dL (ref 0.44–1.00)
GFR, Estimated: >60 mL/min
Glucose, Bld: 79 mg/dL (ref 70–99)
Potassium: 4.1 mmol/L (ref 3.5–5.1)
Sodium: 138 mmol/L (ref 135–145)
Total Bilirubin: 0.3 mg/dL (ref 0.3–1.2)
Total Protein: 5.5 g/dL — ABNORMAL LOW (ref 6.5–8.1)

## 2023-01-23 LAB — CBC
HCT: 29 % — ABNORMAL LOW (ref 36.0–46.0)
Hemoglobin: 9.4 g/dL — ABNORMAL LOW (ref 12.0–15.0)
MCH: 23.3 pg — ABNORMAL LOW (ref 26.0–34.0)
MCHC: 32.4 g/dL (ref 30.0–36.0)
MCV: 71.8 fL — ABNORMAL LOW (ref 80.0–100.0)
Platelets: 154 K/uL (ref 150–400)
RBC: 4.04 MIL/uL (ref 3.87–5.11)
RDW: 15.9 % — ABNORMAL HIGH (ref 11.5–15.5)
WBC: 7.1 K/uL (ref 4.0–10.5)
nRBC: 0 % (ref 0.0–0.2)

## 2023-01-23 LAB — CK: Total CK: 381 U/L — ABNORMAL HIGH (ref 38–234)

## 2023-01-23 LAB — CORTISOL-AM, BLOOD: Cortisol - AM: 4.6 ug/dL — ABNORMAL LOW (ref 6.7–22.6)

## 2023-01-23 LAB — PROCALCITONIN: Procalcitonin: 0.1 ng/mL

## 2023-01-23 LAB — GLUCOSE, CAPILLARY
Glucose-Capillary: 70 mg/dL (ref 70–99)
Glucose-Capillary: 92 mg/dL (ref 70–99)
Glucose-Capillary: 98 mg/dL (ref 70–99)
Glucose-Capillary: 113 mg/dL — ABNORMAL HIGH (ref 70–99)

## 2023-01-23 MED ORDER — PREDNISONE 20 MG PO TABS
50.0000 mg | ORAL_TABLET | Freq: Four times a day (QID) | ORAL | Status: AC
  Administered 2023-01-23 – 2023-01-24 (×3): 50 mg via ORAL
  Filled 2023-01-23 (×3): qty 1

## 2023-01-23 MED ORDER — ADULT MULTIVITAMIN W/MINERALS CH
1.0000 | ORAL_TABLET | Freq: Every day | ORAL | Status: DC
  Administered 2023-01-23 – 2023-01-24 (×2): 1 via ORAL
  Filled 2023-01-23 (×2): qty 1

## 2023-01-23 MED ORDER — AMLODIPINE BESYLATE 5 MG PO TABS
5.0000 mg | ORAL_TABLET | Freq: Every day | ORAL | Status: DC
  Administered 2023-01-23 – 2023-01-24 (×2): 5 mg via ORAL
  Filled 2023-01-23 (×2): qty 1

## 2023-01-23 MED ORDER — COSYNTROPIN 0.25 MG IJ SOLR: 0.2500 mg | Freq: Once | INTRAMUSCULAR | Status: DC

## 2023-01-23 MED ORDER — DIPHENHYDRAMINE HCL 50 MG/ML IJ SOLN: 50.0000 mg | Freq: Once | INTRAMUSCULAR | Status: AC

## 2023-01-23 MED ORDER — DIPHENHYDRAMINE HCL 25 MG PO CAPS
50.0000 mg | ORAL_CAPSULE | Freq: Once | ORAL | Status: AC
  Administered 2023-01-24: 50 mg via ORAL
  Filled 2023-01-23: qty 2

## 2023-01-23 NOTE — Progress Notes (Signed)
Occupational Therapy Treatment Patient Details Name: [name] MRN: [id number] DOB: 1956/05/11 Today's Date: 01/23/2023   History of present illness Pt is a 67 yo female admitted 01/19/23 after being found unresponsive in her tub by her son.  Pt found to be hypoglycemic and hypotensive with encephalopathy possibly from hypoglycemia. PMH: CKD III, scelerotic bone lesion, alpha thalassemia.   OT comments  Pt continues to demonstrate impaired cognition. Needing assist to order breakfast and pt in urine saturated bed and had not notified nursing staff. Assisted OOB to chair, for pericare and to change wet gown with min assist. Pt completed seated grooming with set up. Notified NT that pt needs a new purewick and that bed was stripped of linens. Per chart, pt has 24 hour supervision, updated d/c to [place].    Recommendations for follow up therapy are one component of a multi-disciplinary discharge planning process, led by the attending physician.  Recommendations may be updated based on patient status, additional functional criteria and insurance authorization.    Follow Up Recommendations  Home health OT     Assistance Recommended at Discharge Frequent or constant Supervision/Assistance  Patient can return home with the following  A little help with walking and/or transfers;A little help with bathing/dressing/bathroom;Assistance with cooking/housework;Direct supervision/assist for medications management;Assist for transportation;Direct supervision/assist for financial management;Help with stairs or ramp for entrance   Equipment Recommendations  None recommended by OT    Recommendations for Other Services      Precautions / Restrictions Precautions Precautions: Fall       Mobility Bed Mobility Overal bed mobility: Modified Independent             General bed mobility comments: increased time    Transfers Overall transfer level: Needs assistance Equipment used: None Transfers:  Sit to/from Stand, Bed to chair/wheelchair/BSC Sit to Stand: Min guard     Step pivot transfers: Min guard           Balance Overall balance assessment: Needs assistance   Sitting balance-Leahy Scale: Good     Standing balance support: No upper extremity supported, During functional activity Standing balance-Leahy Scale: Fair                             ADL either performed or assessed with clinical judgement   ADL Overall ADL's : Needs assistance/impaired     Grooming: Wash/dry hands;Wash/dry face;Sitting;Set up           Upper Body Dressing : Minimal assistance;Sitting           Toileting- Clothing Manipulation and Hygiene: Min guard;Sit to/from stand       Functional mobility during ADLs: Minimal assistance      Extremity/Trunk Assessment              Vision       Perception     Praxis      Cognition Arousal/Alertness: Awake/alert Behavior During Therapy: Flat affect Overall Cognitive Status: Impaired/Different from baseline Area of Impairment: Problem solving                             Problem Solving: Slow processing, Decreased initiation          Exercises      Shoulder Instructions       General Comments      Pertinent Vitals/ Pain       Pain Assessment Pain Assessment: No/denies  pain  Home Living                                          Prior Functioning/Environment              Frequency  Min 2X/week        Progress Toward Goals  OT Goals(current goals can now be found in the care plan section)  Progress towards OT goals: Progressing toward goals  Acute Rehab OT Goals OT Goal Formulation: With patient/family Time For Goal Achievement: 02/03/23 Potential to Achieve Goals: Good  Plan Discharge plan needs to be updated    Co-evaluation                 AM-PAC OT "6 Clicks" Daily Activity     Outcome Measure   Help from another person eating meals?:  None Help from another person taking care of personal grooming?: A Little Help from another person toileting, which includes using toliet, bedpan, or urinal?: A Little Help from another person bathing (including washing, rinsing, drying)?: A Little Help from another person to put on and taking off regular upper body clothing?: A Little Help from another person to put on and taking off regular lower body clothing?: A Little 6 Click Score: 19    End of Session Equipment Utilized During Treatment: Gait belt  OT Visit Diagnosis: Unsteadiness on feet (R26.81);Other symptoms and signs involving cognitive function   Activity Tolerance Patient tolerated treatment well   Patient Left in chair;with call bell/phone within reach;with family/visitor present   Nurse Communication Other (comment) (NT made aware pt was in chair, needs purewick)        Time: 7408-1448 OT Time Calculation (min): 20 min  Charges: OT General Charges $OT Visit: 1 Visit OT Treatments $Self Care/Home Management : 8-22 mins  Cleta Alberts, OTR/L Acute Rehabilitation Services Office: [phone]   Malka So 01/23/2023, 9:34 AM

## 2023-01-23 NOTE — Progress Notes (Signed)
Mobility Specialist - Progress Note   01/23/23 1534  Mobility  Activity Ambulated with assistance in hallway  Level of Assistance Contact guard assist, steadying assist  Assistive Device None  Distance Ambulated (ft) 500 ft  Activity Response Tolerated well  Mobility Referral Yes  $Mobility charge 1 Mobility   Pt received in bed and agreeable to mobility. Pt requested to use BR before hallway ambulation. Pt was independent for peri care. No complaints throughout session. Pt was returned to bed with all needs set.   [name]  Mobility Specialist Please contact via Solicitor or Rehab office at [phone]

## 2023-01-23 NOTE — TOC Initial Note (Signed)
Transition of Care [facility]) - Initial/Assessment Note    Patient Details  Name: [name] MRN: [id number] Date of Birth: 1956-07-09  Transition of Care [facility]) CM/SW Contact:    Curlene Labrum, RN Phone Number: 01/23/2023, 3:28 PM  Clinical Narrative:                 CM attempted to meet with the patient at the bedside, but mobility tech was providing mobility in the unit.  I called and spoke with the patient's son, Faviola Klare by phone and he states that he declined home health services and would prefer referral to [facility] therapy clinic for rehabilitation services.  I placed referral to the [place] location close to the patient's home.  Referral to be co-signed by the attending physician.  The patient's son was given choice regarding DME company for RW and he did not have a preference.  I called [employer] to deliver a RW to the patient's hospital room and the son is aware.  The patient's family will be providing transportation to home once the patient is medically stable for discharge - CT pending at this time.  Expected Discharge Plan: OP Rehab Barriers to Discharge: Continued Medical Work up   Patient Goals and CMS Choice   CMS Medicare.gov Compare Post Acute Care list provided to:: Patient Represenative (must comment) (patient's son) Choice offered to / list presented to : Adult [place] ownership interest in [facility].provided to:: Adult Children    Expected Discharge Plan and Services   Discharge Planning Services: CM Consult Post Acute Care Choice: Durable Medical Equipment ([employer] at the home) Living arrangements for the past 2 months: [place]                 DME Arranged: [place] rolling DME Agency: [employer] Date DME Agency Contacted: 01/23/23 Time DME Agency Contacted: 52 Representative spoke with at DME Agency: [place] called for RW HH Arranged:  (Patient's son declined home health  services for PT/OT and he requests Outpatient follow up for therapies - Outpatient referral placed to [place] location)          Prior Living Arrangements/Services Living arrangements for the past 2 months: Single Family Home Lives with:: Adult Children (son and neice provide 24 hour supervision at the home) Patient language and need for interpreter reviewed:: Yes Do you feel safe going back to the place where you live?: Yes        Care giver support system in place?: Yes (comment) Current home services: DME ([employer]) Criminal Activity/Legal Involvement Pertinent to Current Situation/Hospitalization: No - Comment as needed  Activities of Daily Living Home Assistive Devices/Equipment: None ADL Screening (condition at time of admission) Patient's cognitive ability adequate to safely complete daily activities?: No Is the patient deaf or have difficulty hearing?: No Does the patient have difficulty seeing, even when wearing glasses/contacts?: No Does the patient have difficulty concentrating, remembering, or making decisions?: No Patient able to express need for assistance with ADLs?: Yes Does the patient have difficulty dressing or bathing?: No Independently performs ADLs?: Yes (appropriate for developmental age) Does the patient have difficulty walking or climbing stairs?: No Weakness of Legs: None Weakness of Arms/Hands: None  Permission Sought/Granted Permission sought to share information with : Case Manager, Family Supports Permission granted to share information with : Yes, Verbal Permission Granted     Permission granted to share info w AGENCY: Outpatient referral for PT/OT  Permission granted to share  info w Relationship: son, Emmalyn Hinson - [phone]     Emotional Assessment Appearance:: Appears stated age Attitude/Demeanor/Rapport: Gracious Affect (typically observed): Accepting   Alcohol / Substance Use: Not Applicable Psych Involvement: No  (comment)  Admission diagnosis:  Shock ([place]) [R57.9] Hypoglycemia [E16.2] Hypothermia, initial encounter [T68.XXXA] Altered mental status, unspecified altered mental status type [R41.82] Aspiration pneumonia of right lower lobe, unspecified aspiration pneumonia type [facility]) [J69.0] Patient Active Problem List   Diagnosis Date Noted   Acute metabolic encephalopathy 10/01/5101   Stage 3a chronic kidney disease (CKD) ([place]) 01/22/2023   Hypomagnesemia 01/22/2023   Hypothermia 01/19/2023   Seizure (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 [place] [place] [address] Phone: [phone] Fax: [phone]     Social Determinants of Health (SDOH) Social History: SDOH Screenings   Tobacco Use: Low Risk  (09/24/2022)   SDOH Interventions:     Readmission Risk Interventions    01/23/2023    3:28 PM  Readmission Risk Prevention Plan  Transportation Screening Complete  PCP or Specialist Appt within 5-7 Days Complete  Home Care Screening Complete  Medication Review (RN CM) Complete

## 2023-01-23 NOTE — Progress Notes (Signed)
Initial Nutrition Assessment  DOCUMENTATION CODES:   Not applicable  INTERVENTION:   - Magic Cup BID with meals, each supplement provides 290 kcal and 9 grams of protein  - MVI with minerals daily  - Continue to encourage adequate PO intake as pt is eating well at this time  NUTRITION DIAGNOSIS:   Increased nutrient needs related to acute illness as evidenced by estimated needs.  GOAL:   Patient will meet greater than or equal to 90% of their needs  MONITOR:   PO intake, Supplement acceptance, Labs, Weight trends  REASON FOR ASSESSMENT:   Consult Assessment of nutrition requirement/status  ASSESSMENT:   67 year old female who presented to the ED on 2/04 with AMS. PMH of HTN, alpha thalassemia, HLD, sclerotic bone lesions of unclear etiology. Pt admitted with shock, hypoglycemia.  Pt with sclerotic bone lesions. Pt has seen Oncology and had extensive work-up and biopsy without definite confirmation of malignancy. Noted plan for CT chest/abdomen/pelvis today.  Spoke with pt at bedside. Sister in room visiting at time of RD visit.  Pt with breakfast meal tray in front of her. Pt had consumed 100% of scrambled eggs, 75% of toast, 100% of grits, 100% of lemonade, 50% of fruit cup, and 100% of sausage patty. This equates to ~700 kcal and ~18 grams of protein. Pt reports good appetite and denies any recent changes in appetite.  Pt reports that she typically eats 2-3 meals daily. Pt works night shift. For breakfast, pt will eat grits and [place] sausage. Pt may or may not eat lunch. For dinner, pt will have takeout or go out to eat and consume salmon, sweet potato, and vegetables. Pt enjoys drinking chocolate milk.  Pt denies any recent changes in weight. Pt reports that her UBW is 126 lbs. She states that she used to weigh a little more than this but that was a while ago. Reviewed weight history in chart. Pt with a steady decline in weight over the last 8 months. Overall, pt has  lost 5.4 kg since 06/10/22. This is a 9.7% weight loss which is not clinically significant for timeframe but is concerning given ongoing nature of weight loss. Pt is at high risk for malnutrition.  Discussed importance of adequate kcal and protein intake with pt. Offered to order [employer] but pt reports that she dislikes these. She is willing to try Magic Cups with meals. RD will also order daily MVI with minerals.  Meal Completion: 100% x 2 documented meals on 01/20/23  Medications reviewed.  Labs reviewed: magnesium 1.5 on 01/21/23, hemoglobin 9.4  CBG's: 70-211 x 24 hours  NUTRITION - FOCUSED PHYSICAL EXAM:  Flowsheet Row Most Recent Value  Orbital Region Mild depletion  Upper Arm Region No depletion  Thoracic and Lumbar Region No depletion  Buccal Region Mild depletion  Temple Region Mild depletion  Clavicle Bone Region Moderate depletion  Clavicle and Acromion Bone Region Moderate depletion  Scapular Bone Region Mild depletion  Dorsal Hand Mild depletion  Patellar Region No depletion  Anterior Thigh Region Mild depletion  Posterior Calf Region No depletion  Edema (RD Assessment) None  Hair Reviewed  Eyes Reviewed  Mouth Reviewed  Skin Reviewed  Nails Reviewed       Diet Order:   Diet Order             Diet regular Room service appropriate? Yes; Fluid consistency: Thin  Diet effective now  EDUCATION NEEDS:   Education needs have been addressed  Skin:  Skin Assessment: Reviewed RN Assessment  Last BM:  01/18/23  Height:   Ht Readings from Last 1 Encounters:  09/24/22 '5\' 1"'$  (1.549 m)    Weight:   Wt Readings from Last 1 Encounters:  01/22/23 50.4 kg    BMI:  Body mass index is 20.99 kg/m.  Estimated Nutritional Needs:   Kcal:  1600-1800  Protein:  70-85 grams  Fluid:  1.6-1.8 L    Gustavus Bryant, MS, RD, LDN Inpatient Clinical Dietitian Please see AMiON for contact information.

## 2023-01-23 NOTE — Progress Notes (Signed)
  Progress Note   Patient: [name] LFY:[id number] DOB: April 12, 1956 DOA: 01/19/2023     3 DOS: the patient was seen and examined on 01/23/2023 at 8:29AM      Brief hospital course: Mrs. [name] is a 67 y.o. F with HTN, alpha thal, and sclerotic bone lesions of unclear etiology who presented with being found unresponsive.    Son had noted patient with increased fatigue for a few days, and then on the day of admission was found unresponsive.  EMS found her hypothermic, hypotensive and glucose 30.          Assessment and Plan: * Hypoglycemia Cortisol level normal yesterday, low today.  Will be on steroids for CT with contrast tomorrow, will defer cosyntropin to outpatient setting. - Recommend endocrinology referral after discharge  Hypothermia Hypotension and hypothermia with lactic acid elevation on presentation.  Suspect hypothermia due to hypoglycemia.   Adrenal crisis doubted.  CXR clear, no leukocytosis.  Doubt infection.  Feels well overnight with fluids.    Seizure ([name]) EEG and MRI brain normal.  May have had a provoked seizure, but no AED would be recommended.  Possible Metastatic disease ([name]) Sclerotic bone lesions (presumably one of which seen on CT spine today) seen during June admit last year. Saw oncology in follow up, fairly extensive work up and biopsy, but no definite confirmation of malignancy (See Dr. Antonieta [name] office notes). - Needs colonoscopy - Obtain CT chest abdomen and pelvis with steroid prep  Hypertension BP elevated - Hold lisinopril - Resume amlodipine  Hypomagnesemia Resolved  Stage 3a chronic kidney disease (CKD) ([facility]) Cr 1.9 on admission, resolved with fluids  Acute metabolic encephalopathy Due to hypoglycemia, now resolved          Subjective: No complaints.     Physical Exam: BP (!) 149/94 (BP Location: Right Arm)   Pulse (!) 56   Temp 98.1 F (36.7 C)   Resp 18   Wt 50.4 kg   SpO2 100%   BMI 20.99 kg/m   Adult  female, lying in bed, no acute distress RRR, no murmurs, no peripheral edema Respiratory rate normal, lungs clear without rales or wheezes  Data Reviewed: Cortisol level, CK down to 380 Creatinine down to 0.9  Family Communication: son by phone    Disposition: Status is: Inpatient         Author: Edwin Dada, MD 01/23/2023 1:25 PM  For on call review [email or web address].

## 2023-01-24 ENCOUNTER — Inpatient Hospital Stay (HOSPITAL_COMMUNITY): Payer: 59

## 2023-01-24 LAB — CULTURE, BLOOD (ROUTINE X 2)
Culture: NO GROWTH
Special Requests: ADEQUATE

## 2023-01-24 LAB — GLUCOSE, CAPILLARY
Glucose-Capillary: 118 mg/dL — ABNORMAL HIGH (ref 70–99)
Glucose-Capillary: 259 mg/dL — ABNORMAL HIGH (ref 70–99)

## 2023-01-24 MED ORDER — AMLODIPINE BESYLATE 5 MG PO TABS: 5.0000 mg | ORAL_TABLET | Freq: Every day | ORAL | 3 refills | Status: AC

## 2023-01-24 MED ORDER — IOHEXOL 350 MG/ML SOLN
75.0000 mL | Freq: Once | INTRAVENOUS | Status: AC | PRN
  Administered 2023-01-24: 75 mL via INTRAVENOUS

## 2023-01-24 NOTE — Discharge Summary (Signed)
Physician Discharge Summary   Patient: [name] MRN: [id number] DOB: [birth year]  Admit date:     01/19/2023  Discharge date: 01/24/23  Discharge Physician: Edwin Dada   PCP: Silverio Decamp, MD     Recommendations at discharge:  Follow up with Dr. Dianah Field in 1 week Dr. Dianah Field: Please see below re: follow up adrenal testing Please follow up BP back on amlodipine Please follow weights  Follow up with Gastroenterology Dr. Benson Norway for evaluation of microcytic anemia, weight loss and sclerotic spine lesions (is repeat colonoscopy or is EGD warranted?) Follow up with Dr. Marin Olp as planned     Discharge Diagnoses: Principal Problem:   Acute metabolic encephalopathy due to hypoglycemia Active Problems:   Hypothermia   Possible Metastatic disease ([place])   Seizure, ruled out   Hypertension   Stage 3a chronic kidney disease (CKD) ([place])   Hypomagnesemia     Hospital Course: [name] is a 67 y.o. F with HTN, alpha thal, and sclerotic bone lesions of unclear etiology who presented with being found unresponsive.    Son had noted patient with increased fatigue for a few days, and then on the day of admission was found unresponsive.  EMS found her hypothermic, hypotensive and glucose 30.    In the ER, MRI brain normal, CXR clear.  WBC normal, procal normal.  Lactate and CK elevated.          * Hypoglycemia Hypothermia Patient given fluids and symptoms resolved.  Cortisol levels were obtained and repeated and levels were inconsistent (see below)    She may have some mild adrenal insufficiency, but with normal electrolytes and BP, I recommend outpatient work up.  There was high concern for metastatic disease, but CT chest/abdomen/pelvis was performed, and MRI brain and these were unrevealing (CT shows sclerotic lesions, unchanged since Aug 2023) and no new masses or adenopathy.  Ultimately, she was observed 48 hours in the hospital, had no  evidence, signs or symptoms of infection, had normal vital signs and felt well.    Given her weight loss, I suspect she is approaching malnutrition, and that this episode was solely from her 3rd shift schedule, superimposed on poor oral intake of calories/fluids in a 67 year old.    Low cortisol level Patient had one normal AM cortisol and 2 slightly low levels.  In absence of ongoing hypotension or electrolyte abnormalities, I recommend outpatient work up with Cosyntropin stim or salivary cortisol.  Or referral to endocrinology at Dr. Landry Corporal discretion.    Seizure doubted There was some concern that she may have had a hypoglycemic seizure.  However, here, EEG and MRI brain normal and I felt there was no convincing evidence she had a seizure.  Possible Metastatic disease ([place]) These were noted last year, and she has followed with Oncology.    Had a mammogram that was negative, and PET scan that was indeterminate.  Oncology are deferring biopsy for now, due to the size of the spine lesions.  CT here showed no change, no new masses, MRI brain ruled out brain mets.  Given her last colonoscopy was 6 years ago and she has weight loss of 5kg in the last 9 months, I recommend GI follow up to consider EGD/colonoscopy.     Hypertension Restarted amlodipine  Hypomagnesemia Resolved  Acute kidney injury on chronic kidney disease stage IIIa Cr 1.9 on admission, resolved with fluids to baseline  Acute metabolic encephalopathy Due to hypoglycemia, now resolved  The [facility] Controlled Substances Registry was reviewed for this patient prior to discharge.   Consultants: None Procedures performed: MRI brain, CT head, chest abdomen and pelvis  Disposition: Home   DISCHARGE MEDICATION: Allergies as of 01/24/2023       Reactions   Isovue [iopamidol] Hives   Pt broke out in several facial hives, one on her back.  She will need full premeds in the future.  J  Bohm No allergy demonstrated to  Orally ingested Iodinated agent. (Gastrographin CT Scan 05-12-16).   Iodine-131 Rash        Medication List     TAKE these medications    amLODipine 5 MG tablet Commonly known as: NORVASC Take 1 tablet (5 mg total) by mouth daily. What changed:  medication strength how much to take   ECHINACEA PO Take 1 capsule by mouth daily.   Elderberry 500 MG Caps Take 500 mg by mouth daily.   Flaxseed Oil 1200 MG Caps Take 1,200 mg by mouth daily.   multivitamin with minerals Tabs tablet Take 1 tablet by mouth daily.               Durable Medical Equipment  (From admission, onward)           Start     Ordered   01/23/23 1533  For home use only DME Walker rolling  Once       Question Answer Comment  Walker: With 5 Inch Wheels   Patient needs a walker to treat with the following condition Generalized weakness      01/23/23 1532            Follow-up Information     [facility] Follow up.   Specialty: Rehabilitation Why: Please call the Rehabilitation center to follow up regarding outpatient rehabilitation services. Contact information: [place] [address] [place] [id number] [place], [facility] Follow up.   Why: [employer] will be delivering a rolling walker to the patient's hospital room prior to discharge to home Contact information: [place] [phone]         Silverio Decamp, MD. Schedule an appointment as soon as possible for a visit in 1 week(s).   Specialties: Family Medicine, Sports Medicine, Radiology Contact information: [place] [address] [phone]         Carol Ada, MD. Call.   Specialty: Gastroenterology Contact information: [address] [place] [address] [phone]                 Discharge Instructions      Ambulatory referral to Physical Therapy   Complete by: As directed    Iontophoresis - 4 mg/ml of dexamethasone: No   T.E.N.S. Unit Evaluation and Dispense as Indicated: No   Discharge instructions   Complete by: As directed    **IMPORTANT DISCHARGE INSTRUCTIONS**   From Dr. Loleta Books: You were admitted for passing out  Here, we found that this was from low blood sugar which led to hypothermia (low body temperature) and low blood pressure.  We suspect this low blood sugar was from not eating and drinking enough.  Start a multivitamin and look for high calorie, high protein nutrition supplements (at the pharmacy or online like [facility])  Go see Dr. Darene Lamer in 1 week Ask him what testing your need for follow up  As further part of your  work up for the spots in your spine, I recommend you see Dr. Benson Norway to discuss a colonoscopy  I have restarted your blood pressure medicine amlodipine, but at 5 mg, because we saw here that you had some high blood pressure and I think a little of it is worth it Ask Dr. Darene Lamer if you need to continue it   Increase activity slowly   Complete by: As directed        Discharge Exam: Filed Weights   01/22/23 1355  Weight: 50.4 kg    General: Pt is alert, awake, not in acute distress Cardiovascular: RRR, nl S1-S2, no murmurs appreciated.   No LE edema.   Respiratory: Normal respiratory rate and rhythm.  CTAB without rales or wheezes. Abdominal: Abdomen soft and non-tender.  No distension or HSM.   Neuro/Psych: Strength symmetric in upper and lower extremities.  Judgment and insight appear normal.   Condition at discharge: good  The results of significant diagnostics from this hospitalization (including imaging, microbiology, ancillary and laboratory) are listed below for reference.   Imaging Studies: CT CHEST ABDOMEN PELVIS W CONTRAST  Result Date: 01/24/2023 CLINICAL DATA:  Inpatient. Known history of sclerotic bone lesions. Evaluate for metastatic disease.  Patient admitted with hypoglycemia. * Tracking Code: BO * EXAM: CT CHEST, ABDOMEN, AND PELVIS WITH CONTRAST TECHNIQUE: Multidetector CT imaging of the chest, abdomen and pelvis was performed following the standard protocol during bolus administration of intravenous contrast. RADIATION DOSE REDUCTION: This exam was performed according to the departmental dose-optimization program which includes automated exposure control, adjustment of the mA and/or kV according to patient size and/or use of iterative reconstruction technique. CONTRAST:  4m OMNIPAQUE IOHEXOL 350 MG/ML SOLN COMPARISON:  06/24/2022 PET-CT. 05/23/2022 chest CT and CT abdomen/pelvis. FINDINGS: CT CHEST FINDINGS Cardiovascular: Normal heart size. No significant pericardial effusion/thickening. Three-vessel coronary atherosclerosis. Atherosclerotic nonaneurysmal thoracic aorta. Normal caliber pulmonary arteries. No central pulmonary emboli. Mediastinum/Nodes: No significant thyroid nodules. Unremarkable esophagus. No pathologically enlarged axillary, mediastinal or hilar lymph nodes. Lungs/Pleura: No pneumothorax. No significant pleural effusions. Mild platelike dependent bilateral lower lobe atelectasis. No acute consolidative airspace disease, lung masses or significant pulmonary nodules. Musculoskeletal: Small sclerotic lesions scattered throughout the thoracic vertebral bodies and ribs, largest 1.1 cm in the T11 vertebral body (series 7/image 69), not appreciably changed from 05/23/2022 chest CT. No new focal osseous lesions in the chest. Minimal thoracic spondylosis. CT ABDOMEN PELVIS FINDINGS Hepatobiliary: Normal liver with no liver mass. Cholecystectomy. No biliary ductal dilatation. Pancreas: Normal, with no mass or duct dilation. Spleen: Stable atrophic spleen with bandlike scarring and capsular retraction at the periphery of the spleen, unchanged. No splenic masses. Adrenals/Urinary Tract: Normal adrenals. Normal kidneys with no hydronephrosis  and no renal mass. Normal bladder. Stomach/Bowel: Normal non-distended stomach. Normal caliber small bowel with no small bowel wall thickening. Appendix not discretely visualized. Normal large bowel with no diverticulosis, large bowel wall thickening or pericolonic fat stranding. Vascular/Lymphatic: Atherosclerotic nonaneurysmal abdominal aorta. Patent portal, splenic, hepatic and renal veins. No pathologically enlarged lymph nodes in the abdomen or pelvis. Reproductive: Stable retroverted uterus with suggestion of anterior uterine body 2.2 cm fibroid, stable size. No adnexal masses. Other: Trace free fluid in the pelvis. No focal fluid collection. No pneumoperitoneum. Musculoskeletal: Innumerable small sclerotic lesions scattered throughout the lumbar spine and less prominent levy in the bilateral pelvic girdle and proximal femora, not appreciably changed from 05/23/2022 CT abdomen, although new from 05/12/2016 CT abdomen as previously detailed. No appreciable new osseous lesions. IMPRESSION: 1.  Innumerable small sclerotic lesions scattered throughout the thoracolumbar spine and less prominently in the ribs and pelvic girdle, not appreciably changed from 05/23/2022 CT studies, although new from 05/12/2016 CT studies as previously detailed. No appreciable new interval osseous lesions. Metastatic disease remains a concern. 2. No lymphadenopathy or other findings of extraosseous metastatic disease in the chest, abdomen or pelvis. 3. Three-vessel coronary atherosclerosis. 4.  Aortic Atherosclerosis (ICD10-I70.0). Electronically Signed   By: Ilona Sorrel M.D.   On: 01/24/2023 11:19   EEG adult  Result Date: 01/21/2023 Lora Havens, MD     01/21/2023  4:13 PM Patient Name: Kathlee Kohlman MRN: [id number] Epilepsy Attending: Lora Havens Referring Physician/Provider: Barb Merino, MD Date: 01/21/2023 Duration: 24.38 mins Patient history: 67yo F with ams. EEG to evaluate for seizure. Level of alertness: Awake,  asleep AEDs during EEG study: None Technical aspects: This EEG study was done with scalp electrodes positioned according to the 10-20 International system of electrode placement. Electrical activity was reviewed with band pass filter of 1-70Hz$ , sensitivity of 7 uV/mm, display speed of 70m/sec with a 60Hz$  notched filter applied as appropriate. EEG data were recorded continuously and digitally stored.  Video monitoring was available and reviewed as appropriate. Description: The posterior dominant rhythm consists of 7 Hz activity of moderate voltage (25-35 uV) seen predominantly in posterior head regions, symmetric and reactive to eye opening and eye closing. Sleep was characterized by vertex waves, maximal fronto-central region. EEG showed continuous generalized 5 to 7 Hz theta slowing admixed with intermittent 2-3hz$  delta slowing. Hyperventilation and photic stimulation were not performed.   ABNORMALITY - Continuous slow, generalized IMPRESSION: This study is suggestive of moderate diffuse encephalopathy, nonspecific etiology. No seizures or epileptiform discharges were seen throughout the recording. PLora Havens  DG Shoulder Right  Result Date: 01/20/2023 CLINICAL DATA:  Bruising. EXAM: RIGHT SHOULDER - 2+ VIEW COMPARISON:  None Available. FINDINGS: There is no evidence of fracture or dislocation. Degenerative changes are present at the acromioclavicular joint. Soft tissues are unremarkable. IMPRESSION: No acute fracture or dislocation. Electronically Signed   By: LBrett FairyM.D.   On: 01/20/2023 02:33   MR BRAIN W WO CONTRAST  Result Date: 01/20/2023 CLINICAL DATA:  Seizure EXAM: MRI HEAD WITHOUT AND WITH CONTRAST TECHNIQUE: Multiplanar, multiecho pulse sequences of the brain and surrounding structures were obtained without and with intravenous contrast. CONTRAST:  519mGADAVIST GADOBUTROL 1 MMOL/ML IV SOLN COMPARISON:  None Available. FINDINGS: Brain: No acute infarct, mass effect or extra-axial  collection. Multiple chronic microhemorrhages in a predominantly central distribution. There is multifocal hyperintense T2-weighted signal within the white matter. Generalized volume loss. A partially empty sella is incidentally noted. Old right basal ganglia small vessel infarct. There is no abnormal contrast enhancement. Vascular: Normal flow voids. Skull and upper cervical spine: Normal marrow signal. Sinuses/Orbits: Negative. Other: None. IMPRESSION: 1. No acute intracranial abnormality. 2. Multiple chronic microhemorrhages in a predominantly central distribution, consistent with chronic hypertensive angiopathy. 3. Old right basal ganglia small vessel infarct. Electronically Signed   By: KeUlyses Jarred.D.   On: 01/20/2023 00:39   CT Cervical Spine Wo Contrast  Result Date: 01/19/2023 CLINICAL DATA:  Patient was found on the floor unresponsive. Possible seizure. EXAM: CT CERVICAL SPINE WITHOUT CONTRAST TECHNIQUE: Multidetector CT imaging of the cervical spine was performed without intravenous contrast. Multiplanar CT image reconstructions were also generated. RADIATION DOSE REDUCTION: This exam was performed according to the departmental dose-optimization program which includes automated exposure control, adjustment of the  mA and/or kV according to patient size and/or use of iterative reconstruction technique. COMPARISON:  None Available. FINDINGS: Alignment: Straightening of usual cervical lordosis is likely positional but could indicate muscle spasm. No anterior subluxations. Normal alignment of the posterior elements. C1-2 articulation appears intact. Skull base and vertebrae: Skull base appears intact. No vertebral compression deformities. Focal areas of sclerosis demonstrated in the T3 vertebra, incompletely included within the field of view. Possibly sclerotic metastasis. Soft tissues and spinal canal: No prevertebral soft tissue swelling. No abnormal paraspinal soft tissue mass or infiltration. Disc  levels: Degenerative changes with disc space narrowing and endplate osteophyte formation, most prominent at C5-6 and C6-7 levels. Upper chest: Patchy ground-glass infiltrates in the right lung apex may represent pneumonia. Aspiration would be a possibility in this clinical scenario. Other: None. IMPRESSION: 1. Nonspecific straightening of usual cervical lordosis. No acute displaced fractures identified. 2. Degenerative changes in the cervical spine. 3. Sclerotic foci demonstrated in the T3 vertebra, incompletely included, nonspecific but possibly metastatic disease. Clinical correlation is suggested. 4. Ground-glass infiltrates seen in the right lung apex likely due to pneumonia. Aspiration would be possible in this clinical setting. Electronically Signed   By: Lucienne Capers M.D.   On: 01/19/2023 19:33   CT Head Wo Contrast  Result Date: 01/19/2023 CLINICAL DATA:  Head trauma with possible seizure. Patient was found on the floor unresponsive. EXAM: CT HEAD WITHOUT CONTRAST TECHNIQUE: Contiguous axial images were obtained from the base of the skull through the vertex without intravenous contrast. RADIATION DOSE REDUCTION: This exam was performed according to the departmental dose-optimization program which includes automated exposure control, adjustment of the mA and/or kV according to patient size and/or use of iterative reconstruction technique. COMPARISON:  05/23/2022 FINDINGS: Brain: No evidence of acute infarction, hemorrhage, hydrocephalus, extra-axial collection or mass lesion/mass effect. Diffuse cerebral atrophy. Ventricular dilatation likely due to central atrophy. Patchy low-attenuation changes in the deep white matter consistent small vessel ischemia. Empty sella appearance. Vascular: Intracranial arterial calcifications. Skull: Calvarium appears intact. Sinuses/Orbits: Paranasal sinuses and mastoid air cells are clear. Other: None. IMPRESSION: No acute intracranial abnormalities. Chronic atrophy  and small vessel ischemic changes. Electronically Signed   By: Lucienne Capers M.D.   On: 01/19/2023 19:29   DG Chest Port 1 View  Result Date: 01/19/2023 CLINICAL DATA:  Sepsis EXAM: PORTABLE CHEST 1 VIEW COMPARISON:  05/23/2022 FINDINGS: The heart size and mediastinal contours are within normal limits. Both lungs are clear. The visualized skeletal structures are unremarkable. IMPRESSION: No active disease. Electronically Signed   By: Randa Ngo M.D.   On: 01/19/2023 18:32    Microbiology: Results for orders placed or performed during the hospital encounter of 01/19/23  Resp panel by RT-PCR (RSV, Flu A&B, Covid) Anterior Nasal Swab     Status: None   Collection Time: 01/19/23  5:52 PM   Specimen: Anterior Nasal Swab  Result Value Ref Range Status   SARS Coronavirus 2 by RT PCR NEGATIVE NEGATIVE Final   Influenza A by PCR NEGATIVE NEGATIVE Final   Influenza B by PCR NEGATIVE NEGATIVE Final    Comment: (NOTE) The Xpert Xpress SARS-CoV-2/FLU/RSV plus assay is intended as an aid in the diagnosis of influenza from Nasopharyngeal swab specimens and should not be used as a sole basis for treatment. Nasal washings and aspirates are unacceptable for Xpert Xpress SARS-CoV-2/FLU/RSV testing.  Fact Sheet for Patients: EntrepreneurPulse.com.au  Fact Sheet for Healthcare Providers: IncredibleEmployment.be  This test is not yet approved or cleared by the [place]  States FDA and has been authorized for detection and/or diagnosis of SARS-CoV-2 by FDA under an Emergency Use Authorization (EUA). This EUA will remain in effect (meaning this test can be used) for the duration of the COVID-19 declaration under Section 564(b)(1) of the Act, 21 U.S.C. section 360bbb-3(b)(1), unless the authorization is terminated or revoked.     Resp Syncytial Virus by PCR NEGATIVE NEGATIVE Final    Comment: (NOTE) Fact Sheet for  Patients: EntrepreneurPulse.com.au  Fact Sheet for Healthcare Providers: IncredibleEmployment.be  This test is not yet approved or cleared by the [place] FDA and has been authorized for detection and/or diagnosis of SARS-CoV-2 by FDA under an Emergency Use Authorization (EUA). This EUA will remain in effect (meaning this test can be used) for the duration of the COVID-19 declaration under Section 564(b)(1) of the Act, 21 U.S.C. section 360bbb-3(b)(1), unless the authorization is terminated or revoked.  Performed at [facility], [place] [address], [place], [place] [address]   Blood Culture (routine x 2)     Status: None   Collection Time: 01/19/23  6:24 PM   Specimen: BLOOD RIGHT HAND  Result Value Ref Range Status   Specimen Description BLOOD RIGHT HAND  Final   Special Requests   Final    BOTTLES DRAWN AEROBIC AND ANAEROBIC Blood Culture adequate volume   Culture   Final    NO GROWTH 5 DAYS Performed at [facility], [place] [address], [place], [place] [address]    Report Status 01/24/2023 FINAL  Final  Blood Culture (routine x 2)     Status: None (Preliminary result)   Collection Time: 01/20/23  4:37 AM   Specimen: BLOOD LEFT HAND  Result Value Ref Range Status   Specimen Description BLOOD LEFT HAND  Final   Special Requests   Final    BOTTLES DRAWN AEROBIC AND ANAEROBIC Blood Culture adequate volume   Culture   Final    NO GROWTH 4 DAYS Performed at [facility], [place] [address] [place], [place] [address]    Report Status PENDING  Incomplete    Labs: CBC: Recent Labs  Lab 01/19/23 1751 01/19/23 1833 01/19/23 2050 01/20/23 0437 01/21/23 0133 01/23/23 0343  WBC 4.3  --   --  9.1 11.5* 7.1  NEUTROABS 3.5  --   --   --  10.4*  --   HGB 10.4* 11.2*  --  10.9* 9.8* 9.4*  HCT 34.8* 33.0*  --  34.4* 30.2* 29.0*  MCV 76.8*  --   --  72.9* 71.6* 71.8*  PLT 123*  --  146* 174 154 [id number]   Basic Metabolic  Panel: Recent Labs  Lab 01/19/23 2030 01/20/23 0437 01/21/23 0133 01/22/23 0152 01/23/23 0343  NA 132* 132* 135 138 138  K 3.7 3.3* 4.4 4.0 4.1  CL 98 95* 107 109 103  CO2 24 23 21* 25 28  GLUCOSE 186* 123* 124* 90 79  BUN 15 16 26* 23 23  CREATININE 1.02* 1.23* 1.92* 1.53* 0.99  CALCIUM 8.7* 8.6* 8.3* 8.3* 8.2*  MG  --   --  1.5*  --   --   PHOS  --   --  3.0  --   --    Liver Function Tests: Recent Labs  Lab 01/19/23 2030 01/20/23 0437 01/23/23 0343  AST 67* 70* 34  ALT 18 22 14  $ ALKPHOS 69 68 61  BILITOT 0.8 1.0 0.3  PROT 6.4* 6.8 5.5*  ALBUMIN 3.2*  3.5 2.6*   CBG: Recent Labs  Lab 01/23/23 1131 01/23/23 1611 01/23/23 2123 01/24/23 0820 01/24/23 1206  GLUCAP 92 98 113* 118* 259*    Discharge time spent: approximately 35 minutes spent on discharge counseling, evaluation of patient on day of discharge, and coordination of discharge planning with nursing, social work, pharmacy and case management  Signed: Edwin Dada, MD [facility] 01/24/2023

## 2023-01-24 NOTE — Plan of Care (Signed)

## 2023-01-24 NOTE — Progress Notes (Signed)
Mobility Specialist - Progress Note   01/24/23 1346  Mobility  Activity Ambulated with assistance in hallway  Level of Assistance Standby assist, set-up cues, supervision of patient - no hands on  Assistive Device None  Distance Ambulated (ft) 500 ft  Activity Response Tolerated well  Mobility Referral Yes  $Mobility charge 1 Mobility   Pt was received in bed and agreeable to mobility. Pt experienced x2 LOB but was able to self correct. No other complaints throughout session. Pt was returned to bed with all needs met.   Franki Monte  Mobility Specialist Please contact via Solicitor or Rehab office at [phone]

## 2023-01-24 NOTE — Progress Notes (Signed)
Physical Therapy Treatment Patient Details Name: [name] MRN: [id number] DOB: July 13, 1956 Today's Date: 01/24/2023   History of Present Illness Pt is a 67 yo female admitted 01/19/23 after being found unresponsive in her tub by her son.  Pt found to be hypoglycemic and hypotensive with encephalopathy possibly from hypoglycemia. PMH: CKD III, scelerotic bone lesion, alpha thalassemia.    PT Comments    Patient progressing well towards PT goals. Session focused on higher level balance and functional mobility. Continues to demonstrate deficits in cognition relating to difficulty with dual tasking, safety awareness and problem solving. Scored 18/24 on DGI indicating pt is still at risk for falls. Noted to have some mild gait deviations esp with head turns, walking backwards and difficulty with changes in direction. Min A needed during descending stairs. Discharge recommendation updated to OPPT as family able to take her to appts. Continue to recommend 24.7 supervision at home esp for higher level executive functioning tasks, IADLs etc. Will follow.    Recommendations for follow up therapy are one component of a multi-disciplinary discharge planning process, led by the attending physician.  Recommendations may be updated based on patient status, additional functional criteria and insurance authorization.  Follow Up Recommendations  Outpatient PT     Assistance Recommended at Discharge Frequent or constant Supervision/Assistance  Patient can return home with the following A little help with walking and/or transfers;A little help with bathing/dressing/bathroom;Direct supervision/assist for medications management;Direct supervision/assist for financial management;Assist for transportation;Help with stairs or ramp for entrance   Equipment Recommendations  None recommended by PT    Recommendations for Other Services       Precautions / Restrictions Precautions Precautions:  Fall Restrictions Weight Bearing Restrictions: No     Mobility  Bed Mobility Overal bed mobility: Modified Independent Bed Mobility: Supine to Sit, Sit to Supine     Supine to sit: Modified independent (Device/Increase time) Sit to supine: Modified independent (Device/Increase time)   General bed mobility comments: No assist needed.    Transfers Overall transfer level: Needs assistance Equipment used: None Transfers: Sit to/from Stand Sit to Stand: Min guard, Supervision           General transfer comment: MIn guard for safety progressing to supervision. Stood from toilet x1, from EOB x1.    Ambulation/Gait Ambulation/Gait assistance: Min assist, Min guard Gait Distance (Feet): 400 Feet Assistive device: None Gait Pattern/deviations: Step-through pattern, Decreased stride length, Narrow base of support, Staggering right, Staggering left Gait velocity: decr     General Gait Details: Slow, mostly steady gait without balance challenges. Needs Min A at times with head turns, changes in direction, walking backwards etc. See balance section for dtails. + stop and talk test when cognition is challenged,   Stairs Stairs: Yes Stairs assistance: Min guard, Min assist Stair Management: Alternating pattern, One rail Right Number of Stairs: 4 General stair comments: Ascends with reciprocal pattern, min guard for safety. Descends slowly with step-to pattern and minA for stability   Wheelchair Mobility    Modified Rankin (Stroke Patients Only)       Balance Overall balance assessment: Needs assistance Sitting-balance support: Feet supported, No upper extremity supported Sitting balance-Leahy Scale: Good Sitting balance - Comments: Able to doff/donn new socks without difficulty. Attempting to donn clean sock over wet sock initially.   Standing balance support: During functional activity Standing balance-Leahy Scale: Fair Standing balance comment: Able to ambulate  without UE support, but displays moments of LOB needing up to minA to recover with  higher level balance, able to wash hands at sink without difficulty                 Standardized Balance Assessment Standardized Balance Assessment : Dynamic Gait Index   Dynamic Gait Index Level Surface: Normal Change in Gait Speed: Mild Impairment Gait with Horizontal Head Turns: Mild Impairment Gait with Vertical Head Turns: Mild Impairment Gait and Pivot Turn: Normal Step Over Obstacle: Mild Impairment Step Around Obstacles: Normal Steps: Moderate Impairment Total Score: 18      Cognition Arousal/Alertness: Awake/alert Behavior During Therapy: Flat affect Overall Cognitive Status: Impaired/Different from baseline Area of Impairment: Awareness, Problem solving, Safety/judgement                         Safety/Judgement: Decreased awareness of deficits, Decreased awareness of safety Awareness: Emergent Problem Solving: Slow processing General Comments: Forgot she had the purewick in and while in the bathroom, not removing it or taking off the soaking wet mesh panties with urine on the floor. Difficulty with dual tasking needing to stop and answer questions with increased time. Difficulty counting by 7s from 100 and naming animals that start with C. Poor awareness of safety/deficits.        Exercises      General Comments General comments (skin integrity, edema, etc.): Sister present during session.      Pertinent Vitals/Pain Pain Assessment Pain Assessment: No/denies pain    Home Living                          Prior Function            PT Goals (current goals can now be found in the care plan section) Progress towards PT goals: Progressing toward goals    Frequency    Min 3X/week      PT Plan Discharge plan needs to be updated    Co-evaluation              AM-PAC PT "6 Clicks" Mobility   Outcome Measure  Help needed turning from your  back to your side while in a flat bed without using bedrails?: None Help needed moving from lying on your back to sitting on the side of a flat bed without using bedrails?: None Help needed moving to and from a bed to a chair (including a wheelchair)?: A Little Help needed standing up from a chair using your arms (e.g., wheelchair or bedside chair)?: A Little Help needed to walk in hospital room?: A Little Help needed climbing 3-5 steps with a railing? : A Little 6 Click Score: 20    End of Session Equipment Utilized During Treatment: Gait belt Activity Tolerance: Patient tolerated treatment well Patient left: in bed;with call bell/phone within reach;with chair alarm set;with family/visitor present Nurse Communication: Mobility status;Other (comment) (purewick out) PT Visit Diagnosis: Other abnormalities of gait and mobility (R26.89);Unsteadiness on feet (R26.81)     Time: [id number] PT Time Calculation (min) (ACUTE ONLY): 24 min  Charges:  $Gait Training: 8-22 mins $Neuromuscular Re-education: 8-22 mins                     Marisa Severin, PT, DPT Acute Rehabilitation Services Secure chat preferred Office [place] 01/24/2023, 12:23 PM

## 2023-01-25 LAB — CULTURE, BLOOD (ROUTINE X 2)
Culture: NO GROWTH
Special Requests: ADEQUATE

## 2023-01-27 ENCOUNTER — Telehealth: Payer: Self-pay

## 2023-01-27 ENCOUNTER — Telehealth: Payer: Self-pay | Admitting: General Practice

## 2023-01-27 NOTE — Telephone Encounter (Signed)
Transition Care Management Follow-up Telephone Call Date of discharge and from where: 01/24/23 from [facility] How have you been since you were released from the hospital? Her son reports that she is doing better she has been discharged. She will started physical therapy. Follow up to see PCP has been scheduled. Any questions or concerns? No  Items Reviewed: Did the pt receive and understand the discharge instructions provided? Yes  Medications obtained and verified? Yes  Other? No  Any new allergies since your discharge? No  Dietary orders reviewed? Yes Do you have support at home? Yes   Home Care and Equipment/Supplies: Were home health services ordered? no  Functional Questionnaire: (I = Independent and D = Dependent) ADLs: I  Bathing/Dressing- I  Meal Prep- I  Eating- I  Maintaining continence- I  Transferring/Ambulation- I  Managing Meds- I  Follow up appointments reviewed:  PCP Hospital f/u appt confirmed? Yes  Scheduled to see Dr. Darene Lamer on 01/29/23. [facility] f/u appt confirmed? No  Her son said that he will schedule the appointment for GI. Are transportation arrangements needed? No  If their condition worsens, is the pt aware to call PCP or go to the Emergency Dept.? Yes Was the patient provided with contact information for the PCP's office or ED? Yes Was to pt encouraged to call back with questions or concerns? Yes

## 2023-01-28 ENCOUNTER — Other Ambulatory Visit: Payer: Self-pay

## 2023-01-28 ENCOUNTER — Ambulatory Visit: Payer: 59 | Attending: Family Medicine

## 2023-01-28 DIAGNOSIS — G9341 Metabolic encephalopathy: Secondary | ICD-10-CM | POA: Insufficient documentation

## 2023-01-28 DIAGNOSIS — R2681 Unsteadiness on feet: Secondary | ICD-10-CM | POA: Diagnosis present

## 2023-01-28 LAB — SULFONYLUREA HYPOGLYCEMICS PANEL, SERUM
Acetohexamide: NEGATIVE ug/mL (ref 20–60)
Chlorpropamide: NEGATIVE ug/mL (ref 75–250)
Glimepiride: NEGATIVE ng/mL (ref 80–250)
Glipizide: NEGATIVE ng/mL (ref 200–1000)
Glyburide: NEGATIVE ng/mL
Nateglinide: NEGATIVE ng/mL
Repaglinide: NEGATIVE ng/mL
Tolazamide: NEGATIVE ug/mL
Tolbutamide: NEGATIVE ug/mL (ref 40–100)

## 2023-01-28 NOTE — Therapy (Signed)
OUTPATIENT PHYSICAL THERAPY NEURO EVALUATION   Patient Name: [name] MRN: [id number] DOB:[birth year], 67 y.o., female Today's Date: 01/28/2023   PCP: Silverio Decamp, MD REFERRING PROVIDER: Edwin Dada, MD  END OF SESSION:  PT End of Session - 01/28/23 1605     Visit Number 1    Authorization Type [employer]    Authorization - Visit Number 0    Authorization - Number of Visits 60    Progress Note Due on Visit 10    PT Start Time [id number]    PT Stop Time 1700    PT Time Calculation (min) 45 min             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   Hyperlipidemia    Hypertension    Past Surgical History:  Procedure Laterality Date   CHOLECYSTECTOMY N/A 05/21/2016   Procedure: LAPAROSCOPIC CHOLECYSTECTOMY;  Surgeon: Erroll Luna, MD;  Location: [place];  Service: General;  Laterality: N/A;   COLONOSCOPY     GIVENS CAPSULE STUDY N/A 07/22/2016   Procedure: GIVENS CAPSULE STUDY;  Surgeon: Carol Ada, MD;  Location: [place];  Service: Endoscopy;  Laterality: N/A;   Patient Active Problem List   Diagnosis Date Noted   Acute metabolic encephalopathy [id number]   Stage 3a chronic kidney disease (CKD) ([place]) 01/22/2023   Hypomagnesemia 01/22/2023   Hypothermia 01/19/2023   Seizure ([place]) 01/19/2023   Hypoglycemia 05/24/2022   Dehydration 05/24/2022   Possible Metastatic disease ([place]) 05/23/2022   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   Alpha thalassemia ([place]) 06/26/2016   Nausea and vomiting 05/20/2016   S/P cholecystectomy    Hyponatremia 01/31/2016   Diarrhea    Hyperlipidemia with target LDL less than 100 10/18/2012   Breast mass 10/16/2012   Hypertension 10/08/2012   Annual physical exam 10/08/2012    ONSET DATE: 01/22/23  REFERRING DIAG: G93.41 (0000000) - Acute metabolic encephalopathy  THERAPY DIAG:   Unsteadiness on feet  Rationale for Evaluation and Treatment: Rehabilitation  SUBJECTIVE:                                                                                                                                                                                             SUBJECTIVE STATEMENT: Some feeling of off balance. No dizziness or lightheaded. Feeling much better since leaving hospital  Pt accompanied by: self  PERTINENT HISTORY: Son had noted patient with increased fatigue for a few days, and then on the day of admission was found unresponsive. EMS  found her hypothermic, hypotensive and glucose 30. 67 y.o. F with HTN, alpha thal, and sclerotic bone lesions of unclear etiology who presented with being found unresponsive  PAIN:  Are you having pain? No  PRECAUTIONS: None  WEIGHT BEARING RESTRICTIONS: No  FALLS: Has patient fallen in last 6 months? No  LIVING ENVIRONMENT: Lives with: lives with their son Lives in: House/apartment Stairs: Yes: Internal: yes steps; bilateral but cannot reach both and External: 2-3 steps; none Has following equipment at home: None  PLOF: Independent and works for [employer]. 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 Nonspecific straightening of usual cervical lordosis. No acute displaced fractures identified. 2. Degenerative changes in the cervical spine. 3. Sclerotic foci demonstrated in the T3 vertebra, incompletely included, nonspecific but possibly metastatic disease. Clinical correlation is suggested. 4. Ground-glass infiltrates seen in the right lung apex likely due to pneumonia. Aspiration would be possible in this clinical setting.  COGNITION: Overall cognitive status:  1 of 3 item recall able to get 1/3 with prompt   SENSATION: WFL  COORDINATION: WNL  EDEMA:    MUSCLE TONE:    MUSCLE LENGTH:  DTRs:    POSTURE: No Significant postural limitations  LOWER EXTREMITY ROM:     WNL  LOWER EXTREMITY MMT:    5/5 BLE  BED MOBILITY:  Indep  TRANSFERS: Assistive device utilized: None  Sit to stand: Complete Independence Stand to sit: Complete Independence Chair to chair: Complete Independence Floor: Complete Independence  RAMP:  Indep  CURB:  Indep  STAIRS: Level of Assistance: Complete Independence Stair Negotiation Technique: Alternating Pattern  with No Rails Number of Stairs: 15  Height of Stairs: 4-6  Comments:   GAIT: Gait pattern: WFL Distance walked:  Assistive device utilized: None Level of assistance: Complete Independence Comments: Gait speed 3.15 ft/sec  FUNCTIONAL TESTS:  5 times sit to stand: 12.34 sec Timed up and go (TUG): 11 sec Berg Balance Scale: 56/56 Dynamic Gait Index: 23/24  M-CTSIB  Condition 1: Firm Surface, EO 30 Sec, Normal Sway  Condition 2: Firm Surface, EC 30 Sec, Normal Sway  Condition 3: Foam Surface, EO 30 Sec, Normal Sway  Condition 4: Foam Surface, EC 30 Sec, Normal Sway    PATIENT SURVEYS:       PATIENT EDUCATION: Education details: assessment details/findings Person educated: Patient Education method: Explanation Education comprehension: verbalized understanding  HOME EXERCISE PROGRAM:   GOALS: Goals reviewed with patient? N/A   ASSESSMENT:  CLINICAL IMPRESSION: Patient is a 67 y.o. lady who was seen today for physical therapy evaluation and treatment for recent hospitalization and unsteadiness on feet.  Exam findings thankfully unrevealing and finds pt with 5/5 BLE strength, normal static and dynamic balance, low risk for falls per several fall risk assessments, and able to ambulate in her typical fashion and speed without report or demonstration of deficits or limitations. Pt also reports feeling much improved and nearly back to her typical level of health and function with her  only observation being occasional listing/deviating in gait when turning her head side to side which was also observed during Dynamic Gait Index.  However, given her PLOF and level of activity this will hopefully improve with time and as she returns to activity. No skilled PT services indicated at this time   OBJECTIVE IMPAIRMENTS:  none .   ACTIVITY LIMITATIONS:  none  PARTICIPATION LIMITATIONS:  none  PERSONAL FACTORS: Time since onset of injury/illness/exacerbation are also affecting patient's functional outcome.   REHAB POTENTIAL: Good  CLINICAL DECISION MAKING: Stable/uncomplicated  EVALUATION COMPLEXITY: Low  PLAN:  PT FREQUENCY: one time visit  PT DURATION:   PLANNED INTERVENTIONS: Evaluation only  PLAN FOR NEXT SESSION:    4:55 PM, 01/28/23 M. Sherlyn Lees, PT, DPT Physical Therapist- [place] Office Number: [phone]

## 2023-01-29 ENCOUNTER — Encounter: Payer: Self-pay | Admitting: Sports Medicine

## 2023-01-29 ENCOUNTER — Ambulatory Visit (INDEPENDENT_AMBULATORY_CARE_PROVIDER_SITE_OTHER): Payer: 59 | Admitting: Sports Medicine

## 2023-01-29 VITALS — BP 138/84 | HR 55

## 2023-01-29 DIAGNOSIS — R627 Adult failure to thrive: Secondary | ICD-10-CM | POA: Diagnosis not present

## 2023-01-29 DIAGNOSIS — E618 Deficiency of other specified nutrient elements: Secondary | ICD-10-CM

## 2023-01-29 DIAGNOSIS — E162 Hypoglycemia, unspecified: Secondary | ICD-10-CM | POA: Diagnosis not present

## 2023-01-29 DIAGNOSIS — D631 Anemia in chronic kidney disease: Secondary | ICD-10-CM | POA: Diagnosis not present

## 2023-01-29 DIAGNOSIS — E2740 Unspecified adrenocortical insufficiency: Secondary | ICD-10-CM | POA: Insufficient documentation

## 2023-01-29 DIAGNOSIS — E611 Iron deficiency: Secondary | ICD-10-CM

## 2023-01-29 MED ORDER — MIRTAZAPINE 15 MG PO TABS: ORAL_TABLET | ORAL | 3 refills | Status: DC

## 2023-01-29 NOTE — Assessment & Plan Note (Signed)
This is a pleasant 67 year old female, she was found down in her house with no recollection of passing out, per EMS she was hypothermic and hypoglycemic. She had a fairly extensive workup in the hospital, ultimately an a.m. cortisol reading was low. CT chest abdomen pelvis was negative with the exception of multiple sclerotic lesions that are known and are being monitored by her oncologist. As she did have a low cortisol level and was not on any hypoglycemic medications it was suggested that she does need a cosyntropin stimulation test, I would like endocrinology to weigh in here.

## 2023-01-29 NOTE — Assessment & Plan Note (Addendum)
Known alpha thalassemia, she is monitored by heme-onc. I would like to recheck her labs, she had a hemoglobin in the high nines at hospital discharge. She also has plans for follow-up with gastroenterology, she does need upper endoscopy and potentially colonoscopy if negative.

## 2023-01-29 NOTE — Progress Notes (Signed)
    Procedures performed today:    None.  Independent interpretation of notes and tests performed by another provider:   None.  Brief History, Exam, Impression, and Recommendations:    Hypoglycemia This is a pleasant 67 year old female, she was found down in her house with no recollection of passing out, per EMS she was hypothermic and hypoglycemic. She had a fairly extensive workup in the hospital, ultimately an a.m. cortisol reading was low. CT chest abdomen pelvis was negative with the exception of multiple sclerotic lesions that are known and are being monitored by her oncologist. As she did have a low cortisol level and was not on any hypoglycemic medications it was suggested that she does need a cosyntropin stimulation test, I would like endocrinology to weigh in here.  Erythropoietin deficiency anemia Known alpha thalassemia, she is monitored by heme-onc. I would like to recheck her labs, she had a hemoglobin in the high nines at hospital discharge. She also has plans for follow-up with gastroenterology, she does need upper endoscopy and potentially colonoscopy if negative.  I spent 30 minutes of total time managing this patient today, this includes chart review, face to face, and non-face to face time.  ____________________________________________ Gwen Her. Dianah Field, M.D., ABFM., CAQSM., AME. Primary Care and Sports Medicine [facility]  Adjunct Professor of [place] [employer] [facility] [employer]  Risk manager

## 2023-01-30 LAB — RETICULOCYTES
ABS Retic: 35500 cells/uL (ref 20000–80000)
Retic Ct Pct: 1 %

## 2023-01-30 NOTE — Addendum Note (Signed)
Addended by: Tarri Glenn A on: 01/30/2023 08:35 AM   Modules accepted: Orders

## 2023-01-30 NOTE — Addendum Note (Signed)
Addended by: Royetta Car on: 01/30/2023 08:33 AM   Modules accepted: Orders

## 2023-01-30 NOTE — Transitions of Care (Post Inpatient/ED Visit) (Signed)
   01/30/2023  Name: Ovida Delagarza MRN: [id number] DOB: [birth year]  Today's TOC FU Call Status: Today's TOC FU Call Status:: Successful TOC FU Call Competed TOC FU Call Complete Date: 01/27/23  Transition Care Management Follow-up Telephone Call Date of Discharge: 01/24/23 Primary Inpatient Discharge Diagnosis:: altered mental status How have you been since you were released from the hospital?: Better Any questions or concerns?: No  Items Reviewed: Did you receive and understand the discharge instructions provided?: Yes Medications obtained and verified?: Yes (Medications Reviewed) Any new allergies since your discharge?: No Dietary orders reviewed?: Yes Do you have support at home?: Yes People in Home: child(ren), adult  Home Care and Equipment/Supplies: [place] Ordered?: No Any new equipment or medical supplies ordered?: No  Functional Questionnaire: Do you need assistance with bathing/showering or dressing?: No Do you need assistance with meal preparation?: No Do you need assistance with eating?: No Do you have difficulty maintaining continence: No Do you need assistance with getting out of bed/getting out of a chair/moving?: No Do you have difficulty managing or taking your medications?: No  Folllow up appointments reviewed: PCP Follow-up appointment confirmed?: Yes MD Provider Line [phone] Given: Yes Date of PCP follow-up appointment?: 01/29/23 Follow-up Provider: [facility] Follow-up appointment confirmed?: No Do you need transportation to your follow-up appointment?: No Do you understand care options if your condition(s) worsen?: Yes-patient verbalized understanding    [place], [place] Direct Dial [phone]

## 2023-01-31 LAB — CBC WITH DIFFERENTIAL/PLATELET
Absolute Monocytes: 424 cells/uL (ref 200–950)
Basophils Absolute: 22 cells/uL (ref 0–200)
Basophils Relative: 0.4 %
Eosinophils Absolute: 204 cells/uL (ref 15–500)
Eosinophils Relative: 3.7 %
HCT: 27.6 % — ABNORMAL LOW (ref 35.0–45.0)
Hemoglobin: 8.4 g/dL — ABNORMAL LOW (ref 11.7–15.5)
Lymphs Abs: 2041 cells/uL (ref 850–3900)
MCH: 23.3 pg — ABNORMAL LOW (ref 27.0–33.0)
MCHC: 30.4 g/dL — ABNORMAL LOW (ref 32.0–36.0)
MCV: 76.5 fL — ABNORMAL LOW (ref 80.0–100.0)
MPV: 9.2 fL (ref 7.5–12.5)
Monocytes Relative: 7.7 %
Neutro Abs: 2811 cells/uL (ref 1500–7800)
Neutrophils Relative %: 51.1 %
Platelets: 177 10*3/uL (ref 140–400)
RBC: 3.61 10*6/uL — ABNORMAL LOW (ref 3.80–5.10)
RDW: 16.3 % — ABNORMAL HIGH (ref 11.0–15.0)
Total Lymphocyte: 37.1 %
WBC: 5.5 10*3/uL (ref 3.8–10.8)

## 2023-01-31 LAB — COMPLETE METABOLIC PANEL WITH GFR
AG Ratio: 1.2 (calc) (ref 1.0–2.5)
ALT: 24 U/L (ref 6–29)
AST: 28 U/L (ref 10–35)
Albumin: 3.2 g/dL — ABNORMAL LOW (ref 3.6–5.1)
Alkaline phosphatase (APISO): 58 U/L (ref 37–153)
BUN/Creatinine Ratio: 22 (calc) (ref 6–22)
BUN: 28 mg/dL — ABNORMAL HIGH (ref 7–25)
CO2: 31 mmol/L (ref 20–32)
Calcium: 8.8 mg/dL (ref 8.6–10.4)
Chloride: 104 mmol/L (ref 98–110)
Creat: 1.3 mg/dL — ABNORMAL HIGH (ref 0.50–1.05)
Globulin: 2.6 g/dL (calc) (ref 1.9–3.7)
Glucose, Bld: 76 mg/dL (ref 65–99)
Potassium: 4.3 mmol/L (ref 3.5–5.3)
Sodium: 143 mmol/L (ref 135–146)
Total Bilirubin: 0.3 mg/dL (ref 0.2–1.2)
Total Protein: 5.8 g/dL — ABNORMAL LOW (ref 6.1–8.1)
eGFR: 45 mL/min/{1.73_m2} — ABNORMAL LOW (ref >=60)

## 2023-01-31 LAB — IRON,TIBC AND FERRITIN PANEL
%SAT: 42 % (calc) (ref 16–45)
Ferritin: 485 ng/mL — ABNORMAL HIGH (ref 16–288)
Iron: 78 ug/dL (ref 45–160)
TIBC: 187 mcg/dL (calc) — ABNORMAL LOW (ref 250–450)

## 2023-01-31 LAB — B12 AND FOLATE PANEL
Folate: 10.6 ng/mL
Vitamin B-12: 522 pg/mL (ref 200–1100)

## 2023-02-03 ENCOUNTER — Telehealth: Payer: Self-pay | Admitting: Sports Medicine

## 2023-02-03 DIAGNOSIS — R32 Unspecified urinary incontinence: Secondary | ICD-10-CM | POA: Insufficient documentation

## 2023-02-03 NOTE — Telephone Encounter (Signed)
Returned patients son Trudee Grip) call back, son requests referral for Urology. Patient has some concerns with incontinence. Please advise.

## 2023-02-03 NOTE — Telephone Encounter (Signed)
Referral, clinical notes and copies of insurance cards have been faxed to [facility] at [phone]. Office will contact patient to schedule referral appointment.

## 2023-02-03 NOTE — Telephone Encounter (Signed)
Not a problem, referral placed to [facility].

## 2023-02-26 ENCOUNTER — Ambulatory Visit: Payer: 59 | Admitting: Sports Medicine

## 2023-03-07 ENCOUNTER — Ambulatory Visit: Payer: 59 | Admitting: Sports Medicine

## 2023-03-11 ENCOUNTER — Other Ambulatory Visit: Payer: Self-pay | Admitting: Endocrinology

## 2023-03-11 DIAGNOSIS — E2740 Unspecified adrenocortical insufficiency: Secondary | ICD-10-CM

## 2023-03-18 ENCOUNTER — Ambulatory Visit: Payer: 59 | Admitting: Sports Medicine

## 2023-08-01 ENCOUNTER — Emergency Department (HOSPITAL_COMMUNITY)
Admission: EM | Admit: 2023-08-01 | Discharge: 2023-08-01 | Disposition: A | Discharge status: Home / Self Care | Payer: 59 | Attending: Emergency Medicine | Admitting: Emergency Medicine

## 2023-08-01 ENCOUNTER — Encounter (HOSPITAL_COMMUNITY): Payer: Self-pay

## 2023-08-01 DIAGNOSIS — I10 Essential (primary) hypertension: Secondary | ICD-10-CM | POA: Diagnosis not present

## 2023-08-01 DIAGNOSIS — Z79899 Other long term (current) drug therapy: Secondary | ICD-10-CM | POA: Insufficient documentation

## 2023-08-01 DIAGNOSIS — Z8639 Personal history of other endocrine, nutritional and metabolic disease: Secondary | ICD-10-CM | POA: Insufficient documentation

## 2023-08-01 DIAGNOSIS — R531 Weakness: Secondary | ICD-10-CM | POA: Diagnosis present

## 2023-08-01 DIAGNOSIS — E162 Hypoglycemia, unspecified: Secondary | ICD-10-CM | POA: Insufficient documentation

## 2023-08-01 LAB — CBG MONITORING, ED
Glucose-Capillary: 68 mg/dL — ABNORMAL LOW (ref 70–99)
Glucose-Capillary: 43 mg/dL — CL (ref 70–99)
Glucose-Capillary: 94 mg/dL (ref 70–99)
Glucose-Capillary: 106 mg/dL — ABNORMAL HIGH (ref 70–99)

## 2023-08-01 LAB — BASIC METABOLIC PANEL
Anion gap: 12 (ref 5–15)
BUN: 21 mg/dL (ref 8–23)
CO2: 23 mmol/L (ref 22–32)
Calcium: 9.3 mg/dL (ref 8.9–10.3)
Chloride: 102 mmol/L (ref 98–111)
Creatinine, Ser: 1.33 mg/dL — ABNORMAL HIGH (ref 0.44–1.00)
GFR, Estimated: 43 mL/min — ABNORMAL LOW (ref >60)
Glucose, Bld: 65 mg/dL — ABNORMAL LOW (ref 70–99)
Potassium: 4.5 mmol/L (ref 3.5–5.1)
Sodium: 137 mmol/L (ref 135–145)

## 2023-08-01 LAB — URINALYSIS, ROUTINE W REFLEX MICROSCOPIC
Bilirubin Urine: NEGATIVE
Glucose, UA: NEGATIVE mg/dL
Hgb urine dipstick: NEGATIVE
Ketones, ur: 5 mg/dL — AB
Nitrite: NEGATIVE
Protein, ur: NEGATIVE mg/dL
Specific Gravity, Urine: 1.008 (ref 1.005–1.030)
pH: 7 (ref 5.0–8.0)

## 2023-08-01 LAB — CBC
HCT: 34.9 % — ABNORMAL LOW (ref 36.0–46.0)
Hemoglobin: 10.5 g/dL — ABNORMAL LOW (ref 12.0–15.0)
MCH: 23.5 pg — ABNORMAL LOW (ref 26.0–34.0)
MCHC: 30.1 g/dL (ref 30.0–36.0)
MCV: 78.3 fL — ABNORMAL LOW (ref 80.0–100.0)
Platelets: 221 10*3/uL (ref 150–400)
RBC: 4.46 MIL/uL (ref 3.87–5.11)
RDW: 14.2 % (ref 11.5–15.5)
WBC: 3 10*3/uL — ABNORMAL LOW (ref 4.0–10.5)
nRBC: 0 % (ref 0.0–0.2)

## 2023-08-01 LAB — MAGNESIUM: Magnesium: 2.1 mg/dL (ref 1.7–2.4)

## 2023-08-01 MED ORDER — LANCET DEVICE MISC: 1.0000 | Freq: Three times a day (TID) | 0 refills | Status: AC

## 2023-08-01 MED ORDER — DEXTROSE 50 % IV SOLN
25.0000 mL | Freq: Once | INTRAVENOUS | Status: AC
  Administered 2023-08-01: 25 mL via INTRAVENOUS
  Filled 2023-08-01: qty 50

## 2023-08-01 MED ORDER — HYDROCORTISONE SOD SUC (PF) 100 MG IJ SOLR
100.0000 mg | Freq: Once | INTRAMUSCULAR | Status: AC
  Administered 2023-08-01: 100 mg via INTRAVENOUS
  Filled 2023-08-01: qty 2

## 2023-08-01 MED ORDER — BLOOD GLUCOSE TEST VI STRP: 1.0000 | ORAL_STRIP | Freq: Three times a day (TID) | 0 refills | Status: AC

## 2023-08-01 MED ORDER — BLOOD GLUCOSE MONITORING SUPPL DEVI: 1.0000 | Freq: Three times a day (TID) | 0 refills | Status: AC

## 2023-08-01 MED ORDER — LANCETS MISC. MISC: 1.0000 | Freq: Three times a day (TID) | 0 refills | Status: AC

## 2023-08-01 MED ORDER — HYDROCORTISONE 10 MG PO TABS: 10.0000 mg | ORAL_TABLET | Freq: Every day | ORAL | 0 refills | Status: AC

## 2023-08-01 NOTE — ED Notes (Signed)
Pt sugar noted to be 43. RN notified and patient provided with orange juice, graham crackers and peanut butter.Will recheck sugar in an hour.

## 2023-08-01 NOTE — ED Provider Notes (Signed)
[name] EMERGENCY DEPARTMENT AT [facility] Provider Note   CSN: [id number] Arrival date & time: 08/01/23  1527     History  Chief Complaint  Patient presents with   Weakness    [name] is a 67 y.o. female.  Patient is a 67 year old female with a past medical history of hypertension, alpha thalassemia and suspected mild adrenal insufficiency presenting to the emergency department for hypoglycemia.  Patient is here with her family who states that they were trying to call her earlier in the day and that she was not answering.  When they went to her house she was confused not acting like herself.  They state that she had a similar episode to this in the past when she had hypoglycemia so they gave her some juice with some improvement to her mental status and brought her to the emergency department.  The patient states that she has missed some meds recently and is unsure if she has been taking a steroid.  She states that she has been eating and drinking normally and has had no recent fevers, nausea, vomiting or diarrhea.  She denies any cough or shortness of breath.  The history is provided by the patient and a relative.  Weakness      Home Medications Prior to Admission medications   Medication Sig Start Date End Date Taking? Authorizing Provider  Blood Glucose Monitoring Suppl DEVI 1 each by Does not apply route in the morning, at noon, and at bedtime. May substitute to any manufacturer covered by patient's insurance. 08/01/23  Yes Theresia Lo, [place] K, DO  Glucose Blood (BLOOD GLUCOSE TEST STRIPS) STRP 1 each by In Vitro route in the morning, at noon, and at bedtime. May substitute to any manufacturer covered by patient's insurance. 08/01/23 09/03/23 Yes Theresia Lo, Benetta Spar K, DO  hydrocortisone (CORTEF) 10 MG tablet Take 1 tablet (10 mg total) by mouth daily. 08/01/23 08/31/23 Yes Rexford Maus, DO  Lancet Device MISC 1 each by Does not apply route in the morning,  at noon, and at bedtime. May substitute to any manufacturer covered by patient's insurance. 08/01/23 08/31/23 Yes Elayne Snare K, DO  Lancets Misc. MISC 1 each by Does not apply route in the morning, at noon, and at bedtime. May substitute to any manufacturer covered by patient's insurance. 08/01/23 08/31/23 Yes Theresia Lo, Benetta Spar K, DO  amLODipine (NORVASC) 5 MG tablet Take 1 tablet (5 mg total) by mouth daily. 01/24/23   Danford, Earl Lites, MD  ECHINACEA PO Take 1 capsule by mouth daily.    [provider]  Elderberry 500 MG CAPS Take 500 mg by mouth daily.    [provider]  Flaxseed, Linseed, (FLAXSEED OIL) 1200 MG CAPS Take 1,200 mg by mouth daily.    [provider]  mirtazapine (REMERON) 15 MG tablet 0.5 tablets daily for a week then 1 tab p.o. daily 01/29/23   Monica Becton, MD  Multiple Vitamin (MULTIVITAMIN WITH MINERALS) TABS tablet Take 1 tablet by mouth daily. 05/26/22   Calvert Cantor, MD      Allergies    Isovue [iopamidol] and Iodine-131    Review of Systems   Review of Systems  Neurological:  Positive for weakness.    Physical Exam Updated Vital Signs BP (!) 161/88   Pulse (!) 56   Temp 98.6 F (37 C) (Oral)   Resp 17   SpO2 100%  Physical Exam Vitals and nursing note reviewed.  Constitutional:  General: She is not in acute distress.    Appearance: Normal appearance.  HENT:     Head: Normocephalic and atraumatic.     Mouth/Throat:     Mouth: Mucous membranes are moist.     Pharynx: Oropharynx is clear.  Eyes:     Extraocular Movements: Extraocular movements intact.     Conjunctiva/sclera: Conjunctivae normal.  Cardiovascular:     Rate and Rhythm: Normal rate and regular rhythm.     Heart sounds: Normal heart sounds.  Pulmonary:     Effort: Pulmonary effort is normal.     Breath sounds: Normal breath sounds.  Abdominal:     General: Abdomen is flat.     Palpations: Abdomen is soft.     Tenderness: There is no  abdominal tenderness.  Musculoskeletal:        General: Normal range of motion.     Cervical back: Normal range of motion.  Skin:    General: Skin is warm and dry.  Neurological:     General: No focal deficit present.     Mental Status: She is alert and oriented to person, place, and time.  Psychiatric:        Mood and Affect: Mood normal.        Behavior: Behavior normal.     ED Results / Procedures / Treatments   Labs (all labs ordered are listed, but only abnormal results are displayed) Labs Reviewed  BASIC METABOLIC PANEL - Abnormal; Notable for the following components:      Result Value   Glucose, Bld 65 (*)    Creatinine, Ser 1.33 (*)    GFR, Estimated 43 (*)    All other components within normal limits  CBC - Abnormal; Notable for the following components:   WBC 3.0 (*)    Hemoglobin 10.5 (*)    HCT 34.9 (*)    MCV 78.3 (*)    MCH 23.5 (*)    All other components within normal limits  URINALYSIS, ROUTINE W REFLEX MICROSCOPIC - Abnormal; Notable for the following components:   Color, Urine STRAW (*)    Ketones, ur 5 (*)    Leukocytes,Ua SMALL (*)    Bacteria, UA RARE (*)    All other components within normal limits  CBG MONITORING, ED - Abnormal; Notable for the following components:   Glucose-Capillary 68 (*)    All other components within normal limits  CBG MONITORING, ED - Abnormal; Notable for the following components:   Glucose-Capillary 43 (*)    All other components within normal limits  CBG MONITORING, ED - Abnormal; Notable for the following components:   Glucose-Capillary 106 (*)    All other components within normal limits  MAGNESIUM  CBG MONITORING, ED    EKG EKG Interpretation Date/Time:  Friday August 01 2023 16:02:38 EDT Ventricular Rate:  56 PR Interval:  162 QRS Duration:  84 QT Interval:  496 QTC Calculation: 478 R Axis:   63  Text Interpretation: Sinus bradycardia Nonspecific T wave abnormality Abnormal ECG When compared with ECG  of 19-Jan-2023 18:03, PREVIOUS ECG IS PRESENT Confirmed by Elayne Snare ([id number]) on 08/01/2023 4:43:04 PM  Radiology No results found.  Procedures .Critical Care  Performed by: Rexford Maus, DO Authorized by: Rexford Maus, DO   Critical care provider statement:    Critical care time (minutes):  40   Critical care time was exclusive of:  Separately billable procedures and treating other patients   Critical care was necessary to  treat or prevent imminent or life-threatening deterioration of the following conditions:  Endocrine crisis   Critical care was time spent personally by me on the following activities:  Development of treatment plan with patient or surrogate, evaluation of patient's response to treatment, examination of patient, obtaining history from patient or surrogate, ordering and performing treatments and interventions, ordering and review of laboratory studies, ordering and review of radiographic studies, pulse oximetry, re-evaluation of patient's condition and review of old charts   I assumed direction of critical care for this patient from another provider in my specialty: no       Medications Ordered in ED Medications  dextrose 50 % solution 25 mL (25 mLs Intravenous Given 08/01/23 1708)  hydrocortisone sodium succinate (SOLU-CORTEF) 100 MG injection 100 mg (100 mg Intravenous Given 08/01/23 1827)    ED Course/ Medical Decision Making/ A&P Clinical Course as of 08/01/23 1933  Fri Aug 01, 2023  1803 Repeat glucose improved after D50 and eating. Will give hydrocortisone and continue to monitor. [VK]  1914 Rpt glucose has remained stable after receiving stable and patient eating. Patient will be educated on importance of taking steroids consistently to prevent recurrent episodes of hypoglycemia. She is otherwise stable for discharge home with outpatient follow up. [VK]  1929 Patient reports she does not think she has cortef at home and will be given refill of  her prescription. [VK]    Clinical Course User Index [VK] Rexford Maus, DO                                 Medical Decision Making This patient presents to the ED with chief complaint(s) of hypoglycemia, AMS with pertinent past medical history of suspected adrenal insufficiency, alpha thalassemia, HTN which further complicates the presenting complaint. The complaint involves an extensive differential diagnosis and also carries with it a high risk of complications and morbidity.    The differential diagnosis includes adrenal crisis, hypoglycemia, infection, electrolyte abnormality, dehydration  Additional history obtained: Additional history obtained from family Records reviewed previous admission documents and Primary Care Documents  ED Course and Reassessment: On patient's arrival to the emergency department she was hemodynamically stable in no acute distress.  Initial blood sugar was 68 and on recheck after 1 hour had dropped to 43 and she was immediately brought back to a room.  She was given 25 mL of D5 and given juice and crackers to eat.  Her mental status is maintained stable since.  Suspect likely due to missed steroids and will give her a dose of hydrocortisone here and will have labs including urine and close blood sugar rechecked.  Independent labs interpretation:  The following labs were independently interpreted: hypoglycemia, otherwise within normal range  Independent visualization of imaging: -N/A  Consultation: - Consulted or discussed management/test interpretation w/ external professional: N/A  Consideration for admission or further workup: Patient has no emergent conditions requiring admission or further work-up at this time and is stable for discharge home with primary care follow-up  Social Determinants of health: N/A    Amount and/or Complexity of Data Reviewed Labs: ordered.  Risk OTC drugs. Prescription drug management.          Final  Clinical Impression(s) / ED Diagnoses Final diagnoses:  Hypoglycemia  History of adrenal insufficiency    Rx / DC Orders ED Discharge Orders          Ordered    hydrocortisone (  CORTEF) 10 MG tablet  Daily        08/01/23 1931    Blood Glucose Monitoring Suppl DEVI  3 times daily        08/01/23 1932    Glucose Blood (BLOOD GLUCOSE TEST STRIPS) STRP  3 times daily        08/01/23 1932    Lancet Device MISC  3 times daily        08/01/23 1932    Lancets Misc. MISC  3 times daily        08/01/23 1932              Rexford Maus, [place] 08/01/23 1933

## 2023-08-01 NOTE — Discharge Instructions (Signed)
You were seen in the emergency department for your low blood sugars.  When you were previously in the emergency department your cortisol levels were low and it appears that you should be taking a steroid every day called hydrocortisone.  We gave you a dose through the IV in the emergency department and I have given you a refill of your medication and you should start taking this every day as prescribed.  You can follow-up with your primary doctor or your endocrinologist to have your symptoms rechecked and for further refills.  You should return to the emergency department if you have further episodes of low blood sugars, you are confused or drowsy and hard to wake up or if you have any other new or concerning symptoms.

## 2023-08-01 NOTE — ED Triage Notes (Signed)
Pt is brought in by family for supposed low blood sugar. Family reports a altered mentation today, and they gave her some orange juice and they stated she started to return to normal. She currently can answer all orientation questions, states she doe snot have any other symptoms such as neurological, chest pain, shortness of breath. Does mention she is a little cold but otherwise appears stable.

## 2023-08-04 ENCOUNTER — Telehealth: Payer: Self-pay | Admitting: General Practice

## 2023-08-04 NOTE — Transitions of Care (Post Inpatient/ED Visit) (Signed)
   08/04/2023  Name: [name] MRN: [id number] DOB: [birth year]  Today's TOC FU Call Status: Today's TOC FU Call Status:: Unsuccessful Call (1st Attempt) Unsuccessful Call (1st Attempt) Date: 08/04/23  Attempted to reach the patient regarding the most recent Inpatient/ED visit.  Follow Up Plan: Additional outreach attempts will be made to reach the patient to complete the Transitions of Care (Post Inpatient/ED visit) call.   Signature Modesto Charon, Control and instrumentation engineer

## 2023-08-05 NOTE — Transitions of Care (Post Inpatient/ED Visit) (Signed)
   08/05/2023  Name: [name] MRN: [id number] DOB: [birth year]  Today's TOC FU Call Status: Today's TOC FU Call Status:: Unsuccessful Call (2nd Attempt) Unsuccessful Call (1st Attempt) Date: 08/04/23 Unsuccessful Call (2nd Attempt) Date: 08/05/23  Attempted to reach the patient regarding the most recent Inpatient/ED visit.  Follow Up Plan: Additional outreach attempts will be made to reach the patient to complete the Transitions of Care (Post Inpatient/ED visit) call.   Signature Modesto Charon, Control and instrumentation engineer

## 2023-08-06 NOTE — Transitions of Care (Post Inpatient/ED Visit) (Signed)
   08/06/2023  Name: [name] MRN: [id number] DOB: April 06, 1956  Today's TOC FU Call Status: Today's TOC FU Call Status:: Unsuccessful Call (3rd Attempt) Unsuccessful Call (1st Attempt) Date: 08/04/23 Unsuccessful Call (2nd Attempt) Date: 08/05/23 Unsuccessful Call (3rd Attempt) Date: 08/06/23  Attempted to reach the patient regarding the most recent Inpatient/ED visit.  Follow Up Plan: No further outreach attempts will be made at this time. We have been unable to contact the patient.  Signature Modesto Charon, Control and instrumentation engineer

## 2023-08-12 ENCOUNTER — Ambulatory Visit: Payer: 59 | Admitting: Sports Medicine

## 2023-08-14 ENCOUNTER — Encounter: Payer: Self-pay | Admitting: Sports Medicine

## 2023-08-14 ENCOUNTER — Ambulatory Visit (INDEPENDENT_AMBULATORY_CARE_PROVIDER_SITE_OTHER): Payer: 59 | Admitting: Sports Medicine

## 2023-08-14 DIAGNOSIS — E162 Hypoglycemia, unspecified: Secondary | ICD-10-CM | POA: Diagnosis not present

## 2023-08-14 DIAGNOSIS — R627 Adult failure to thrive: Secondary | ICD-10-CM

## 2023-08-14 MED ORDER — MIRTAZAPINE 15 MG PO TABS: ORAL_TABLET | ORAL | 3 refills | Status: AC

## 2023-08-14 NOTE — Assessment & Plan Note (Signed)
Please see prior notes, this is a pleasant 67 year old female, she has been found down a couple times in her house with no recollection of passing out, per EMS report she was hypothermic and hypoglycemic, she has had extensive workups in the hospital a couple of times now, ultimately several morning cortisol readings were low. CT abdomen and pelvis, chest was negative with the exception of some sclerotic lesions that are being monitored by her hematology/oncologist. She did see Dr. Talmage Nap with endocrinology, cosyntropin stimulation test was recommended. She never followed through with this. She ended up in the hospital again, and was discharged on Cortef. She was again informed of the importance of seeing her endocrinologist. As she has had some failure to thrive we did add mirtazapine which she also tells me she is really not been taking consistently, I will be refilling this. Return to see me after her visit with Dr. Talmage Nap.

## 2023-08-14 NOTE — Progress Notes (Signed)
    Procedures performed today:    None.  Independent interpretation of notes and tests performed by another provider:   None.  Brief History, Exam, Impression, and Recommendations:    Hypoglycemia Please see prior notes, this is a pleasant 67 year old female, she has been found down a couple times in her house with no recollection of passing out, per EMS report she was hypothermic and hypoglycemic, she has had extensive workups in the hospital a couple of times now, ultimately several morning cortisol readings were low. CT abdomen and pelvis, chest was negative with the exception of some sclerotic lesions that are being monitored by her hematology/oncologist. She did see Dr. Talmage Nap with endocrinology, cosyntropin stimulation test was recommended. She never followed through with this. She ended up in the hospital again, and was discharged on Cortef. She was again informed of the importance of seeing her endocrinologist. As she has had some failure to thrive we did add mirtazapine which she also tells me she is really not been taking consistently, I will be refilling this. Return to see me after her visit with Dr. Talmage Nap.    ____________________________________________ Ihor Austin. Benjamin Stain, M.D., ABFM., CAQSM., AME. Primary Care and Sports Medicine [facility]  Adjunct Professor of Family Medicine  [employer]  Restaurant manager, fast food

## 2023-08-14 NOTE — Patient Instructions (Signed)
(  P) [phone]  (F) [phone]  (E) [email or web address]  (A) [address], [place], [place], [address]

## 2023-08-18 ENCOUNTER — Encounter (HOSPITAL_COMMUNITY): Payer: Self-pay

## 2023-08-18 ENCOUNTER — Other Ambulatory Visit: Payer: Self-pay

## 2023-08-18 ENCOUNTER — Emergency Department (HOSPITAL_COMMUNITY)
Admission: EM | Admit: 2023-08-18 | Discharge: 2023-08-19 | Disposition: A | Discharge status: Home / Self Care | Payer: 59 | Attending: Emergency Medicine | Admitting: Emergency Medicine

## 2023-08-18 DIAGNOSIS — Z794 Long term (current) use of insulin: Secondary | ICD-10-CM | POA: Diagnosis not present

## 2023-08-18 DIAGNOSIS — E162 Hypoglycemia, unspecified: Secondary | ICD-10-CM | POA: Diagnosis present

## 2023-08-18 DIAGNOSIS — Z1152 Encounter for screening for COVID-19: Secondary | ICD-10-CM | POA: Diagnosis not present

## 2023-08-18 LAB — CBG MONITORING, ED
Glucose-Capillary: 197 mg/dL — ABNORMAL HIGH (ref 70–99)
Glucose-Capillary: 24 mg/dL — CL (ref 70–99)
Glucose-Capillary: 184 mg/dL — ABNORMAL HIGH (ref 70–99)

## 2023-08-18 LAB — RESP PANEL BY RT-PCR (RSV, FLU A&B, COVID)  RVPGX2
Influenza A by PCR: NEGATIVE
Influenza B by PCR: NEGATIVE
Resp Syncytial Virus by PCR: NEGATIVE
SARS Coronavirus 2 by RT PCR: NEGATIVE

## 2023-08-18 MED ORDER — DEXTROSE 50 % IV SOLN
1.0000 | Freq: Once | INTRAVENOUS | Status: AC
  Administered 2023-08-18: 50 mL via INTRAVENOUS

## 2023-08-18 MED ORDER — BD GLUCOSE 5 G PO CHEW: 15.0000 g | CHEWABLE_TABLET | ORAL | 12 refills | Status: AC | PRN

## 2023-08-18 NOTE — ED Notes (Signed)
Pt cbg at 197. Pt alert, family at bedside. EDP notified.

## 2023-08-18 NOTE — Discharge Instructions (Signed)
Continue taking all medications as prescribed.  Make sure that you are getting plenty to eat and drink.  If you get to feel weak, make sure that you drink some juice.  Follow-up with your primary care doctor this week.

## 2023-08-18 NOTE — ED Triage Notes (Signed)
Pt arrives with c/o hypoglycemia. Family reports fatigue and tiredness. Pt is not a diabetic and does not take diabetic meds. Pt is a&ox4, but sleepy. Pt has had episodes like this before.

## 2023-08-18 NOTE — ED Provider Notes (Signed)
[place] EMERGENCY DEPARTMENT AT [facility] Provider Note   CSN: [id number] Arrival date & time: 08/18/23  2130     History  Chief Complaint  Patient presents with   Hypoglycemia    [name] is a 67 y.o. female.  This is a 67 year old female is here today for hypoglycemia.  Patient has had previous issues with hypoglycemia, hospitalizations, and endocrinology workup.  Her sister states that the patient seemed very confused, and thought that her blood sugar was probably low.  When she arrived to the emergency department, her glucose was 24.  The sister says that the patient does not eat.   Hypoglycemia      Home Medications Prior to Admission medications   Medication Sig Start Date End Date Taking? Authorizing Provider  amLODipine (NORVASC) 5 MG tablet Take 1 tablet (5 mg total) by mouth daily. 01/24/23   Danford, Earl Lites, MD  Blood Glucose Monitoring Suppl DEVI 1 each by Does not apply route in the morning, at noon, and at bedtime. May substitute to any manufacturer covered by patient's insurance. 08/01/23   Elayne Snare K, DO  ECHINACEA PO Take 1 capsule by mouth daily.    [provider]  Elderberry 500 MG CAPS Take 500 mg by mouth daily.    [provider]  Flaxseed, Linseed, (FLAXSEED OIL) 1200 MG CAPS Take 1,200 mg by mouth daily.    [provider]  Glucose Blood (BLOOD GLUCOSE TEST STRIPS) STRP 1 each by In Vitro route in the morning, at noon, and at bedtime. May substitute to any manufacturer covered by patient's insurance. 08/01/23 09/03/23  Rexford Maus, DO  hydrocortisone (CORTEF) 10 MG tablet Take 1 tablet (10 mg total) by mouth daily. 08/01/23 08/31/23  Rexford Maus, DO  Lancet Device MISC 1 each by Does not apply route in the morning, at noon, and at bedtime. May substitute to any manufacturer covered by patient's insurance. 08/01/23 08/31/23  Rexford Maus, DO  Lancets Misc. MISC 1 each by Does  not apply route in the morning, at noon, and at bedtime. May substitute to any manufacturer covered by patient's insurance. 08/01/23 08/31/23  Rexford Maus, DO  mirtazapine (REMERON) 15 MG tablet 0.5 tablets daily for a week then 1 tab p.o. daily 08/14/23   Monica Becton, MD  Multiple Vitamin (MULTIVITAMIN WITH MINERALS) TABS tablet Take 1 tablet by mouth daily. 05/26/22   Calvert Cantor, MD      Allergies    Isovue [iopamidol] and Iodine-131    Review of Systems   Review of Systems  Physical Exam Updated Vital Signs BP (!) 161/102 (BP Location: Right Arm)   Pulse 74   Resp 15   Wt 50.3 kg   SpO2 99%   BMI 20.97 kg/m  Physical Exam Vitals reviewed.  Constitutional:      Comments: Somnolent  Eyes:     Extraocular Movements: Extraocular movements intact.     Pupils: Pupils are equal, round, and reactive to light.  Skin:    General: Skin is warm and dry.     ED Results / Procedures / Treatments   Labs (all labs ordered are listed, but only abnormal results are displayed) Labs Reviewed  CBG MONITORING, ED - Abnormal; Notable for the following components:      Result Value   Glucose-Capillary 24 (*)    All other components within normal limits  CBG MONITORING, ED - Abnormal; Notable for the following components:  Glucose-Capillary 197 (*)    All other components within normal limits  RESP PANEL BY RT-PCR (RSV, FLU A&B, COVID)  [id number]    EKG EKG Interpretation Date/Time:  Monday August 18 2023 21:59:08 EDT Ventricular Rate:  76 PR Interval:  253 QRS Duration:  85 QT Interval:  421 QTC Calculation: 474 R Axis:   66  Text Interpretation: Sinus rhythm Prolonged PR interval Low voltage, precordial leads Borderline T abnormalities, diffuse leads Confirmed by Anders Simmonds [phone]) on 08/18/2023 10:10:15 PM  Radiology No results found.  Procedures Procedures    Medications Ordered in ED Medications  dextrose 50 % solution 50 mL (50 mLs Intravenous  Given 08/18/23 2152)    ED Course/ Medical Decision Making/ A&P                                 Medical Decision Making 67 year old here today for an episode of hypoglycemia.  Plan-have provided patient with IV glucose.  She is now eating and drinking, responding normally.  No neurological deficits.   As this is a chronic issue for this patient, do not believe she requires hospitalization.  Patient is requesting a COVID test.  Will discharge patient after they get tested for COVID, they can follow for the results on MyChart.  Instructed the patient on regular glucose checks, making sure that she is getting enough to eat.  Risk Prescription drug management.           Final Clinical Impression(s) / ED Diagnoses Final diagnoses:  Hypoglycemia    Rx / DC Orders ED Discharge Orders     None         Arletha Pili, DO 08/18/23 2234

## 2023-08-19 ENCOUNTER — Telehealth: Payer: Self-pay | Admitting: General Practice

## 2023-08-19 NOTE — Transitions of Care (Post Inpatient/ED Visit) (Signed)
   08/19/2023  Name: [name] MRN: [id number] DOB: 1956/04/23  Today's TOC FU Call Status: Unsuccessful Call (1st Attempt) Date: 08/19/23  Attempted to reach the patient regarding the most recent Inpatient/ED visit.  Follow Up Plan: Additional outreach attempts will be made to reach the patient to complete the Transitions of Care (Post Inpatient/ED visit) call.   Signature Modesto Charon, Control and instrumentation engineer

## 2023-08-20 NOTE — Transitions of Care (Post Inpatient/ED Visit) (Signed)
   08/20/2023  Name: [name] MRN: [id number] DOB: Nov 26, 1956  Today's TOC FU Call Status: Unsuccessful Call (1st Attempt) Date: 08/19/23 Unsuccessful Call (2nd Attempt) Date: 08/20/23  Attempted to reach the patient regarding the most recent Inpatient/ED visit.  Follow Up Plan: Additional outreach attempts will be made to reach the patient to complete the Transitions of Care (Post Inpatient/ED visit) call.   Signature Modesto Charon, Control and instrumentation engineer

## 2023-08-25 NOTE — Transitions of Care (Post Inpatient/ED Visit) (Signed)
   08/25/2023  Name: [name] MRN: [id number] DOB: 09-23-1956  Today's TOC FU Call Status: Unsuccessful Call (1st Attempt) Date: 08/19/23 Unsuccessful Call (2nd Attempt) Date: 08/20/23 Unsuccessful Call (3rd Attempt) Date: 08/25/23  Attempted to reach the patient regarding the most recent Inpatient/ED visit.  Follow Up Plan: No further outreach attempts will be made at this time. We have been unable to contact the patient.  Signature Modesto Charon, Control and instrumentation engineer

## 2023-09-24 ENCOUNTER — Emergency Department (HOSPITAL_COMMUNITY)
Admission: EM | Admit: 2023-09-24 | Discharge: 2023-09-25 | Disposition: A | Discharge status: Home / Self Care | Payer: 59 | Attending: Emergency Medicine | Admitting: Emergency Medicine

## 2023-09-24 ENCOUNTER — Encounter (HOSPITAL_COMMUNITY): Payer: Self-pay | Admitting: Emergency Medicine

## 2023-09-24 DIAGNOSIS — I10 Essential (primary) hypertension: Secondary | ICD-10-CM | POA: Diagnosis not present

## 2023-09-24 DIAGNOSIS — R4182 Altered mental status, unspecified: Secondary | ICD-10-CM | POA: Diagnosis present

## 2023-09-24 DIAGNOSIS — Z79899 Other long term (current) drug therapy: Secondary | ICD-10-CM | POA: Insufficient documentation

## 2023-09-24 DIAGNOSIS — E162 Hypoglycemia, unspecified: Secondary | ICD-10-CM | POA: Insufficient documentation

## 2023-09-24 LAB — CBC WITH DIFFERENTIAL/PLATELET
Abs Immature Granulocytes: 0 10*3/uL (ref 0.00–0.07)
Basophils Absolute: 0.1 10*3/uL (ref 0.0–0.1)
Basophils Relative: 2 %
Eosinophils Absolute: 0.2 10*3/uL (ref 0.0–0.5)
Eosinophils Relative: 4 %
HCT: 30 % — ABNORMAL LOW (ref 36.0–46.0)
Hemoglobin: 8.8 g/dL — ABNORMAL LOW (ref 12.0–15.0)
Lymphocytes Relative: 24 %
Lymphs Abs: 1.1 10*3/uL (ref 0.7–4.0)
MCH: 22.7 pg — ABNORMAL LOW (ref 26.0–34.0)
MCHC: 29.3 g/dL — ABNORMAL LOW (ref 30.0–36.0)
MCV: 77.3 fL — ABNORMAL LOW (ref 80.0–100.0)
Monocytes Absolute: 0.2 10*3/uL (ref 0.1–1.0)
Monocytes Relative: 5 %
Neutro Abs: 2.9 10*3/uL (ref 1.7–7.7)
Neutrophils Relative %: 65 %
Platelets: 198 10*3/uL (ref 150–400)
RBC: 3.88 MIL/uL (ref 3.87–5.11)
RDW: 16.1 % — ABNORMAL HIGH (ref 11.5–15.5)
WBC: 4.5 10*3/uL (ref 4.0–10.5)
nRBC: 0 % (ref 0.0–0.2)
nRBC: 0 /100{WBCs}

## 2023-09-24 LAB — COMPREHENSIVE METABOLIC PANEL
ALT: 17 U/L (ref 0–44)
AST: 45 U/L — ABNORMAL HIGH (ref 15–41)
Albumin: 3.4 g/dL — ABNORMAL LOW (ref 3.5–5.0)
Alkaline Phosphatase: 82 U/L (ref 38–126)
Anion gap: 11 (ref 5–15)
BUN: 21 mg/dL (ref 8–23)
CO2: 25 mmol/L (ref 22–32)
Calcium: 9.2 mg/dL (ref 8.9–10.3)
Chloride: 101 mmol/L (ref 98–111)
Creatinine, Ser: 1.29 mg/dL — ABNORMAL HIGH (ref 0.44–1.00)
GFR, Estimated: 45 mL/min — ABNORMAL LOW (ref >60)
Glucose, Bld: 71 mg/dL (ref 70–99)
Potassium: 3.2 mmol/L — ABNORMAL LOW (ref 3.5–5.1)
Sodium: 137 mmol/L (ref 135–145)
Total Bilirubin: 0.8 mg/dL (ref 0.3–1.2)
Total Protein: 7 g/dL (ref 6.5–8.1)

## 2023-09-24 LAB — CBG MONITORING, ED
Glucose-Capillary: 62 mg/dL — ABNORMAL LOW (ref 70–99)
Glucose-Capillary: 76 mg/dL (ref 70–99)

## 2023-09-24 MED ORDER — GLUCOSE 40 % PO GEL
1.0000 | Freq: Once | ORAL | Status: AC
  Administered 2023-09-24: 31 g via ORAL
  Filled 2023-09-24: qty 1.21

## 2023-09-24 NOTE — ED Provider Notes (Signed)
EMERGENCY DEPARTMENT AT [facility] Provider Note   CSN: [id number] Arrival date & time: 09/24/23  2122     History {Add pertinent medical, surgical, social history, OB history to HPI:1} Chief Complaint  Patient presents with   Hypoglycemia    [name] is a 67 y.o. female.  HPI     This is a 67 year old female with recurrent history of hypoglycemia who presents with altered mental status.  Per EMS and the patient's son, patient was found altered.  He felt like her blood sugar was low.  Was able to give her oral glucose and mental status seemed to improve.  Patient does not have a history of diabetes but there has been some concern that she may have some mild adrenal insufficiency.  She has not followed up with endocrinology to have steroid stimulation testing.  She has been in the ER 3-4 times in the last year with similar presentation.  Every time she has responded well after receiving either IV dextrose or oral glucose.  On my evaluation, patient is awake and alert.  She has no complaints at this time.  She states she remembers going to work last night coming home to sleep and subsequently going into the kitchen to get something to eat.  Overall reports poor p.o. intake.  Denies recent illnesses.  Specifically denies chest pain, abdominal pain, shortness of breath.  Home Medications Prior to Admission medications   Medication Sig Start Date End Date Taking? Authorizing Provider  amLODipine (NORVASC) 5 MG tablet Take 1 tablet (5 mg total) by mouth daily. 01/24/23   Danford, Earl Lites, MD  Blood Glucose Monitoring Suppl DEVI 1 each by Does not apply route in the morning, at noon, and at bedtime. May substitute to any manufacturer covered by patient's insurance. 08/01/23   Elayne Snare K, DO  ECHINACEA PO Take 1 capsule by mouth daily.    [provider]  Elderberry 500 MG CAPS Take 500 mg by mouth daily.    [provider]  Flaxseed,  Linseed, (FLAXSEED OIL) 1200 MG CAPS Take 1,200 mg by mouth daily.    [provider]  glucose (B-D GLUCOSE) 5 g chewable tablet Chew 3 tablets (15 g total) by mouth as needed for low blood sugar. 08/18/23 08/17/24  Anders Simmonds T, DO  mirtazapine (REMERON) 15 MG tablet 0.5 tablets daily for a week then 1 tab p.o. daily 08/14/23   Monica Becton, MD  Multiple Vitamin (MULTIVITAMIN WITH MINERALS) TABS tablet Take 1 tablet by mouth daily. 05/26/22   Calvert Cantor, MD      Allergies    Isovue [iopamidol] and Iodine-131    Review of Systems   Review of Systems  Constitutional:  Negative for fever.  Respiratory:  Negative for shortness of breath.   Cardiovascular:  Negative for chest pain.  Gastrointestinal:  Negative for abdominal pain.  Psychiatric/Behavioral:  Positive for confusion.   All other systems reviewed and are negative.   Physical Exam Updated Vital Signs BP (!) 159/92   Pulse 66   Resp (!) 21   SpO2 99%  Physical Exam Vitals and nursing note reviewed.  Constitutional:      Appearance: She is well-developed.     Comments: Chronically ill-appearing but non toxic  HENT:     Head: Normocephalic and atraumatic.  Eyes:     Pupils: Pupils are equal, round, and reactive to light.  Cardiovascular:     Rate and Rhythm: Normal rate and  regular rhythm.     Heart sounds: Normal heart sounds.  Pulmonary:     Effort: Pulmonary effort is normal. No respiratory distress.     Breath sounds: No wheezing.  Abdominal:     Palpations: Abdomen is soft.     Tenderness: There is no abdominal tenderness.  Musculoskeletal:     Cervical back: Neck supple.  Skin:    General: Skin is warm and dry.  Neurological:     Mental Status: She is alert and oriented to person, place, and time.  Psychiatric:        Mood and Affect: Mood normal.     ED Results / Procedures / Treatments   Labs (all labs ordered are listed, but only abnormal results are displayed) Labs Reviewed   COMPREHENSIVE METABOLIC PANEL - Abnormal; Notable for the following components:      Result Value   Potassium 3.2 (*)    Creatinine, Ser 1.29 (*)    Albumin 3.4 (*)    AST 45 (*)    GFR, Estimated 45 (*)    All other components within normal limits  CBC WITH DIFFERENTIAL/PLATELET - Abnormal; Notable for the following components:   Hemoglobin 8.8 (*)    HCT 30.0 (*)    MCV 77.3 (*)    MCH 22.7 (*)    MCHC 29.3 (*)    RDW 16.1 (*)    All other components within normal limits  CBG MONITORING, ED - Abnormal; Notable for the following components:   Glucose-Capillary 62 (*)    All other components within normal limits  URINALYSIS, ROUTINE W REFLEX MICROSCOPIC  CBG MONITORING, ED    EKG None  Radiology No results found.  Procedures Procedures  {Document cardiac monitor, telemetry assessment procedure when appropriate:1}  Medications Ordered in ED Medications  dextrose (GLUTOSE) oral gel 40% (31 g Oral Given 09/24/23 2228)    ED Course/ Medical Decision Making/ A&P   {   Click here for ABCD2, HEART and other calculatorsREFRESH Note before signing :1}                              Medical Decision Making Amount and/or Complexity of Data Reviewed Labs: ordered.  Risk OTC drugs.   ***  {Document critical care time when appropriate:1} {Document review of labs and clinical decision tools ie heart score, Chads2Vasc2 etc:1}  {Document your independent review of radiology images, and any outside records:1} {Document your discussion with family members, caretakers, and with consultants:1} {Document social determinants of health affecting pt's care:1} {Document your decision making why or why not admission, treatments were needed:1} Final Clinical Impression(s) / ED Diagnoses Final diagnoses:  None    Rx / DC Orders ED Discharge Orders     None

## 2023-09-24 NOTE — ED Notes (Signed)
Unable to obtain temp 

## 2023-09-24 NOTE — ED Notes (Signed)
Patient tolerated drinking water with no issues. No coughing or gagging present.

## 2023-09-24 NOTE — ED Notes (Signed)
Pt appears lethargic. CBG low- unable to obtain temperature. Charge made aware.

## 2023-09-24 NOTE — ED Triage Notes (Addendum)
Son states mom was acting off today. She works 3rd shift and he works 1st so he was not home with her today. He states she did not respond when he got home and he felt like blood sugar low. He was able to get oral glucose shots in her and she seems to be coming around. She did not take her meds today for diabetes. She was resting when RN came in but roused to verbal stimuli. She appears pale but son says this is how she normally looks. She appears lethargic and cold to touch. Unable to obtain temp in triage.

## 2023-09-25 LAB — URINALYSIS, ROUTINE W REFLEX MICROSCOPIC
Bilirubin Urine: NEGATIVE
Glucose, UA: NEGATIVE mg/dL
Hgb urine dipstick: NEGATIVE
Ketones, ur: 5 mg/dL — AB
Nitrite: NEGATIVE
Protein, ur: NEGATIVE mg/dL
Specific Gravity, Urine: 1.01 (ref 1.005–1.030)
pH: 6 (ref 5.0–8.0)

## 2023-09-25 LAB — CBG MONITORING, ED: Glucose-Capillary: 116 mg/dL — ABNORMAL HIGH (ref 70–99)

## 2023-09-25 NOTE — Discharge Instructions (Signed)
You were again seen today for hypoglycemia.  It is very important that you follow-up with endocrinology for the steroid stimulation test.  In the meantime, frequent small meals and increasing oral intake are very important to prevent recurrent episodes of low blood sugar.

## 2023-11-07 ENCOUNTER — Ambulatory Visit (INDEPENDENT_AMBULATORY_CARE_PROVIDER_SITE_OTHER): Payer: 59 | Admitting: Sports Medicine

## 2023-11-07 ENCOUNTER — Ambulatory Visit: Payer: 59

## 2023-11-07 ENCOUNTER — Encounter: Payer: Self-pay | Admitting: Sports Medicine

## 2023-11-07 DIAGNOSIS — M545 Low back pain, unspecified: Secondary | ICD-10-CM | POA: Diagnosis not present

## 2023-11-07 DIAGNOSIS — J069 Acute upper respiratory infection, unspecified: Secondary | ICD-10-CM

## 2023-11-07 DIAGNOSIS — S22080A Wedge compression fracture of T11-T12 vertebra, initial encounter for closed fracture: Secondary | ICD-10-CM

## 2023-11-07 DIAGNOSIS — E162 Hypoglycemia, unspecified: Secondary | ICD-10-CM | POA: Diagnosis not present

## 2023-11-07 DIAGNOSIS — M549 Dorsalgia, unspecified: Secondary | ICD-10-CM | POA: Insufficient documentation

## 2023-11-07 MED ORDER — IBUPROFEN 800 MG PO TABS
800.0000 mg | ORAL_TABLET | Freq: Three times a day (TID) | ORAL | 2 refills | Status: DC | PRN
Start: 2023-11-07 — End: 2024-04-27

## 2023-11-07 MED ORDER — BENZONATATE 200 MG PO CAPS
200.0000 mg | ORAL_CAPSULE | Freq: Three times a day (TID) | ORAL | 0 refills | Status: AC | PRN
Start: 2023-11-07 — End: ?

## 2023-11-07 NOTE — Patient Instructions (Signed)

## 2023-11-07 NOTE — Assessment & Plan Note (Signed)
1 week of a mild cough, nonproductive, no fevers, chills, speaking full sentences, lungs are clear, suspect viral URI, I will add some Tessalon Perles and she can come back if not doing better in a week or 2.
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
## 2023-11-11 ENCOUNTER — Telehealth: Payer: Self-pay | Admitting: Sports Medicine

## 2023-11-11 NOTE — Telephone Encounter (Signed)
Worker's Comp. paperwork is usually quite extensive and an appointment is needed.

## 2023-11-11 NOTE — Telephone Encounter (Signed)
Patient dropped off document  [employer] surgeon , to be filled out by provider. Patient requested to send it back via Call Patient to pick up within 5-days. Document is located in providers tray at front office.Please advise at Mobile [phone] (mobile)   Patient stated the paperwork is due in the next few days. She just received the paperwork this week. Please advise.

## 2023-11-24 ENCOUNTER — Ambulatory Visit (INDEPENDENT_AMBULATORY_CARE_PROVIDER_SITE_OTHER): Payer: 59 | Admitting: Sports Medicine

## 2023-11-24 DIAGNOSIS — Z0000 Encounter for general adult medical examination without abnormal findings: Secondary | ICD-10-CM

## 2023-11-24 DIAGNOSIS — S22080D Wedge compression fracture of T11-T12 vertebra, subsequent encounter for fracture with routine healing: Secondary | ICD-10-CM | POA: Diagnosis not present

## 2023-11-24 DIAGNOSIS — I10 Essential (primary) hypertension: Secondary | ICD-10-CM

## 2023-11-24 DIAGNOSIS — Z23 Encounter for immunization: Secondary | ICD-10-CM
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
## 2023-11-24 NOTE — Addendum Note (Signed)
Addended by: Carren Rang A on: 11/24/2023 01:51 PM   Modules accepted: Orders

## 2023-11-24 NOTE — Patient Instructions (Signed)
Prevnar 20 and Shingrix No. 1 today Return in 2 months for Tdap and Shingrix No. 2 nurse visit
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
## 2023-11-24 NOTE — Assessment & Plan Note (Signed)
Due for Prevnar 20, Shingrix, Tdap, we will do Prevnar 20 and Shingrix No. 1 today, she will do a nurse visit in 2 months for Shingrix #2 and Tdap.

## 2023-12-01 ENCOUNTER — Ambulatory Visit (INDEPENDENT_AMBULATORY_CARE_PROVIDER_SITE_OTHER): Payer: 59 | Admitting: Sports Medicine

## 2023-12-01 DIAGNOSIS — G9341 Metabolic encephalopathy: Secondary | ICD-10-CM | POA: Diagnosis not present

## 2023-12-01 DIAGNOSIS — E162 Hypoglycemia, unspecified: Secondary | ICD-10-CM

## 2023-12-01 DIAGNOSIS — S22080D Wedge compression fracture of T11-T12 vertebra, subsequent encounter for fracture with routine healing: Secondary | ICD-10-CM

## 2023-12-01 MED ORDER — DEXCOM G7 RECEIVER DEVI
0 refills | Status: AC
Start: 2023-12-01 — End: ?

## 2023-12-01 MED ORDER — DEXCOM G7 SENSOR MISC
11 refills | Status: AC
Start: 2023-12-01 — End: ?

## 2023-12-01 NOTE — Assessment & Plan Note (Signed)
Episodes of hypoglycemic encephalopathy, also on further discussion with son concern for progressive loss of mental status. They are requesting evaluation for dementia, I think this is appropriate, proceeding with neurology referral.
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
## 2023-12-02 ENCOUNTER — Encounter: Payer: Self-pay | Admitting: Physician Assistant

## 2023-12-05 ENCOUNTER — Ambulatory Visit: Payer: 59 | Admitting: Sports Medicine

## 2023-12-22 ENCOUNTER — Encounter: Payer: Self-pay | Admitting: Family

## 2023-12-23 ENCOUNTER — Encounter: Payer: Self-pay | Admitting: Family

## 2023-12-29 ENCOUNTER — Ambulatory Visit: Payer: 59 | Admitting: Sports Medicine

## 2023-12-30 ENCOUNTER — Encounter: Payer: Self-pay | Admitting: Family

## 2024-01-06 ENCOUNTER — Encounter: Payer: 59 | Admitting: Physician Assistant

## 2024-01-06 ENCOUNTER — Ambulatory Visit (INDEPENDENT_AMBULATORY_CARE_PROVIDER_SITE_OTHER): Payer: 59 | Admitting: Sports Medicine

## 2024-01-06 ENCOUNTER — Encounter: Payer: Self-pay | Admitting: Physician Assistant

## 2024-01-06 DIAGNOSIS — M47816 Spondylosis without myelopathy or radiculopathy, lumbar region: Secondary | ICD-10-CM

## 2024-01-06 DIAGNOSIS — E162 Hypoglycemia, unspecified: Secondary | ICD-10-CM

## 2024-01-06 DIAGNOSIS — Z029 Encounter for administrative examinations, unspecified: Secondary | ICD-10-CM
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
## 2024-01-06 NOTE — Assessment & Plan Note (Signed)
Lumbar DDD noted on x-rays, spondylolisthesis, thoracic back pain has improved, now having lumbar back pain, I think this is related to the underlying disc disease and facet arthritis, adding formal PT, return to see me in 6 weeks for this.

## 2024-01-22 ENCOUNTER — Ambulatory Visit: Payer: 59

## 2024-01-26 ENCOUNTER — Encounter: Payer: Self-pay | Admitting: Physical Therapy

## 2024-01-26 ENCOUNTER — Other Ambulatory Visit: Payer: Self-pay

## 2024-01-26 ENCOUNTER — Ambulatory Visit: Payer: 59 | Attending: Sports Medicine | Admitting: Physical Therapy

## 2024-01-26 DIAGNOSIS — M546 Pain in thoracic spine: Secondary | ICD-10-CM | POA: Diagnosis present

## 2024-01-26 DIAGNOSIS — M6281 Muscle weakness (generalized): Secondary | ICD-10-CM | POA: Insufficient documentation

## 2024-01-26 DIAGNOSIS — M5459 Other low back pain: Secondary | ICD-10-CM | POA: Diagnosis present

## 2024-01-26 DIAGNOSIS — M47816 Spondylosis without myelopathy or radiculopathy, lumbar region: Secondary | ICD-10-CM | POA: Insufficient documentation

## 2024-01-26 DIAGNOSIS — R2681 Unsteadiness on feet: Secondary | ICD-10-CM | POA: Insufficient documentation
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
## 2024-01-26 NOTE — Patient Instructions (Signed)
 Access Code: [email or web address] URL: [email or web address] Date: 01/26/2024 Prepared by: Leah Primus  Exercises - Supine Lower Trunk Rotation  - 1-2 x daily - 10 reps - 5 seconds hold - Supine Posterior Pelvic Tilt  - 1-2 x daily - 2 sets - 10 reps - 5 seconds hold - Sidelying Mid Thoracic Rotation  - 1-2 x daily - 10 reps - 5 seconds hold - Seated Scapular Retraction  - 1-2 x daily - 2 sets - 10 reps - 5 seconds hold - Seated Thoracic Lumbar Extension  - 1-2 x daily - 10 reps - 5 seconds hold
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
## 2024-01-29 ENCOUNTER — Other Ambulatory Visit: Payer: 59

## 2024-01-29 ENCOUNTER — Ambulatory Visit (INDEPENDENT_AMBULATORY_CARE_PROVIDER_SITE_OTHER): Payer: 59 | Admitting: Physician Assistant

## 2024-01-29 ENCOUNTER — Ambulatory Visit: Payer: 59

## 2024-01-29 ENCOUNTER — Encounter: Payer: Self-pay | Admitting: Physician Assistant

## 2024-01-29 VITALS — BP 157/81 | HR 67 | Resp 20 | Wt 119.0 lb

## 2024-01-29 DIAGNOSIS — R569 Unspecified convulsions: Secondary | ICD-10-CM

## 2024-01-29 DIAGNOSIS — R413 Other amnesia: Secondary | ICD-10-CM | POA: Insufficient documentation

## 2024-01-29 DIAGNOSIS — E162 Hypoglycemia, unspecified: Secondary | ICD-10-CM | POA: Diagnosis not present

## 2024-01-29 NOTE — Patient Instructions (Addendum)
It was a pleasure to see you today at our office.   Recommendations:  MRI of the brain, the radiology office will call you to arrange you appointment  at [facility] [phone] Check labs today [address] Follow up in  2 month EEG to check for seizures one hour   For psychiatric meds, mood meds: Please have your primary care physician manage these medications.  If you have any severe symptoms of a stroke, or other severe issues such as confusion,severe chills or fever, etc call 911 or go to the ER as you may need to be evaluated further   For guidance regarding [facility] and if placement were needed at the facility, contact Social Worker tel: [phone]  For assessment of decision of mental capacity and competency:  Call Dr. Erick Blinks, geriatric psychiatrist at [phone]  Counseling regarding caregiver distress, including caregiver depression, anxiety and issues regarding community resources, adult day care programs, adult living facilities, or memory care questions:  please contact your  Primary Doctor's Social Worker   Whom to call: Memory  decline, memory medications: Call our office [phone]    [email or web address]   RECOMMENDATIONS FOR ALL PATIENTS WITH MEMORY PROBLEMS: 1. Continue to exercise (Recommend 30 minutes of walking everyday, or 3 hours every week) 2. Increase social interactions - continue going to [place] and enjoy social gatherings with friends and family 3. Eat healthy, avoid fried foods and eat more fruits and vegetables 4. Maintain adequate blood pressure, blood sugar, and blood cholesterol level. Reducing the risk of stroke and cardiovascular disease also helps promoting better memory. 5. Avoid stressful situations. Live a simple life and avoid aggravations. Organize your time and prepare for the next day in anticipation. 6. Sleep well, avoid any interruptions of sleep  and avoid any distractions in the bedroom that may interfere with adequate sleep quality 7. Avoid sugar, avoid sweets as there is a strong link between excessive sugar intake, diabetes, and cognitive impairment We discussed the Mediterranean diet, which has been shown to help patients reduce the risk of progressive memory disorders and reduces cardiovascular risk. This includes eating fish, eat fruits and green leafy vegetables, nuts like almonds and hazelnuts, walnuts, and also use olive oil. Avoid fast foods and fried foods as much as possible. Avoid sweets and sugar as sugar use has been linked to worsening of memory function.  There is always a concern of gradual progression of memory problems. If this is the case, then we may need to adjust level of care according to patient needs. Support, both to the patient and caregiver, should then be put into place.      1. If medication has been prescribed for you to prevent seizures, take it exactly as directed.  Do not stop taking the medicine without talking to your doctor first, even if you have not had a seizure in a long time.   2. Avoid activities in which a seizure would cause danger to yourself or to others.  Don't operate dangerous machinery, swim alone, or climb in high or dangerous places, such as on ladders, roofs, or girders.  Do not drive unless your doctor says you may.  3. If you have any warning that you may have a seizure, lay down in a safe place where you can't hurt yourself.    4.  No driving for 6 months from last seizure, as per [facility]   Please refer to the following link on the Epilepsy Foundation  of America's website for more information: http://www.epilepsyfoundation.org/answerplace/Social/driving/drivingu.cfm   5.  Maintain good sleep hygiene.  6.  Notify your neurologist if you are planning pregnancy or if you become pregnant.  7.  Contact your doctor if you have any problems that may be related to the  medicine you are taking.  8.  Call 911 and bring the patient back to the ED if:        A.  The seizure lasts longer than 5 minutes.       B.  The patient doesn't awaken shortly after the seizure  C.  The patient has new problems such as difficulty seeing, speaking or moving  D.  The patient was injured during the seizure  E.  The patient has a temperature over 102 F (39C)  F.  The patient vomited and now is having trouble breathing          FALL PRECAUTIONS: Be cautious when walking. Scan the area for obstacles that may increase the risk of trips and falls. When getting up in the mornings, sit up at the edge of the bed for a few minutes before getting out of bed. Consider elevating the bed at the head end to avoid drop of blood pressure when getting up. Walk always in a well-lit room (use night lights in the walls). Avoid area rugs or power cords from appliances in the middle of the walkways. Use a walker or a cane if necessary and consider physical therapy for balance exercise. Get your eyesight checked regularly.  FINANCIAL OVERSIGHT: Supervision, especially oversight when making financial decisions or transactions is also recommended.  HOME SAFETY: Consider the safety of the kitchen when operating appliances like stoves, microwave oven, and blender. Consider having supervision and share cooking responsibilities until no longer able to participate in those. Accidents with firearms and other hazards in the house should be identified and addressed as well.   ABILITY TO BE LEFT ALONE: If patient is unable to contact 911 operator, consider using LifeLine, or when the need is there, arrange for someone to stay with patients. Smoking is a fire hazard, consider supervision or cessation. Risk of wandering should be assessed by caregiver and if detected at any point, supervision and safe proof recommendations should be instituted.  MEDICATION SUPERVISION: Inability to self-administer medication needs  to be constantly addressed. Implement a mechanism to ensure safe administration of the medications.      Mediterranean Diet A Mediterranean diet refers to food and lifestyle choices that are based on the traditions of countries located on the [employer]. This way of eating has been shown to help prevent certain conditions and improve outcomes for people who have chronic diseases, like kidney disease and heart disease. What are tips for following this plan? Lifestyle  Cook and eat meals together with your family, when possible. Drink enough fluid to keep your urine clear or pale yellow. Be physically active every day. This includes: Aerobic exercise like running or swimming. Leisure activities like gardening, walking, or housework. Get 7-8 hours of sleep each night. If recommended by your health care provider, drink red wine in moderation. This means 1 glass a day for nonpregnant women and 2 glasses a day for men. A glass of wine equals 5 oz (150 mL). Reading food labels  Check the serving size of packaged foods. For foods such as rice and pasta, the serving size refers to the amount of cooked product, not dry. Check the total fat in packaged foods. Avoid  foods that have saturated fat or trans fats. Check the ingredients list for added sugars, such as corn syrup. Shopping  At the grocery store, buy most of your food from the areas near the walls of the store. This includes: Fresh fruits and vegetables (produce). Grains, beans, nuts, and seeds. Some of these may be available in unpackaged forms or large amounts (in bulk). Fresh seafood. Poultry and eggs. Low-fat dairy products. Buy whole ingredients instead of prepackaged foods. Buy fresh fruits and vegetables in-season from local farmers markets. Buy frozen fruits and vegetables in resealable bags. If you do not have access to quality fresh seafood, buy precooked frozen shrimp or canned fish, such as tuna, salmon, or  sardines. Buy small amounts of raw or cooked vegetables, salads, or olives from the deli or salad bar at your store. Stock your pantry so you always have certain foods on hand, such as olive oil, canned tuna, canned tomatoes, rice, pasta, and beans. Cooking  Cook foods with extra-virgin olive oil instead of using butter or other vegetable oils. Have meat as a side dish, and have vegetables or grains as your main dish. This means having meat in small portions or adding small amounts of meat to foods like pasta or stew. Use beans or vegetables instead of meat in common dishes like chili or lasagna. Experiment with different cooking methods. Try roasting or broiling vegetables instead of steaming or sauteing them. Add frozen vegetables to soups, stews, pasta, or rice. Add nuts or seeds for added healthy fat at each meal. You can add these to yogurt, salads, or vegetable dishes. Marinate fish or vegetables using olive oil, lemon juice, garlic, and fresh herbs. Meal planning  Plan to eat 1 vegetarian meal one day each week. Try to work up to 2 vegetarian meals, if possible. Eat seafood 2 or more times a week. Have healthy snacks readily available, such as: Vegetable sticks with hummus. Greek yogurt. Fruit and nut trail mix. Eat balanced meals throughout the week. This includes: Fruit: 2-3 servings a day Vegetables: 4-5 servings a day Low-fat dairy: 2 servings a day Fish, poultry, or lean meat: 1 serving a day Beans and legumes: 2 or more servings a week Nuts and seeds: 1-2 servings a day Whole grains: 6-8 servings a day Extra-virgin olive oil: 3-4 servings a day Limit red meat and sweets to only a few servings a month What are my food choices? Mediterranean diet Recommended Grains: Whole-grain pasta. Brown rice. Bulgar wheat. Polenta. Couscous. Whole-wheat bread. Orpah Cobb. Vegetables: Artichokes. Beets. Broccoli. Cabbage. Carrots. Eggplant. Green beans. Chard. Kale. Spinach.  Onions. Leeks. Peas. Squash. Tomatoes. Peppers. Radishes. Fruits: Apples. Apricots. Avocado. Berries. Bananas. Cherries. Dates. Figs. Grapes. Lemons. Melon. Oranges. Peaches. Plums. Pomegranate. Meats and other protein foods: Beans. Almonds. Sunflower seeds. Pine nuts. Peanuts. Cod. Salmon. Scallops. Shrimp. Tuna. Tilapia. Clams. Oysters. Eggs. Dairy: Low-fat milk. Cheese. Greek yogurt. Beverages: Water. Red wine. Herbal tea. Fats and oils: Extra virgin olive oil. Avocado oil. Grape seed oil. Sweets and desserts: [place] yogurt with honey. Baked apples. Poached pears. Trail mix. Seasoning and other foods: Basil. Cilantro. Coriander. Cumin. Mint. Parsley. Sage. Rosemary. Tarragon. Garlic. Oregano. Thyme. Pepper. Balsalmic vinegar. Tahini. Hummus. Tomato sauce. Olives. Mushrooms. Limit these Grains: Prepackaged pasta or rice dishes. Prepackaged cereal with added sugar. Vegetables: Deep fried potatoes (french fries). Fruits: Fruit canned in syrup. Meats and other protein foods: Beef. Pork. Lamb. Poultry with skin. Hot dogs. Tomasa Blase. Dairy: Ice cream. Sour cream. Whole milk. Beverages: Juice. Sugar-sweetened soft  drinks. Beer. Liquor and spirits. Fats and oils: Butter. Canola oil. Vegetable oil. Beef fat (tallow). Lard. Sweets and desserts: Cookies. Cakes. Pies. Candy. Seasoning and other foods: Mayonnaise. Premade sauces and marinades. The items listed may not be a complete list. Talk with your dietitian about what dietary choices are right for you. Summary The Mediterranean diet includes both food and lifestyle choices. Eat a variety of fresh fruits and vegetables, beans, nuts, seeds, and whole grains. Limit the amount of red meat and sweets that you eat. Talk with your health care provider about whether it is safe for you to drink red wine in moderation. This means 1 glass a day for nonpregnant women and 2 glasses a day for men. A glass of wine equals 5 oz (150 mL). This information is not  intended to replace advice given to you by your health care provider. Make sure you discuss any questions you have with your health care provider. Document Released: 07/25/2016 Document Revised: 08/27/2016 Document Reviewed: 07/25/2016 Elsevier Interactive Patient Education  2017 [employer].

## 2024-01-30 LAB — TSH: TSH: 4.49 m[IU]/L (ref 0.40–4.50)

## 2024-01-30 LAB — VITAMIN B12: Vitamin B-12: 824 pg/mL (ref 200–1100)

## 2024-01-30 NOTE — Progress Notes (Signed)
B12 and thyroid levels normal, thanks

## 2024-02-03 ENCOUNTER — Ambulatory Visit: Payer: 59 | Admitting: Physical Therapy

## 2024-02-03 ENCOUNTER — Encounter: Payer: Self-pay | Admitting: Physical Therapy

## 2024-02-03 DIAGNOSIS — M6281 Muscle weakness (generalized): Secondary | ICD-10-CM

## 2024-02-03 DIAGNOSIS — M5459 Other low back pain: Secondary | ICD-10-CM | POA: Diagnosis not present

## 2024-02-03 DIAGNOSIS — M546 Pain in thoracic spine: Secondary | ICD-10-CM

## 2024-02-03 DIAGNOSIS — R2681 Unsteadiness on feet: Secondary | ICD-10-CM
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
## 2024-02-04 ENCOUNTER — Other Ambulatory Visit: Payer: 59

## 2024-02-05 ENCOUNTER — Ambulatory Visit (INDEPENDENT_AMBULATORY_CARE_PROVIDER_SITE_OTHER): Payer: 59 | Admitting: Neurology

## 2024-02-05 DIAGNOSIS — R569 Unspecified convulsions: Secondary | ICD-10-CM

## 2024-02-05 DIAGNOSIS — E162 Hypoglycemia, unspecified: Secondary | ICD-10-CM

## 2024-02-05 NOTE — Progress Notes (Signed)
 EEG complete - results pending

## 2024-02-10 ENCOUNTER — Other Ambulatory Visit: Payer: Self-pay

## 2024-02-10 ENCOUNTER — Ambulatory Visit: Payer: 59 | Admitting: Physical Therapy

## 2024-02-10 ENCOUNTER — Encounter: Payer: Self-pay | Admitting: Physical Therapy

## 2024-02-10 DIAGNOSIS — M5459 Other low back pain: Secondary | ICD-10-CM

## 2024-02-10 DIAGNOSIS — M6281 Muscle weakness (generalized): Secondary | ICD-10-CM

## 2024-02-10 DIAGNOSIS — M546 Pain in thoracic spine: Secondary | ICD-10-CM
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
## 2024-02-11 NOTE — Procedures (Signed)
 ELECTROENCEPHALOGRAM REPORT  Date of Study: 02/05/2024  Patient's Name: [name] MRN: [id number] Date of Birth: [birth year]  Referring Provider: Marlowe Kays, PA-C  Clinical History: This is a 68 year old woman with seizures in the setting of hypoglycemia. EEG for classification.  CNS Active Medications: mirtazapine  Technical Summary: A multichannel digital 1-hour EEG recording measured by the international 10-20 system with electrodes applied with paste and impedances below 5000 ohms performed in our laboratory with EKG monitoring in an awake and asleep patient.  Hyperventilation was not performed. Photic stimulation was performed.  The digital EEG was referentially recorded, reformatted, and digitally filtered in a variety of bipolar and referential montages for optimal display.    Description: The patient is awake and asleep during the recording.  During maximal wakefulness, there is a symmetric, medium voltage 8 Hz posterior dominant rhythm that attenuates with eye opening.  The record is symmetric.  During drowsiness and sleep, there is an increase in theta slowing of the background.  Vertex waves and symmetric sleep spindles were seen. Photic stimulation did not elicit any abnormalities.  There were no epileptiform discharges or electrographic seizures seen.    EKG lead was unremarkable.  Impression: This 1-hour awake and asleep EEG is normal.    Clinical Correlation: A normal EEG does not exclude a clinical diagnosis of epilepsy.  If further clinical questions remain, prolonged EEG may be helpful.  Clinical correlation is advised.   Patrcia Dolly, M.D.

## 2024-02-11 NOTE — Progress Notes (Signed)
 EEG is normal, no seizure thanks

## 2024-02-11 NOTE — Addendum Note (Signed)
 Addended by: Van Clines on: 02/11/2024 03:14 PM   Modules accepted: Orders

## 2024-02-12 ENCOUNTER — Ambulatory Visit: Payer: 59 | Admitting: Physical Therapy

## 2024-02-12 ENCOUNTER — Encounter: Payer: Self-pay | Admitting: Physical Therapy

## 2024-02-12 DIAGNOSIS — M5459 Other low back pain: Secondary | ICD-10-CM | POA: Diagnosis not present

## 2024-02-12 DIAGNOSIS — M546 Pain in thoracic spine: Secondary | ICD-10-CM
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
## 2024-02-14 ENCOUNTER — Telehealth: Payer: Self-pay

## 2024-02-14 NOTE — Telephone Encounter (Signed)
 Prior auth for: [facility] G7 SENSOR Determination: Pending as of 02/14/24 Auth #: [id number] Valid from: n/a

## 2024-02-14 NOTE — Telephone Encounter (Signed)
 Prior auth for: [facility] G7 RECEIVER  Determination: Pending as of 02/14/24 Auth #: [id number] Valid from: N/A

## 2024-02-17 ENCOUNTER — Other Ambulatory Visit: Payer: Self-pay

## 2024-02-17 ENCOUNTER — Ambulatory Visit: Payer: 59 | Attending: Sports Medicine | Admitting: Physical Therapy

## 2024-02-17 ENCOUNTER — Encounter: Payer: Self-pay | Admitting: Physical Therapy

## 2024-02-17 DIAGNOSIS — M5459 Other low back pain: Secondary | ICD-10-CM | POA: Insufficient documentation

## 2024-02-17 DIAGNOSIS — M546 Pain in thoracic spine: Secondary | ICD-10-CM | POA: Diagnosis present

## 2024-02-17 DIAGNOSIS — R2681 Unsteadiness on feet: Secondary | ICD-10-CM | POA: Insufficient documentation

## 2024-02-17 DIAGNOSIS — M6281 Muscle weakness (generalized): Secondary | ICD-10-CM | POA: Diagnosis present

## 2024-02-17 NOTE — Telephone Encounter (Signed)
 [facility] has been trying to reach patient regarding MRI appt. Patient stated,they would call back to have done". Multiple attempts made
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
## 2024-02-19 ENCOUNTER — Encounter: Payer: Self-pay | Admitting: Sports Medicine

## 2024-02-19 ENCOUNTER — Telehealth: Payer: Self-pay | Admitting: Physical Therapy

## 2024-02-19 ENCOUNTER — Ambulatory Visit

## 2024-02-19 ENCOUNTER — Ambulatory Visit: Payer: 59 | Admitting: Physical Therapy

## 2024-02-19 ENCOUNTER — Ambulatory Visit (INDEPENDENT_AMBULATORY_CARE_PROVIDER_SITE_OTHER): Payer: 59 | Admitting: Sports Medicine

## 2024-02-19 DIAGNOSIS — M50322 Other cervical disc degeneration at C5-C6 level: Secondary | ICD-10-CM

## 2024-02-19 DIAGNOSIS — M47812 Spondylosis without myelopathy or radiculopathy, cervical region: Secondary | ICD-10-CM | POA: Diagnosis not present

## 2024-02-19 DIAGNOSIS — M50321 Other cervical disc degeneration at C4-C5 level: Secondary | ICD-10-CM

## 2024-02-19 DIAGNOSIS — Z0000 Encounter for general adult medical examination without abnormal findings: Secondary | ICD-10-CM | POA: Diagnosis not present

## 2024-02-19 DIAGNOSIS — M47816 Spondylosis without myelopathy or radiculopathy, lumbar region: Secondary | ICD-10-CM

## 2024-02-19 DIAGNOSIS — M50323 Other cervical disc degeneration at C6-C7 level: Secondary | ICD-10-CM

## 2024-02-19 NOTE — Assessment & Plan Note (Signed)
 Bilateral trapezial pain, neck pain, adding x-rays, home PT, return to see me as needed for this.

## 2024-02-19 NOTE — Assessment & Plan Note (Signed)
 Lumbar DDD, spondylolisthesis, thoracic back pain improved, lumbar back pain is improved considerably with physical therapy She still has some discomfort but can live with it, she will continue therapy formally for the next 2 sessions and then graduate to a home exercise program.

## 2024-02-19 NOTE — Telephone Encounter (Signed)
 Attempted to contact patient and spoke with her son. Asked her son to have the patient contact the clinic regarding missed PT appointment.   Rosana Hoes, PT, DPT, LAT, ATC 02/19/24  1:40 PM Phone: [phone] Fax: [phone]

## 2024-02-19 NOTE — Progress Notes (Signed)
    Procedures performed today:    None.  Independent interpretation of notes and tests performed by another provider:   None.  Brief History, Exam, Impression, and Recommendations:    Cervical spondylosis Bilateral trapezial pain, neck pain, adding x-rays, home PT, return to see me as needed for this.  Lumbar spondylosis Lumbar DDD, spondylolisthesis, thoracic back pain improved, lumbar back pain is improved considerably with physical therapy She still has some discomfort but can live with it, she will continue therapy formally for the next 2 sessions and then graduate to a home exercise program.  Annual physical exam We did Prevnar 20 and Shingrix No. 1 at her last visit, she was supposed to come back for a nurse visit for Shingrix No. 2 and Tdap but failed to do this, we will get this knocked out at her physical. Return in 6 months for fasting annual physical.  I spent 30 minutes of total time managing this patient today, this includes chart review, face to face, and non-face to face time.  ____________________________________________ Ihor Austin. Benjamin Stain, M.D., ABFM., CAQSM., AME. Primary Care and Sports Medicine [facility]  Adjunct Professor of Family Medicine  [employer]  Restaurant manager, fast food

## 2024-02-19 NOTE — Assessment & Plan Note (Signed)
 We did Prevnar 20 and Shingrix No. 1 at her last visit, she was supposed to come back for a nurse visit for Shingrix No. 2 and Tdap but failed to do this, we will get this knocked out at her physical. Return in 6 months for fasting annual physical.

## 2024-02-24 ENCOUNTER — Other Ambulatory Visit: Payer: Self-pay

## 2024-02-24 ENCOUNTER — Encounter: Payer: Self-pay | Admitting: Physical Therapy

## 2024-02-24 ENCOUNTER — Ambulatory Visit: Payer: 59 | Admitting: Physical Therapy

## 2024-02-24 DIAGNOSIS — M6281 Muscle weakness (generalized): Secondary | ICD-10-CM

## 2024-02-24 DIAGNOSIS — M546 Pain in thoracic spine: Secondary | ICD-10-CM

## 2024-02-24 DIAGNOSIS — M5459 Other low back pain: Secondary | ICD-10-CM
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
## 2024-02-26 ENCOUNTER — Encounter: Payer: Self-pay | Admitting: Physical Therapy

## 2024-02-26 ENCOUNTER — Ambulatory Visit: Payer: 59 | Admitting: Physical Therapy

## 2024-02-26 DIAGNOSIS — R2681 Unsteadiness on feet: Secondary | ICD-10-CM

## 2024-02-26 DIAGNOSIS — M546 Pain in thoracic spine: Secondary | ICD-10-CM

## 2024-02-26 DIAGNOSIS — M5459 Other low back pain: Secondary | ICD-10-CM

## 2024-02-26 DIAGNOSIS — M6281 Muscle weakness (generalized): Secondary | ICD-10-CM
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
## 2024-03-02 ENCOUNTER — Encounter: Admitting: Physical Therapy

## 2024-03-02 ENCOUNTER — Ambulatory Visit

## 2024-03-02 DIAGNOSIS — M546 Pain in thoracic spine: Secondary | ICD-10-CM

## 2024-03-02 DIAGNOSIS — M5459 Other low back pain: Secondary | ICD-10-CM

## 2024-03-02 DIAGNOSIS — M6281 Muscle weakness (generalized): Secondary | ICD-10-CM
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
## 2024-03-04 ENCOUNTER — Ambulatory Visit

## 2024-03-04 ENCOUNTER — Encounter

## 2024-03-04 DIAGNOSIS — M546 Pain in thoracic spine: Secondary | ICD-10-CM

## 2024-03-04 DIAGNOSIS — M5459 Other low back pain: Secondary | ICD-10-CM
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
## 2024-03-09 ENCOUNTER — Ambulatory Visit

## 2024-03-09 ENCOUNTER — Encounter: Admitting: Physical Therapy

## 2024-03-09 DIAGNOSIS — M5459 Other low back pain: Secondary | ICD-10-CM | POA: Diagnosis not present

## 2024-03-09 DIAGNOSIS — M6281 Muscle weakness (generalized): Secondary | ICD-10-CM

## 2024-03-09 DIAGNOSIS — M546 Pain in thoracic spine: Secondary | ICD-10-CM
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
## 2024-03-11 ENCOUNTER — Encounter: Admitting: Physical Therapy

## 2024-03-11 ENCOUNTER — Ambulatory Visit

## 2024-03-12 NOTE — Telephone Encounter (Signed)
 Prior auth for: [facility] G7 RECEIVER  Determination: DENIED Auth #: [id number] Valid from: N/A  Prior auth for: [facility] G7 SENSOR Determination: Pending as of 02/14/24 Auth #Alita Chyle / [id number] Valid from: n/a

## 2024-03-16 ENCOUNTER — Ambulatory Visit: Attending: Sports Medicine

## 2024-03-16 DIAGNOSIS — M6281 Muscle weakness (generalized): Secondary | ICD-10-CM | POA: Insufficient documentation

## 2024-03-16 DIAGNOSIS — M546 Pain in thoracic spine: Secondary | ICD-10-CM | POA: Diagnosis present

## 2024-03-16 DIAGNOSIS — M5459 Other low back pain: Secondary | ICD-10-CM | POA: Diagnosis present
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
## 2024-03-18 ENCOUNTER — Encounter: Payer: Self-pay | Admitting: Physical Therapy

## 2024-03-18 ENCOUNTER — Ambulatory Visit: Admitting: Physical Therapy

## 2024-03-18 DIAGNOSIS — M546 Pain in thoracic spine: Secondary | ICD-10-CM

## 2024-03-18 DIAGNOSIS — M5459 Other low back pain: Secondary | ICD-10-CM

## 2024-03-18 DIAGNOSIS — M6281 Muscle weakness (generalized): Secondary | ICD-10-CM
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
## 2024-03-19 ENCOUNTER — Encounter: Payer: Self-pay | Admitting: Physician Assistant

## 2024-03-19 ENCOUNTER — Ambulatory Visit (INDEPENDENT_AMBULATORY_CARE_PROVIDER_SITE_OTHER): Payer: 59 | Admitting: Physician Assistant

## 2024-03-19 VITALS — BP 116/67 | HR 65 | Resp 20 | Wt 116.0 lb

## 2024-03-19 DIAGNOSIS — R413 Other amnesia: Secondary | ICD-10-CM
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
## 2024-03-23 ENCOUNTER — Ambulatory Visit

## 2024-03-23 DIAGNOSIS — M6281 Muscle weakness (generalized): Secondary | ICD-10-CM

## 2024-03-23 DIAGNOSIS — M546 Pain in thoracic spine: Secondary | ICD-10-CM

## 2024-03-23 DIAGNOSIS — M5459 Other low back pain: Secondary | ICD-10-CM | POA: Diagnosis not present
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
## 2024-03-24 ENCOUNTER — Other Ambulatory Visit: Payer: Self-pay | Admitting: Sports Medicine

## 2024-03-24 DIAGNOSIS — Z1231 Encounter for screening mammogram for malignant neoplasm of breast: Secondary | ICD-10-CM

## 2024-03-25 ENCOUNTER — Encounter: Payer: Self-pay | Admitting: Physical Therapy

## 2024-03-25 ENCOUNTER — Ambulatory Visit: Admitting: Physical Therapy

## 2024-03-25 DIAGNOSIS — M5459 Other low back pain: Secondary | ICD-10-CM

## 2024-03-25 DIAGNOSIS — M546 Pain in thoracic spine: Secondary | ICD-10-CM
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
## 2024-03-31 ENCOUNTER — Ambulatory Visit

## 2024-04-03 ENCOUNTER — Other Ambulatory Visit

## 2024-04-05 ENCOUNTER — Ambulatory Visit

## 2024-04-05 ENCOUNTER — Inpatient Hospital Stay: Admission: RE | Admit: 2024-04-05 | Source: Ambulatory Visit

## 2024-04-06 ENCOUNTER — Ambulatory Visit
Admission: RE | Admit: 2024-04-06 | Discharge: 2024-04-06 | Disposition: A | Discharge status: Home / Self Care | Source: Ambulatory Visit | Attending: Physician Assistant | Admitting: Physician Assistant

## 2024-04-07 NOTE — Progress Notes (Signed)
 MRI brain is similar to that of 01/2023. It showed mild hardening of the small blood vessels in the brain in patients with high cholesterol, blood pressure or sugar control issues, sometimes age as well, with some atrophy. It did not show any tumors, acute stroke Thanks

## 2024-04-08 ENCOUNTER — Ambulatory Visit
Admission: RE | Admit: 2024-04-08 | Discharge: 2024-04-08 | Disposition: A | Discharge status: Home / Self Care | Source: Ambulatory Visit | Attending: Sports Medicine | Admitting: Sports Medicine

## 2024-04-08 DIAGNOSIS — Z1231 Encounter for screening mammogram for malignant neoplasm of breast: Secondary | ICD-10-CM

## 2024-04-13 ENCOUNTER — Ambulatory Visit (INDEPENDENT_AMBULATORY_CARE_PROVIDER_SITE_OTHER): Admitting: Sports Medicine

## 2024-04-13 ENCOUNTER — Encounter: Payer: Self-pay | Admitting: Sports Medicine

## 2024-04-13 DIAGNOSIS — S22080D Wedge compression fracture of T11-T12 vertebra, subsequent encounter for fracture with routine healing: Secondary | ICD-10-CM | POA: Diagnosis not present

## 2024-04-13 NOTE — Progress Notes (Signed)
    Procedures performed today:    None.  Independent interpretation of notes and tests performed by another provider:   None.  Brief History, Exam, Impression, and Recommendations:    T12 compression fracture (HCC) T12 vertebral compression fracture back in November, she is doing well, she needed additional CA 17 Worker's Comp. paperwork filled out today, this was done, and the original was given back to her to deliver to her HR department. She is retired from the post office now.    ____________________________________________ Joselyn Nicely. Sandy Crumb, M.D., ABFM., CAQSM., AME. Primary Care and Sports Medicine [facility]  Adjunct Professor of [facility] Medicine  [employer]  Restaurant manager, fast food

## 2024-04-13 NOTE — Assessment & Plan Note (Signed)
 T12 vertebral compression fracture back in November, she is doing well, she needed additional CA 17 Worker's Comp. paperwork filled out today, this was done, and the original was given back to her to deliver to her HR department. She is retired from the post office now.

## 2024-04-27 ENCOUNTER — Ambulatory Visit (INDEPENDENT_AMBULATORY_CARE_PROVIDER_SITE_OTHER): Admitting: Sports Medicine

## 2024-04-27 ENCOUNTER — Telehealth: Payer: Self-pay

## 2024-04-27 DIAGNOSIS — E162 Hypoglycemia, unspecified: Secondary | ICD-10-CM | POA: Diagnosis not present

## 2024-04-27 DIAGNOSIS — M47812 Spondylosis without myelopathy or radiculopathy, cervical region: Secondary | ICD-10-CM | POA: Diagnosis not present

## 2024-04-27 DIAGNOSIS — M47816 Spondylosis without myelopathy or radiculopathy, lumbar region: Secondary | ICD-10-CM

## 2024-04-27 MED ORDER — MELOXICAM 15 MG PO TABS
ORAL_TABLET | ORAL | 3 refills | Status: AC
Start: 2024-04-27 — End: ?

## 2024-04-27 MED ORDER — ACETAMINOPHEN ER 650 MG PO TBCR
650.0000 mg | EXTENDED_RELEASE_TABLET | Freq: Three times a day (TID) | ORAL | Status: AC | PRN
Start: 2024-04-27 — End: ?
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
## 2024-04-27 NOTE — Telephone Encounter (Signed)
 No, I want her active and walking is much as possible.
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
## 2024-04-27 NOTE — Assessment & Plan Note (Signed)
 Chronic axial neck and periscapular pain, known cervical DDD on x-rays. She has done some formal therapy at the [facility] location, at least 6 weeks without sufficient improvement. We will proceed with MRI for injection planning. We will also switch from ibuprofen  to meloxicam  and arthritis Tylenol . Avoiding neuropathic agents such as gabapentin or Lyrica due to her history of altered mental status. The patient and her son [name] would like to follow-up to go over results before we order any epidural injections.

## 2024-04-27 NOTE — Assessment & Plan Note (Signed)
 Also with chronic axial low back pain, lumbar DDD with spondylolisthesis, she did have a history of vertebral compression fracture T12 back in November, this is healed. Has midline axial low back pain without radiculitis. She has done some formal therapy at the [facility] location, at least 6 weeks without sufficient improvement. We will proceed with MRI for injection planning. We will also switch from ibuprofen  to meloxicam  and arthritis Tylenol . Avoiding neuropathic agents such as gabapentin or Lyrica due to her history of altered mental status. The patient and her son [name] would like to follow-up to go over results before we order any epidural injections.

## 2024-04-27 NOTE — Telephone Encounter (Signed)
 Copied from CRM [id number]. Topic: General - Other >> Apr 27, 2024  3:16 PM Adrianna P wrote: Reason for CRM: pt wants to know if she can get a permanent haandicap sticker.

## 2024-04-28 NOTE — Telephone Encounter (Signed)
 Patient son [name] ( on [place] ) informed and will inform the patient.

## 2024-07-30 ENCOUNTER — Encounter: Payer: Self-pay | Admitting: Sports Medicine

## 2024-07-30 ENCOUNTER — Ambulatory Visit (INDEPENDENT_AMBULATORY_CARE_PROVIDER_SITE_OTHER): Admitting: Sports Medicine

## 2024-07-30 DIAGNOSIS — S22080D Wedge compression fracture of T11-T12 vertebra, subsequent encounter for fracture with routine healing: Secondary | ICD-10-CM

## 2024-07-30 NOTE — Assessment & Plan Note (Signed)
 This pleasant 68 year old female returns, she had a T12 vertebral compression fracture back in November 2024, she needed another CA 17 Worker's Comp. paperwork filled out today, this was done, original given back to the patient, notes from imaging and PT were also sent. She is retired from the post office, partially disabled.

## 2024-07-30 NOTE — Progress Notes (Signed)
    Procedures performed today:    None.  Independent interpretation of notes and tests performed by another provider:   None.  Brief History, Exam, Impression, and Recommendations:    T12 compression fracture [facility]) This pleasant 68 year old female returns, she had a T12 vertebral compression fracture back in November 2024, she needed another CA 17 Worker's Comp. paperwork filled out today, this was done, original given back to the patient, notes from imaging and PT were also sent. She is retired from the post office, partially disabled.    ____________________________________________ Debby PARAS. Curtis, M.D., ABFM., CAQSM., AME. Primary Care and Sports Medicine [facility]  Adjunct Professor of [facility] Medicine  [employer]  Restaurant manager, fast food

## 2024-08-17 ENCOUNTER — Encounter: Payer: Self-pay | Admitting: Sports Medicine

## 2024-08-23 ENCOUNTER — Encounter: Admitting: Urgent Care

## 2024-08-23 ENCOUNTER — Encounter: Admitting: Sports Medicine

## 2024-09-22 ENCOUNTER — Ambulatory Visit: Admitting: Physician Assistant
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
## 2025-01-26 ENCOUNTER — Ambulatory Visit: Admitting: Physician Assistant
